# Patient Record
Sex: Male | Born: 1945 | Race: White | Hispanic: No | Marital: Single | Smoking: Never smoker
Health system: Southern US, Community
[De-identification: ages and names within clinical notes are randomized; demographics above are authoritative.]

## PROBLEM LIST (undated history)

## (undated) DIAGNOSIS — C4491 Basal cell carcinoma of skin, unspecified: Secondary | ICD-10-CM

## (undated) DIAGNOSIS — I2510 Atherosclerotic heart disease of native coronary artery without angina pectoris: Secondary | ICD-10-CM

## (undated) DIAGNOSIS — M109 Gout, unspecified: Secondary | ICD-10-CM

## (undated) DIAGNOSIS — I10 Essential (primary) hypertension: Secondary | ICD-10-CM

## (undated) DIAGNOSIS — E785 Hyperlipidemia, unspecified: Secondary | ICD-10-CM

## (undated) HISTORY — DX: Hyperlipidemia, unspecified: E78.5

## (undated) HISTORY — DX: Essential (primary) hypertension: I10

## (undated) HISTORY — DX: Gout, unspecified: M10.9

## (undated) HISTORY — DX: Atherosclerotic heart disease of native coronary artery without angina pectoris: I25.10

---
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
## 1986-01-04 HISTORY — PX: CARDIAC CATHETERIZATION: SHX172

## 2003-10-03 ENCOUNTER — Emergency Department (HOSPITAL_COMMUNITY): Admission: EM | Admit: 2003-10-03 | Discharge: 2003-10-03 | Payer: Self-pay | Admitting: Emergency Medicine

## 2009-10-07 DIAGNOSIS — C4491 Basal cell carcinoma of skin, unspecified: Secondary | ICD-10-CM

## 2009-10-07 HISTORY — DX: Basal cell carcinoma of skin, unspecified: C44.91

## 2011-07-06 ENCOUNTER — Other Ambulatory Visit: Payer: Self-pay | Admitting: Family Medicine

## 2011-07-06 DIAGNOSIS — M545 Low back pain, unspecified: Secondary | ICD-10-CM

## 2011-07-14 ENCOUNTER — Ambulatory Visit
Admission: RE | Admit: 2011-07-14 | Discharge: 2011-07-14 | Disposition: A | Discharge status: Home / Self Care | Payer: 59 | Source: Ambulatory Visit | Attending: Family Medicine | Admitting: Family Medicine

## 2011-07-14 DIAGNOSIS — M545 Low back pain, unspecified: Secondary | ICD-10-CM

## 2011-08-01 ENCOUNTER — Emergency Department (HOSPITAL_COMMUNITY)
Admission: EM | Admit: 2011-08-01 | Discharge: 2011-08-01 | Disposition: A | Discharge status: Home / Self Care | Payer: 59 | Attending: Emergency Medicine | Admitting: Emergency Medicine

## 2013-04-25 ENCOUNTER — Other Ambulatory Visit (HOSPITAL_COMMUNITY): Payer: Self-pay | Admitting: Cardiovascular Disease

## 2013-04-25 DIAGNOSIS — I25810 Atherosclerosis of coronary artery bypass graft(s) without angina pectoris: Secondary | ICD-10-CM

## 2013-04-25 DIAGNOSIS — R011 Cardiac murmur, unspecified: Secondary | ICD-10-CM

## 2013-05-03 ENCOUNTER — Ambulatory Visit (HOSPITAL_COMMUNITY)
Admission: RE | Admit: 2013-05-03 | Discharge: 2013-05-03 | Disposition: A | Discharge status: Home / Self Care | Payer: 59 | Source: Ambulatory Visit | Attending: Cardiovascular Disease | Admitting: Cardiovascular Disease

## 2013-05-03 DIAGNOSIS — I25810 Atherosclerosis of coronary artery bypass graft(s) without angina pectoris: Secondary | ICD-10-CM

## 2013-05-03 DIAGNOSIS — R011 Cardiac murmur, unspecified: Secondary | ICD-10-CM | POA: Insufficient documentation

## 2013-05-03 DIAGNOSIS — I2510 Atherosclerotic heart disease of native coronary artery without angina pectoris: Secondary | ICD-10-CM | POA: Insufficient documentation

## 2013-05-03 NOTE — Progress Notes (Signed)
2D Echo Performed 05/03/2013    Jamorion Gomillion, RCS  

## 2013-05-04 ENCOUNTER — Encounter: Payer: Self-pay | Admitting: Cardiovascular Disease

## 2013-08-11 ENCOUNTER — Other Ambulatory Visit: Payer: Self-pay | Admitting: Orthopedic Surgery

## 2013-08-11 DIAGNOSIS — M25532 Pain in left wrist: Secondary | ICD-10-CM

## 2013-08-15 ENCOUNTER — Ambulatory Visit
Admission: RE | Admit: 2013-08-15 | Discharge: 2013-08-15 | Disposition: A | Discharge status: Home / Self Care | Payer: 59 | Source: Ambulatory Visit | Attending: Orthopedic Surgery | Admitting: Orthopedic Surgery

## 2013-08-15 DIAGNOSIS — M25532 Pain in left wrist: Secondary | ICD-10-CM

## 2014-05-01 ENCOUNTER — Other Ambulatory Visit: Payer: Self-pay

## 2014-05-01 MED ORDER — EZETIMIBE 10 MG PO TABS: 10.0000 mg | ORAL_TABLET | Freq: Every day | ORAL | Status: DC

## 2014-05-01 NOTE — Telephone Encounter (Signed)
Rx was sent to pharmacy electronically. Patient's last office visit - 04/25/2013 with Dr Rollene Fare.

## 2014-07-04 ENCOUNTER — Other Ambulatory Visit: Payer: Self-pay | Admitting: Cardiovascular Disease

## 2014-07-05 NOTE — Telephone Encounter (Signed)
Rx was sent to pharmacy electronically. Last OV 04/2013 with Dr. Rollene Fare

## 2014-08-30 ENCOUNTER — Other Ambulatory Visit: Payer: Self-pay | Admitting: *Deleted

## 2014-08-30 MED ORDER — ROSUVASTATIN CALCIUM 40 MG PO TABS: 40.0000 mg | ORAL_TABLET | Freq: Every day | ORAL | Status: DC

## 2014-08-30 NOTE — Telephone Encounter (Signed)
Medication electronically refilled. 

## 2014-09-05 ENCOUNTER — Encounter: Payer: Self-pay | Admitting: Cardiovascular Disease

## 2014-09-05 ENCOUNTER — Ambulatory Visit (INDEPENDENT_AMBULATORY_CARE_PROVIDER_SITE_OTHER): Payer: 59 | Admitting: Cardiovascular Disease

## 2014-09-05 VITALS — BP 132/90 | HR 71 | Ht 68.0 in | Wt 223.0 lb

## 2014-09-05 DIAGNOSIS — I2583 Coronary atherosclerosis due to lipid rich plaque: Secondary | ICD-10-CM

## 2014-09-05 DIAGNOSIS — E785 Hyperlipidemia, unspecified: Secondary | ICD-10-CM

## 2014-09-05 DIAGNOSIS — I2510 Atherosclerotic heart disease of native coronary artery without angina pectoris: Secondary | ICD-10-CM

## 2014-09-05 DIAGNOSIS — E669 Obesity, unspecified: Secondary | ICD-10-CM

## 2014-09-05 DIAGNOSIS — E7849 Other hyperlipidemia: Secondary | ICD-10-CM

## 2014-09-05 DIAGNOSIS — E782 Mixed hyperlipidemia: Secondary | ICD-10-CM

## 2014-09-05 DIAGNOSIS — I10 Essential (primary) hypertension: Secondary | ICD-10-CM

## 2014-09-05 DIAGNOSIS — M1000 Idiopathic gout, unspecified site: Secondary | ICD-10-CM

## 2014-09-05 MED ORDER — NITROGLYCERIN 0.4 MG SL SUBL: 0.4000 mg | SUBLINGUAL_TABLET | SUBLINGUAL | Status: DC | PRN

## 2014-09-05 MED ORDER — ROSUVASTATIN CALCIUM 40 MG PO TABS: 40.0000 mg | ORAL_TABLET | Freq: Every day | ORAL | Status: DC

## 2014-09-05 MED ORDER — NIACIN ER (ANTIHYPERLIPIDEMIC) 1000 MG PO TBCR: 2000.0000 mg | EXTENDED_RELEASE_TABLET | Freq: Every day | ORAL | Status: DC

## 2014-09-05 MED ORDER — EZETIMIBE 10 MG PO TABS
10.0000 mg | ORAL_TABLET | Freq: Every day | ORAL | Status: DC
Start: 2014-09-05 — End: 2015-08-19

## 2014-09-05 NOTE — Patient Instructions (Signed)
Your physician recommends that you return for fasting lab work in: December.  Your physician has requested that you have an exercise stress myoview. For further information please visit [email or web address] will be scheduled in December.  Your physician recommends that you schedule a follow-up appointment in: January 2016.

## 2014-09-08 DIAGNOSIS — I2510 Atherosclerotic heart disease of native coronary artery without angina pectoris: Secondary | ICD-10-CM | POA: Insufficient documentation

## 2014-09-08 DIAGNOSIS — E7849 Other hyperlipidemia: Secondary | ICD-10-CM | POA: Insufficient documentation

## 2014-09-08 DIAGNOSIS — M109 Gout, unspecified: Secondary | ICD-10-CM | POA: Insufficient documentation

## 2014-09-08 DIAGNOSIS — E669 Obesity, unspecified: Secondary | ICD-10-CM | POA: Insufficient documentation

## 2014-09-08 NOTE — Progress Notes (Signed)
Patient ID: [name] [name], male   DOB: [birth year], 68 y.o.   MRN: [id number]     PATIENT PROFILE: [name] [name] is a 68 y.o. male who presents to the office to establish cardiology care with me.  He is a former patient of Dr. Rollene Fare and last saw Dr. 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   HPI:  [name] [name] is a 68 y.o. male has a long-standing history of familial hyperlipidemia.  His father had familial hyperlipidemia as does one of his brothers.  His father died at age 40 secondary to advanced coronary artery disease.  [name] [name] is cholesterols were in excess of 400 .  He was initially diagnosed in the 1970s.  He tells me that he participated in numerous drug studies over the years and was one of the precipitants in the initial statin trials with Mevacor.  As part of one of his studies, he did undergo diagnostic cardiac catheterization in [place].  He was asymptomatic with chest pain but was found to have mild coronary obstructive disease with 20-40% RCA stenoses, 20-30% circumflex stenoses percent LAD stenosis with poststenotic dilatation involving the diagonal and septal perforating artery in 1987.  He has not been on medical therapy for CAD, with the exception of aggressive lipid-lowering treatment.  His last nuclear perfusion study was in 2012, which continue to show normal perfusion.  There was attenuation artifact.  Post-rest ejection fraction was 53%.  The patient tells me he has been aggressive with his follow-up.  He typically has laboratory checked via life extension on his own.  Remotely, he also underwent VAP testing to determine particles size and numbers.  Most recently he has been on Zetia 10 mg Crestor 40 mg, Niaspan 2000 mg and 2000 mg of omega-3 fatty acids.  I did review blood work that he had had in April 2015 from life extension and at that time on aggressive medical therapy.  His total cholesterol was 185, triglycerides 59, HDL cholesterol 82, LDL  cholesterol 91.  Homocysteine was 11.7.  Hemoglobin A1c was 5.7.  Thyroid function studies were normal.  He was not anemic.  He had normal renal function.  He tells me he is currently participating in a form quest study to look at the side effects of the pen injection for possible PCS canine inhibitor.  A 90 day safety study looking at affect at the infiltration site.  He states there is a chance he is getting placebo, one third chance of getting half dose and one third chance of getting full dose PCS canine inhibitor.  He does not know what drug he is taking.  His last injection is next week.  In June 2014.  He did undergo an echo Doppler study which showed an ejection fraction of 55-60%.  Normal diastolic parameters.  He had mild left atrial dilatation.  There was mild aortic valve sclerosis without stenosis.  He tells me his brother recently underwent stenting of his coronary artery age 38.  The patient also has a history of gout, and hypertension.  He has been tolerating my Cardis 80 mg without side effects.  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  He presents for evaluation.  He denies chest pain.  He denies PND, orthopnea.  He denies palpitations.  Past Medical History  Diagnosis Date  . Hypertension   . Hyperlipidemia   . Coronary artery disease     No past surgical history on file.  No Known  Allergies  Current Outpatient Prescriptions  Medication Sig Dispense Refill  . allopurinol (ZYLOPRIM) 300 MG tablet Take 1 tablet by mouth daily.      Marland Kitchen aspirin 325 MG tablet Take 325 mg by mouth daily.      . Cholecalciferol (VITAMIN D3) 10000 UNITS capsule Take 10,000 Units by mouth daily.      . Coenzyme Q10 (COQ10) 100 MG CAPS Take 1 capsule by mouth daily.      Marland Kitchen COLCRYS 0.6 MG tablet Take 1 tablet by mouth as needed.      . ezetimibe (ZETIA) 10 MG tablet Take 1 tablet (10 mg total) by mouth daily.  90 tablet  3  . Glucosamine-Chondroitin (GLUCOSAMINE CHONDR COMPLEX PO) Take 1 capsule  by mouth daily.      . nabumetone (RELAFEN) 500 MG tablet Take 1 tablet by mouth 2 (two) times daily.      . niacin (NIASPAN) 1000 MG CR tablet Take 2 tablets (2,000 mg total) by mouth at bedtime.  180 tablet  3  . nitroGLYCERIN (NITROSTAT) 0.4 MG SL tablet Place 1 tablet (0.4 mg total) under the tongue every 5 (five) minutes as needed for chest pain.  25 tablet  6  . Omega-3 Fatty Acids (OMEGA 3 PO) Take 2 capsules by mouth daily.      . rosuvastatin (CRESTOR) 40 MG tablet Take 1 tablet (40 mg total) by mouth daily.  90 tablet  3  . telmisartan (MICARDIS) 80 MG tablet Take 1 tablet by mouth daily.       No current facility-administered medications for this visit.    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  He is married and has 2 children, ages 96 and 39.  He has 2 grandchildren.  There is no history of tobacco use.  He does not drink alcohol.  Family history is notable that his mother died at age 72 with old age.  Father died at age 16 with advanced atherosclerosis.  He has one brother is status post coronary stenting.  ROS General: Negative; No fevers, chills, or night sweats HEENT: Negative; No changes in vision or hearing, sinus congestion, difficulty swallowing Pulmonary: Negative; No cough, wheezing, shortness of breath, hemoptysis Cardiovascular:  See HPI; No chest pain, presyncope, syncope, palpitations, edema GI: Negative; No nausea, vomiting, diarrhea, or abdominal pain GU: Negative; No dysuria, hematuria, or difficulty voiding Musculoskeletal: Negative; no myalgias, joint pain, or weakness Hematologic/Oncologic: Negative; no easy bruising, bleeding Neurologic: Positive for gout Endocrine: Negative; no heat/cold intolerance; no diabetes Neuro: Negative; no changes in balance, headaches Skin: Osteophytic for chronic rash  Psychiatric: Negative; No behavioral problems, depression Sleep: Negative; No daytime sleepiness,  hypersomnolence, bruxism, restless legs, hypnogagnic hallucinations Other comprehensive 14 point system review is negative   Physical Exam BP 132/90  Pulse 71  Ht 5\' 8"  (1.727 m)  Wt 223 lb (101.152 kg)  BMI 33.91 kg/m2 Mild obesity General: Alert, oriented, no distress.  Skin: normal turgor, no rashes, warm and dry HEENT: Normocephalic, atraumatic. Pupils equal round and reactive to light; sclera anicteric; extraocular muscles intact; Fundi normal vessels.  No hemorrhages or exudates.  Disks flat Nose without nasal septal hypertrophy Mouth/Parynx benign; Mallinpatti scale 2 Neck: No JVD, no carotid bruits; normal carotid upstroke Lungs: clear to ausculatation and percussion; no wheezing or rales Chest wall: without tenderness to palpitation Heart: PMI not displaced, RRR, s1 s2 normal, 1/6 systolic murmur, no diastolic murmur, no rubs, gallops, thrills, or  heaves Abdomen: soft, nontender; no hepatosplenomehaly, BS+; abdominal aorta nontender and not dilated by palpation. Back: no CVA tenderness Pulses 2+ Musculoskeletal: full range of motion, normal strength, no joint deformities Extremities: no clubbing cyanosis or edema, Homan's sign negative  Neurologic: grossly nonfocal; Cranial nerves grossly wnl Psychologic: Normal mood and affect   ECG (independently read by me): Normal sinus rhythm at 71 bpm.  No ectopy.  QTc interval 441 ms  LABS:  BMET No results found for this basename: na, k, cl, co2, glucose, bun, creatinine, calcium, gfrnonaa, gfraa     Hepatic Function Panel  No results found for this basename: prot, albumin, ast, alt, alkphos, bilitot, bilidir, ibili     CBC No results found for this basename: wbc, rbc, hgb, hct, plt, mcv, mch, mchc, rdw, neutrabs, lymphsabs, monoabs, eosabs, basosabs     BNP No results found for this basename: probnp    Lipid Panel  No results found for this basename: chol, trig, hdl, cholhdl, vldl, ldlcalc, ldldirect       RADIOLOGY: No results found.   ASSESSMENT AND PLAN: M, history r. Jahari Billy is a 68 year old gentleman who has a history of probable familial hyperlipidemia and was initially noted to have cholesterols in excess of 400 in the 1970s.  His father and brother also has similar history.  He states his 2 children do not have this disorder.  He has been on aggressive lipid-lowering therapy ever since.  Statins were initially introduced and had been participating in the early clinical trials.  Over the past several months, he has been participating in a form request trial and there is a possibility that he may be getting either a half dose or a full dose PCS canine inhibitor to assess for side effects due to the pen injection.  His last dose will be next week.  The most recent blood work was obtained in April prior to participating in that study.  I have recommended follow-up blood work be obtained in at least several months after his last injection so that we can re-ascertain where he is with reference to his lipid status.  He may very well be a candidate for PCS canine therapy on top of his multiple drug regimen since he does have established card artery disease and he may not able to be able to get at goal less than 70 without additional treatment.  His last stress test was 3-1/2 years ago.  I have recommended that we obtain an exercise Myoview study further evaluate his documented CAD, and I will schedule this for January.  In January when he gets his blood work.  We will also do NMR lipoprotein in addition to chemistry, CBC, TSH levels to assess particle size and number.  I will see him in the office in follow-up of the testing and further recommendations will be made at that time.  Time spent: 45 minutes   Troy Sine, MD, [facility] 09/08/2014 1:31 PM

## 2014-11-20 ENCOUNTER — Encounter: Payer: Self-pay | Admitting: Cardiovascular Disease

## 2014-11-28 ENCOUNTER — Other Ambulatory Visit: Payer: Self-pay | Admitting: Cardiovascular Disease

## 2014-11-28 NOTE — Telephone Encounter (Signed)
crestor refills #90 with 3 refills 09/05/14

## 2014-11-30 ENCOUNTER — Other Ambulatory Visit: Payer: Self-pay | Admitting: Cardiovascular Disease

## 2014-12-02 NOTE — Telephone Encounter (Signed)
Rx(s) sent to pharmacy electronically.  

## 2014-12-07 LAB — NMR, LIPOPROFILE
Cholesterol: 185 mg/dL (ref 100–199)
HDL Cholesterol by NMR: 94 mg/dL (ref >39)
HDL PARTICLE NUMBER: 41.4 umol/L (ref >=30.5)
LDL PARTICLE NUMBER: 683 nmol/L (ref <1000)
LDL SIZE: 21 nm (ref >20.5)
LDL-C: 77 mg/dL (ref 0–99)
LP-IR Score: 31 (ref <=45)
TRIGLYCERIDES BY NMR: 72 mg/dL (ref 0–149)

## 2014-12-07 LAB — CBC
HCT: 40.8 % (ref 37.5–51.0)
HEMOGLOBIN: 13.9 g/dL (ref 12.6–17.7)
MCH: 32.9 pg (ref 26.6–33.0)
MCHC: 34.1 g/dL (ref 31.5–35.7)
MCV: 97 fL (ref 79–97)
PLATELETS: 150 10*3/uL (ref 150–379)
RBC: 4.22 x10E6/uL (ref 4.14–5.80)
RDW: 15.1 % (ref 12.3–15.4)
WBC: 6.8 10*3/uL (ref 3.4–10.8)

## 2014-12-07 LAB — COMPREHENSIVE METABOLIC PANEL
ALT: 20 IU/L (ref 0–44)
AST: 28 IU/L (ref 0–40)
Albumin/Globulin Ratio: 2.2 (ref 1.1–2.5)
Albumin: 4.8 g/dL (ref 3.6–4.8)
Alkaline Phosphatase: 53 IU/L (ref 39–117)
BILIRUBIN TOTAL: 0.4 mg/dL (ref 0.0–1.2)
BUN/Creatinine Ratio: 17 (ref 10–22)
BUN: 17 mg/dL (ref 8–27)
CALCIUM: 9.6 mg/dL (ref 8.6–10.2)
CO2: 24 mmol/L (ref 18–29)
Chloride: 97 mmol/L (ref 97–108)
Creatinine, Ser: 0.98 mg/dL (ref 0.76–1.27)
GFR calc non Af Amer: 78 mL/min/{1.73_m2} (ref >59)
GFR, EST AFRICAN AMERICAN: 91 mL/min/{1.73_m2} (ref >59)
GLOBULIN, TOTAL: 2.2 g/dL (ref 1.5–4.5)
Glucose: 80 mg/dL (ref 65–99)
Potassium: 4.7 mmol/L (ref 3.5–5.2)
Sodium: 138 mmol/L (ref 134–144)
TOTAL PROTEIN: 7 g/dL (ref 6.0–8.5)

## 2014-12-07 LAB — LIPID PANEL
CHOLESTEROL TOTAL: 185 mg/dL (ref 100–199)
Chol/HDL Ratio: 2 ratio units (ref 0.0–5.0)
HDL: 92 mg/dL (ref >39)
LDL Calculated: 79 mg/dL (ref 0–99)
TRIGLYCERIDES: 72 mg/dL (ref 0–149)
VLDL CHOLESTEROL CAL: 14 mg/dL (ref 5–40)

## 2014-12-07 LAB — TSH: TSH: 2.84 u[IU]/mL (ref 0.450–4.500)

## 2014-12-13 ENCOUNTER — Encounter: Payer: Self-pay | Admitting: Cardiovascular Disease

## 2015-01-03 NOTE — Telephone Encounter (Signed)
Patient's caregiver walked in office stating she needed Dr.Kelly to sign form for upcoming hip surgery.After reviewing chart patient needs stress myoview and follow up.Myoview and follow up to be scheduled.

## 2015-01-11 ENCOUNTER — Telehealth (HOSPITAL_COMMUNITY): Payer: Self-pay

## 2015-01-11 NOTE — Telephone Encounter (Signed)
Encounter complete. 

## 2015-01-16 ENCOUNTER — Ambulatory Visit (HOSPITAL_COMMUNITY)
Admission: RE | Admit: 2015-01-16 | Discharge: 2015-01-16 | Disposition: A | Discharge status: Home / Self Care | Payer: 59 | Source: Ambulatory Visit | Attending: Internal Medicine | Admitting: Internal Medicine

## 2015-01-16 DIAGNOSIS — I10 Essential (primary) hypertension: Secondary | ICD-10-CM

## 2015-01-16 DIAGNOSIS — I2510 Atherosclerotic heart disease of native coronary artery without angina pectoris: Secondary | ICD-10-CM

## 2015-01-16 MED ORDER — TECHNETIUM TC 99M SESTAMIBI GENERIC - CARDIOLITE: 30.2000 | Freq: Once | INTRAVENOUS | Status: AC | PRN

## 2015-01-16 MED ORDER — TECHNETIUM TC 99M SESTAMIBI GENERIC - CARDIOLITE: 10.8000 | Freq: Once | INTRAVENOUS | Status: AC | PRN

## 2015-01-16 NOTE — Procedures (Addendum)
[place] [address] [place] [address] [phone]  Cardiology Nuclear Med Study  [name] [name] is a 69 y.o. male     MRN : [id number]     DOB: [birth year]  Procedure Date: 01/16/2015  Nuclear Med Background Indication for Stress Test:  Surgical Clearance and Follow up CAD History:  CAD;Last NUC MPI on 05/27/2011-not in Epic;Mild aortic stenosis. Cardiac Risk Factors: Family History - CAD, Hypertension, Lipids and Obesity  Symptoms:  Pt denies all symptoms at this time.   Nuclear Pre-Procedure Caffeine/Decaff Intake:  8:00pm NPO After: 6:00am   IV Site: R Forearm  IV 0.9% NS with Angio Cath:  22g  Chest Size (in):  46" IV Started by: Rolene Course, RN  Height: 5\' 8"  (1.727 m)  Cup Size: n/a  BMI:  Body mass index is 33.91 kg/(m^2). Weight:  223 lb (101.152 kg)   Tech Comments:  n/a    Nuclear Med Study 1 or 2 day study: 1 day  Stress Test Type:  Stress  Order Authorizing Provider:  Shelva Majestic, MD   Resting Radionuclide: Technetium 28m Sestamibi  Resting Radionuclide Dose: 10.8 mCi   Stress Radionuclide:  Technetium 16m Sestamibi  Stress Radionuclide Dose: 30.2 mCi           Stress Protocol Rest HR: 71 Stress HR: 131  Rest BP: 123/82 Stress BP: 150/90  Exercise Time (min): 7:51 METS: 8.30   Predicted Max HR: 151 bpm % Max HR: 86.75 bpm Rate Pressure Product: 19650  Dose of Adenosine (mg):  n/a Dose of Lexiscan: n/a mg  Dose of Atropine (mg): n/a Dose of Dobutamine: n/a mcg/kg/min (at max HR)  Stress Test Technologist: Mellody Memos, CCT Nuclear Technologist: Imagene Riches, CNMT   Rest Procedure:  Myocardial perfusion imaging was performed at rest 45 minutes following the intravenous administration of Technetium 38m Sestamibi. Stress Procedure:  The patient performed treadmill exercise using a Bruce  Protocol for 7 minutes 51 seconds. The patient stopped due to fatigue and shortness of breath.  Patient  denied any chest pain.  There were no significant ST-T wave changes.  Technetium 25m Sestamibi was injected  IV at peak exercise and myocardial perfusion imaging was performed after a brief delay.  Transient Ischemic Dilatation (Normal <1.22):  0.96  QGS EDV:  96 ml QGS ESV:  45 ml LV Ejection Fraction: 53%  Rest ECG: NSR - Normal EKG  Stress ECG: <29mm inferior ST depression  QPS Raw Data Images:  Mild diaphragmatic attenuation.  Normal left ventricular size. Stress Images:  There is decreased uptake in the inferior wall. Rest Images:  There is decreased uptake in the inferior wall. Subtraction (SDS):  There is a fixed inferior defect that is most consistent with diaphragmatic attenuation. SDS 2  Impression Exercise Capacity:  Good exercise capacity. BP Response:  Normal blood pressure response. Clinical Symptoms:  No significant symptoms noted. ECG Impression:  No significant ST segment change suggestive of ischemia. Comparison with Prior Nuclear Study: No significant change from previous study  Overall Impression:  Low risk stress nuclear study with small, mild mostly fixed (SDS 2) inferior defect suggestive of bowel attenuation artifact.  LV Wall Motion:  NL LV Function; NL Wall Motion; EF 53%  [name], MD, [facility] Board Certified in Nuclear Cardiology Attending Cardiologist [place], MD  01/16/2015 12:11 PM

## 2015-01-23 ENCOUNTER — Encounter: Payer: Self-pay | Admitting: Cardiovascular Disease

## 2015-01-28 ENCOUNTER — Ambulatory Visit: Payer: Self-pay | Admitting: Cardiology

## 2015-01-30 ENCOUNTER — Encounter: Payer: Self-pay | Admitting: Cardiology

## 2015-01-30 ENCOUNTER — Ambulatory Visit (INDEPENDENT_AMBULATORY_CARE_PROVIDER_SITE_OTHER): Payer: 59 | Admitting: Cardiology

## 2015-01-30 VITALS — BP 114/80 | HR 88 | Ht 68.0 in | Wt 231.7 lb

## 2015-01-30 DIAGNOSIS — E785 Hyperlipidemia, unspecified: Secondary | ICD-10-CM | POA: Diagnosis not present

## 2015-01-30 DIAGNOSIS — Z01818 Encounter for other preprocedural examination: Secondary | ICD-10-CM

## 2015-01-30 NOTE — Patient Instructions (Signed)
Your physician recommends that you schedule a follow-up appointment in: 6 months with Dr. Kelly. 

## 2015-01-30 NOTE — Progress Notes (Signed)
.    01/30/2015 [name] [name]   06-28-1946  [id number]  Primary Physician Gennette Pac, MD Primary Cardiologist: Dr. Claiborne Billings  Reason for visit/CC: Preoperative clearance  HPI:  The patient is a 69 year old male formerly followed by Dr. Rollene Fare and now followed by Dr. Claiborne Billings. He presents to clinic for pre-operative assessment/clearance for planned left hip resurfacing, scheduled to be performed in [facility]. He has a long-standing history of familial hyperlipidemia. His lipid profile is followed by Dr. Claiborne Billings and now well controlled. He also has a history of nonobstructive CAD. He underwent cardiac catheterization remotely in [place] which demonstrated 20-40% RCA stenoses and 20-30% circumflex stenoses in 1987. His most recent 2D echo was 04/2013 which demonstrated normal LVF with EF of 55-60%. He was recently seen by Dr. Claiborne Billings 09/05/14 for routine evaluation. He ordered for him to undergo routine nuclear stress testing to assess for ischemia. The test was recently completed 01/16/15. It was interpreted as a low risk stress nuclear study with small, mild mostly fixed (SDS 2) inferior defect suggestive of bowel attenuation artifact. There was NL LV Function and NL Wall Motion; EF 53%.  He presents back for physical exam and to review his test results. He denies CP, dyspnea, lightheadedness, dizziness, syncope/ near syncope. He reports full medication compliance.     Current Outpatient Prescriptions  Medication Sig Dispense Refill  . allopurinol (ZYLOPRIM) 300 MG tablet Take 1 tablet by mouth daily.    Marland Kitchen aspirin 325 MG tablet Take 325 mg by mouth daily.    . Cholecalciferol (VITAMIN D3) 10000 UNITS capsule Take 10,000 Units by mouth daily.    . Coenzyme Q10 (COQ10) 100 MG CAPS Take 1 capsule by mouth daily.    Marland Kitchen COLCRYS 0.6 MG tablet Take 1 tablet by mouth as needed.    . ezetimibe (ZETIA) 10 MG tablet Take 1 tablet (10 mg total) by mouth daily. PATIENT NEEDS AN APPOINTMENT FOR FUTURE REFILLS. 90  tablet 0  . ezetimibe (ZETIA) 10 MG tablet Take 1 tablet (10 mg total) by mouth daily. 90 tablet 3  . Glucosamine-Chondroitin (GLUCOSAMINE CHONDR COMPLEX PO) Take 1 capsule by mouth daily.    . nabumetone (RELAFEN) 500 MG tablet Take 1 tablet by mouth 2 (two) times daily.    . niacin (NIASPAN) 1000 MG CR tablet Take 2 tablets (2,000 mg total) by mouth at bedtime. 180 tablet 3  . nitroGLYCERIN (NITROSTAT) 0.4 MG SL tablet Place 1 tablet (0.4 mg total) under the tongue every 5 (five) minutes as needed for chest pain. 25 tablet 6  . Omega-3 Fatty Acids (OMEGA 3 PO) Take 2 capsules by mouth daily.    . rosuvastatin (CRESTOR) 40 MG tablet Take 1 tablet (40 mg total) by mouth daily. 90 tablet 1  . telmisartan (MICARDIS) 80 MG tablet Take 1 tablet by mouth daily.     No current facility-administered medications for this visit.    No Known Allergies  History   Social History  . Marital Status: Married    Spouse Name: N/A  . Number of Children: N/A  . Years of Education: N/A   Occupational History  . Not on file.   Social History Main Topics  . Smoking status: Never Smoker   . Smokeless tobacco: Never Used  . Alcohol Use: 2.5 oz/week    5 drink(s) per week     Comment: Scothc or wine  . Drug Use: Not on file  . Sexual Activity: Not on file   Other Topics Concern  .  Not on file   Social History Narrative     Review of Systems: General: negative for chills, fever, night sweats or weight changes.  Cardiovascular: negative for chest pain, dyspnea on exertion, edema, orthopnea, palpitations, paroxysmal nocturnal dyspnea or shortness of breath Dermatological: negative for rash Respiratory: negative for cough or wheezing Urologic: negative for hematuria Abdominal: negative for nausea, vomiting, diarrhea, bright red blood per rectum, melena, or hematemesis Neurologic: negative for visual changes, syncope, or dizziness All other systems reviewed and are otherwise negative except as  noted above.    Blood pressure 114/80, pulse 88, height 5\' 8"  (1.727 m), weight 231 lb 11.2 oz (105.098 kg).  General appearance: alert, cooperative and no distress Neck: no carotid bruit and no JVD Lungs: clear to auscultation bilaterally Heart: regular rate and rhythm, S1, S2 normal, no murmur, click, rub or gallop Extremities: no LEE Pulses: 2+ and symmetric Skin: warm and dry Neurologic: Grossly normal  EKG Not performed  ASSESSMENT AND PLAN:   1. CAD: mild nonobstructive on remote cath. Recent NST 01/2015 negative for ischemia. No CP. Continue medical therapy.   2. HLD: controlled on statin therapy. Continue routine lipid testing per Dr. Claiborne Billings.  3. Pre-operative Clearance: Physical exam benign. No symptoms. NST w/o ischemia. He has been cleared for hip surgery. We recommend continuation of statin therapy during the perioperative period. He has been advised to resume ASA once cleared by surgeon.    PLAN  OK for surgery. Continue medical therapy as prescribed. F/U with Dr. Claiborne Billings in 3- 6 months.   Majour Frei, BRITTAINYPA-C 01/30/2015 9:14 AM

## 2015-05-05 ENCOUNTER — Other Ambulatory Visit: Payer: Self-pay | Admitting: Cardiovascular Disease

## 2015-07-22 ENCOUNTER — Other Ambulatory Visit: Payer: Self-pay | Admitting: Cardiovascular Disease

## 2015-08-03 ENCOUNTER — Other Ambulatory Visit: Payer: Self-pay | Admitting: Cardiovascular Disease

## 2015-08-18 ENCOUNTER — Other Ambulatory Visit: Payer: Self-pay | Admitting: Cardiovascular Disease

## 2015-08-19 ENCOUNTER — Other Ambulatory Visit: Payer: Self-pay

## 2015-08-19 MED ORDER — EZETIMIBE 10 MG PO TABS: 10.0000 mg | ORAL_TABLET | Freq: Every day | ORAL | Status: DC

## 2015-09-03 ENCOUNTER — Ambulatory Visit (INDEPENDENT_AMBULATORY_CARE_PROVIDER_SITE_OTHER): Payer: 59 | Admitting: Cardiovascular Disease

## 2015-09-03 ENCOUNTER — Encounter: Payer: Self-pay | Admitting: Cardiovascular Disease

## 2015-09-03 VITALS — BP 114/80 | HR 62 | Ht 68.0 in | Wt 203.0 lb

## 2015-09-03 DIAGNOSIS — E785 Hyperlipidemia, unspecified: Secondary | ICD-10-CM | POA: Diagnosis not present

## 2015-09-03 DIAGNOSIS — E7849 Other hyperlipidemia: Secondary | ICD-10-CM

## 2015-09-03 DIAGNOSIS — I2510 Atherosclerotic heart disease of native coronary artery without angina pectoris: Secondary | ICD-10-CM

## 2015-09-03 DIAGNOSIS — Z8249 Family history of ischemic heart disease and other diseases of the circulatory system: Secondary | ICD-10-CM | POA: Diagnosis not present

## 2015-09-03 DIAGNOSIS — I2583 Coronary atherosclerosis due to lipid rich plaque: Principal | ICD-10-CM

## 2015-09-03 MED ORDER — NITROGLYCERIN 0.4 MG SL SUBL: 0.4000 mg | SUBLINGUAL_TABLET | SUBLINGUAL | Status: DC | PRN

## 2015-09-03 MED ORDER — ROSUVASTATIN CALCIUM 40 MG PO TABS: 40.0000 mg | ORAL_TABLET | Freq: Every day | ORAL | Status: DC

## 2015-09-03 MED ORDER — NIACIN ER (ANTIHYPERLIPIDEMIC) 1000 MG PO TBCR: 1000.0000 mg | EXTENDED_RELEASE_TABLET | Freq: Two times a day (BID) | ORAL | Status: DC

## 2015-09-03 MED ORDER — NIACIN ER (ANTIHYPERLIPIDEMIC) 1000 MG PO TBCR: 1000.0000 mg | EXTENDED_RELEASE_TABLET | Freq: Every day | ORAL | Status: DC

## 2015-09-03 MED ORDER — EZETIMIBE 10 MG PO TABS: 10.0000 mg | ORAL_TABLET | Freq: Every day | ORAL | Status: DC

## 2015-09-03 NOTE — Patient Instructions (Addendum)
Your physician has recommended you make the following change in your medication: the crestor has been sent into your pharmacy as generic. The niaspan has been decreased to 1 tablet  a day.  Your physician recommends that you return for lab work in: 3 months fasting. The forms have been provided to you today.  Your physician wants you to follow-up in: 1 year or sooner if needed. You will receive a reminder letter in the mail two months in advance. If you don't receive a letter, please call our office to schedule the follow-up appointment.   If you need a refill on your cardiac medications before your next appointment, please call your pharmacy.

## 2015-09-05 ENCOUNTER — Encounter: Payer: Self-pay | Admitting: Cardiovascular Disease

## 2015-09-05 DIAGNOSIS — Z8249 Family history of ischemic heart disease and other diseases of the circulatory system: Secondary | ICD-10-CM | POA: Insufficient documentation

## 2015-09-05 NOTE — Progress Notes (Signed)
Patient ID: [name] [name], male   DOB: [birth year], 69 y.o.   MRN: [id number]     HPI:  [name] [name] is a 69 y.o. male who is a former patient of Dr. Rollene Fare.  I saw him one year ago when he established care with me.  He presents for follow-up evaluation.  [name] [name] has a long-standing history of familial hyperlipidemia.  His father and one of his brothers had familial hyperlipidemia.   His father died at age 11 secondary to advanced coronary artery disease.  [name] [name] cholesterols in the past had been as high as 400.  He was initially diagnosed in the 1970s.  He has participated in numerous drug studies over the years and was one of the precipitants in the initial statin trials with Mevacor.  As part of one of his studies, he underwent diagnostic cardiac catheterization in [place].  He was asymptomatic with chest pain but was found to have mild coronary obstructive disease with 20-40% RCA stenoses, 20-30% circumflex stenoses percent LAD stenosis with poststenotic dilatation involving the diagonal and septal perforating artery in 1987.  He has not been on medical therapy for CAD, with the exception of aggressive lipid-lowering treatment.  His last nuclear perfusion study was in 2012, which continue to show normal perfusion.  There was attenuation artifact.  Post-rest ejection fraction was 53%. In June 2014 an echo Doppler study  showed an ejection fraction of 55-60%; Normal diastolic parameters.  He had mild left atrial dilatation.  There was mild aortic valve sclerosis without stenosis.  The patient tells me he has been aggressive with his follow-up.  He typically has laboratory checked via life extension on his own. Remotely, he also underwent VAP testing to determine particles size and numbers.  H e has been on Zetia 10 mg Crestor 40 mg, Niaspan 2000 mg and 2000 mg of omega-3 fatty acids.  I did review blood work that he had had in April 2015 from life extension and at that time on aggressive  medical therapy.  His total cholesterol was 185, triglycerides 59, HDL cholesterol 82, LDL cholesterol 91.  Homocysteine was 11.7.  Hemoglobin A1c was 5.7.  Thyroid function studies were normal.  He was not anemic.  He had normal renal function.  He participated in this study looking at possible side effects from the injection site for PCSK9 inhibitor therapy.  He tells me his brother recently underwent stenting of his coronary artery age 89.  The patient also has a history of gout, and hypertension.  He has been tolerating my Micardis 80 mg without side effects.  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  HHe denies chest pain.  He denies PND, orthopnea.  He denies palpitations.  In February 2016 he had an NMR LipoProfile.  Total cholesterol was 185, triglycerides 72, HDL 94, LDL C 77, and LDL particle number was excellent at 683.  Insulin resistance score was 31.  In March 2016.  He underwent a nuclear perfusion study, which continue to show normal perfusion and was low risk with probable bowel attenuation artifact.  He tells me as part of his own blood testing he had another lipid panel in August 2016 which showed a total cholesterol 140, HDL 50, LDL 77, and very normal triglyceride levels.  He has been on a diet since June and has lost 30 pounds.  He tells me he had undergone hip surgery by Dr. Lonzo [name] in [place], [place].  He remains active.  He presents  for evaluation.  Past Medical History  Diagnosis Date  . Hypertension   . Hyperlipidemia   . Coronary artery disease   . Gout     Of the left third, fourth, and fifth MTPareas with swelling of left foot and pain    Past Surgical History  Procedure Laterality Date  . Cardiac catheterization  01/04/1986    No Known Allergies  Current Outpatient Prescriptions  Medication Sig Dispense Refill  . allopurinol (ZYLOPRIM) 300 MG tablet Take 1 tablet by mouth daily.    Marland Kitchen aspirin 325 MG tablet Take 325 mg by mouth daily.    .  Cholecalciferol (VITAMIN D3) 10000 UNITS capsule Take 10,000 Units by mouth daily.    . Coenzyme Q10 (COQ10) 100 MG CAPS Take 1 capsule by mouth daily.    Marland Kitchen COLCRYS 0.6 MG tablet Take 1 tablet by mouth as needed.    . ezetimibe (ZETIA) 10 MG tablet Take 1 tablet (10 mg total) by mouth daily. 90 tablet 3  . Glucosamine-Chondroitin (GLUCOSAMINE CHONDR COMPLEX PO) Take 1 capsule by mouth daily.    . niacin (NIASPAN) 1000 MG CR tablet Take 1 tablet (1,000 mg total) by mouth at bedtime. 180 tablet 3  . nitroGLYCERIN (NITROSTAT) 0.4 MG SL tablet Place 1 tablet (0.4 mg total) under the tongue every 5 (five) minutes as needed for chest pain. 25 tablet 3  . Omega-3 Fatty Acids (OMEGA 3 PO) Take 2 capsules by mouth daily.    Marland Kitchen telmisartan (MICARDIS) 80 MG tablet Take 1 tablet by mouth daily.    . rosuvastatin (CRESTOR) 40 MG tablet Take 1 tablet (40 mg total) by mouth daily. 90 tablet 3   No current facility-administered medications for this visit.    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  He is married and has 2 children, ages 27 and 27.  He has 2 grandchildren.  There is no history of tobacco use.  He does not drink alcohol.  Family history is notable that his mother died at age 20 with old age.  Father died at age 56 with advanced atherosclerosis.  He has one brother is status post coronary stenting.  ROS General: Negative; No fevers, chills, or night sweats HEENT: Negative; No changes in vision or hearing, sinus congestion, difficulty swallowing Pulmonary: Negative; No cough, wheezing, shortness of breath, hemoptysis Cardiovascular:  See HPI; No chest pain, presyncope, syncope, palpitations, edema GI: Negative; No nausea, vomiting, diarrhea, or abdominal pain GU: Negative; No dysuria, hematuria, or difficulty voiding Musculoskeletal: Negative; no myalgias, joint pain, or weakness Hematologic/Oncologic: Negative; no easy bruising,  bleeding Neurologic: Positive for gout Endocrine: Negative; no heat/cold intolerance; no diabetes Neuro: Negative; no changes in balance, headaches Skin: Osteophytic for chronic rash  Psychiatric: Negative; No behavioral problems, depression Sleep: Negative; No daytime sleepiness, hypersomnolence, bruxism, restless legs, hypnogagnic hallucinations Other comprehensive 14 point system review is negative   Physical Exam BP 114/80 mmHg  Pulse 62  Ht 5' 8" (1.727 m)  Wt 203 lb (92.08 kg)  BMI 30.87 kg/m2   Wt Readings from Last 3 Encounters:  09/03/15 203 lb (92.08 kg)  01/30/15 231 lb 11.2 oz (105.098 kg)  01/16/15 223 lb (101.152 kg)    General: Alert, oriented, no distress.  Skin: normal turgor, no rashes, warm and dry HEENT: Normocephalic, atraumatic. Pupils equal round and reactive to light; sclera anicteric; extraocular muscles intact; Fundi normal vessels.  No hemorrhages or exudates.  Disks flat Nose without  nasal septal hypertrophy Mouth/Parynx benign; Mallinpatti scale 2 Neck: No JVD, no carotid bruits; normal carotid upstroke Lungs: clear to ausculatation and percussion; no wheezing or rales Chest wall: without tenderness to palpitation Heart: PMI not displaced, RRR, s1 s2 normal, 1/6 systolic murmur, no diastolic murmur, no rubs, gallops, thrills, or heaves Abdomen: soft, nontender; no hepatosplenomehaly, BS+; abdominal aorta nontender and not dilated by palpation. Back: no CVA tenderness Pulses 2+ Musculoskeletal: full range of motion, normal strength, no joint deformities Extremities: no clubbing cyanosis or edema, Homan's sign negative  Neurologic: grossly nonfocal; Cranial nerves grossly wnl Psychologic: Normal mood and affect  ECG (independently read by me): Normal sinus rhythm at 62.  No ectopy.  No ST segment changes.   ECG (independently read by me): Normal sinus rhythm at 71 bpm.  No ectopy.  QTc interval 441 ms  LABS: BMP Latest Ref Rng 12/06/2014   Glucose 65 - 99 mg/dL 80  BUN 8 - 27 mg/dL 17  Creatinine 0.76 - 1.27 mg/dL 0.98  BUN/Creat Ratio 10 - 22 17  Sodium 134 - 144 mmol/L 138  Potassium 3.5 - 5.2 mmol/L 4.7  Chloride 97 - 108 mmol/L 97  CO2 18 - 29 mmol/L 24  Calcium 8.6 - 10.2 mg/dL 9.6   Hepatic Function Latest Ref Rng 12/06/2014  Total Protein 6.0 - 8.5 g/dL 7.0  Albumin 3.6 - 4.8 g/dL 4.8  AST 0 - 40 IU/L 28  ALT 0 - 44 IU/L 20  Alk Phosphatase 39 - 117 IU/L 53  Total Bilirubin 0.0 - 1.2 mg/dL 0.4   CBC Latest Ref Rng 12/06/2014  WBC 3.4 - 10.8 x10E3/uL 6.8  Hemoglobin 12.6 - 17.7 g/dL 13.9  Hematocrit 37.5 - 51.0 % 40.8  Platelets 150 - 379 x10E3/uL 150   Lab Results  Component Value Date   MCV 97 12/06/2014   Lab Results  Component Value Date   TSH 2.840 12/06/2014   Lipid Panel     Component Value Date/Time   CHOL 185 12/06/2014 0944   CHOL 185 12/06/2014 0944   TRIG 72 12/06/2014 0944   TRIG 72 12/06/2014 0944   HDL 94 12/06/2014 0944   HDL 92 12/06/2014 0944   CHOLHDL 2.0 12/06/2014 0944   LDLCALC 79 12/06/2014 0944   RADIOLOGY: No results found.   ASSESSMENT AND PLAN: Mr. Khalin Royce is a 69 year old gentleman who has a history of probable familial hyperlipidemia and was initially noted to have cholesterols in excess of 400 in the 1970s.  His father and brother also has similar history.  He states his 2 children do not have this disorder.  He has been on aggressive lipid-lowering therapy ever since.  Statins were initially introduced and had been participating in the early clinical trials.  Since I last saw him, he underwent a four-year follow-up nuclear perfusion study, which continue to be low risk and did not reveal any significant perfusion abnormalities.  He continues to be on aggressive lipid-lowering therapy including Zetia 10 mg, Crestor 40 mg, Niaspan 2000 mg, and omega-3 fatty acids.  I reviewed his NMR lipid profile from January.  I have suggested he try reducing his Niaspan to  1000 mg.  I also suggested he reduce his aspirin to 81 mg.  I commended him on his weight loss of 30 pounds since June.  He is exercising regularly now that his hip has been repaired.  In 3 months, he will undergo a one-year follow-up NMR profile and comp its of metabolic panel.  I will see him in one year for cardiology reevaluation or sooner if needed.  Time spent: 25 minutes   Troy Sine, MD, [facility] 09/05/2015 6:26 PM

## 2015-12-20 ENCOUNTER — Telehealth: Payer: Self-pay | Admitting: *Deleted

## 2015-12-20 DIAGNOSIS — E785 Hyperlipidemia, unspecified: Secondary | ICD-10-CM

## 2015-12-20 DIAGNOSIS — I2583 Coronary atherosclerosis due to lipid rich plaque: Secondary | ICD-10-CM

## 2015-12-20 DIAGNOSIS — I2510 Atherosclerotic heart disease of native coronary artery without angina pectoris: Secondary | ICD-10-CM

## 2015-12-20 DIAGNOSIS — I25119 Atherosclerotic heart disease of native coronary artery with unspecified angina pectoris: Secondary | ICD-10-CM

## 2015-12-20 NOTE — Telephone Encounter (Signed)
[name] needed Cardio IQ panel reordered for crossover into [place] [facility] order system. This was taken care of.

## 2015-12-21 LAB — COMPREHENSIVE METABOLIC PANEL
ALT: 34 U/L (ref 9–46)
AST: 40 U/L — ABNORMAL HIGH (ref 10–35)
Albumin: 3.9 g/dL (ref 3.6–5.1)
Alkaline Phosphatase: 38 U/L — ABNORMAL LOW (ref 40–115)
BUN: 11 mg/dL (ref 7–25)
CHLORIDE: 101 mmol/L (ref 98–110)
CO2: 31 mmol/L (ref 20–31)
Calcium: 9.2 mg/dL (ref 8.6–10.3)
Creat: 0.84 mg/dL (ref 0.70–1.18)
Glucose, Bld: 93 mg/dL (ref 65–99)
Potassium: 4.7 mmol/L (ref 3.5–5.3)
Sodium: 140 mmol/L (ref 135–146)
Total Bilirubin: 0.6 mg/dL (ref 0.2–1.2)
Total Protein: 5.7 g/dL — ABNORMAL LOW (ref 6.1–8.1)

## 2015-12-23 LAB — CARDIO IQ(R) ADVANCED LIPID PANEL
Apolipoprotein B: 61 mg/dL (ref 52–109)
Cholesterol, Total: 153 mg/dL (ref 125–200)
Cholesterol/HDL Ratio: 2 calc (ref <=5.0)
HDL Cholesterol: 76 mg/dL (ref >=40)
LDL Large: 8876 nmol/L (ref 4334–10815)
LDL Medium: 215 nmol/L (ref 167–465)
LDL Particle Number: 1241 nmol/L (ref 1016–2185)
LDL Peak Size: 213.3 Angstrom — ABNORMAL LOW (ref >=218.2)
LDL Small: 196 nmol/L (ref 123–441)
LDL, Calculated: 68 mg/dL
Lipoprotein (a): 14 nmol/L (ref <75)
Non-HDL Cholesterol: 77 mg/dL
Triglycerides: 45 mg/dL

## 2015-12-31 LAB — CARDIO IQ ADV LIPID AND INFLAMM PNL
Apolipoprotein B: 61 mg/dL (ref 52–109)
Cholesterol, Total: 160 mg/dL (ref 125–200)
Cholesterol/HDL Ratio: 2 calc (ref <=5.0)
HDL Cholesterol: 81 mg/dL (ref >=40)
LDL Large: 6083 nmol/L (ref 4334–10815)
LDL Medium: 142 nmol/L — ABNORMAL LOW (ref 167–465)
LDL Particle Number: 894 nmol/L — ABNORMAL LOW (ref 1016–2185)
LDL Peak Size: 218.7 Angstrom (ref >=218.2)
LDL Small: 150 nmol/L (ref 123–441)
LDL, Calculated: 69 mg/dL
Lipoprotein (a): 11 nmol/L (ref <75)
Lp-PLA2 Activity: 93 nmol/min/mL (ref 70–153)
Non-HDL Cholesterol: 79 mg/dL
Triglycerides: 50 mg/dL
hs-CRP: 1.1 mg/L

## 2015-12-31 LAB — CARDIO IQ ADV LIPID AND INFLAM PNL
Cholesterol, Total: 160 mg/dL (ref 125–200)
Cholesterol/HDL Ratio: 2 calc (ref <=5.0)
HDL Cholesterol: 81 mg/dL (ref >=40)
LDL Large: 6083 nmol/L (ref 4334–10815)
LDL Medium: 142 nmol/L — ABNORMAL LOW (ref 167–465)
LDL Particle Number: 894 nmol/L — ABNORMAL LOW (ref 1016–2185)
LDL Peak Size: 218.7 Angstrom (ref >=218.2)
LDL Small: 150 nmol/L (ref 123–441)
LDL, Calculated: 69 mg/dL
Non-HDL Cholesterol: 79 mg/dL
Triglycerides: 50 mg/dL
hs-CRP: 1.1 mg/L

## 2016-08-14 ENCOUNTER — Other Ambulatory Visit: Payer: Self-pay | Admitting: Cardiovascular Disease

## 2016-09-02 ENCOUNTER — Ambulatory Visit (INDEPENDENT_AMBULATORY_CARE_PROVIDER_SITE_OTHER): Payer: 59 | Admitting: Cardiovascular Disease

## 2016-09-02 ENCOUNTER — Encounter: Payer: Self-pay | Admitting: Cardiovascular Disease

## 2016-09-02 VITALS — BP 120/78 | HR 70 | Ht 68.0 in | Wt 198.4 lb

## 2016-09-02 DIAGNOSIS — E7849 Other hyperlipidemia: Secondary | ICD-10-CM

## 2016-09-02 DIAGNOSIS — E559 Vitamin D deficiency, unspecified: Secondary | ICD-10-CM

## 2016-09-02 DIAGNOSIS — Z8739 Personal history of other diseases of the musculoskeletal system and connective tissue: Secondary | ICD-10-CM

## 2016-09-02 DIAGNOSIS — E784 Other hyperlipidemia: Secondary | ICD-10-CM

## 2016-09-02 DIAGNOSIS — I2583 Coronary atherosclerosis due to lipid rich plaque: Secondary | ICD-10-CM | POA: Diagnosis not present

## 2016-09-02 DIAGNOSIS — I2510 Atherosclerotic heart disease of native coronary artery without angina pectoris: Secondary | ICD-10-CM | POA: Diagnosis not present

## 2016-09-02 DIAGNOSIS — Z8249 Family history of ischemic heart disease and other diseases of the circulatory system: Secondary | ICD-10-CM

## 2016-09-02 MED ORDER — NITROGLYCERIN 0.4 MG SL SUBL: 0.4000 mg | SUBLINGUAL_TABLET | SUBLINGUAL | 3 refills | Status: DC | PRN

## 2016-09-02 MED ORDER — NIACIN ER (ANTIHYPERLIPIDEMIC) 1000 MG PO TBCR
1000.0000 mg | EXTENDED_RELEASE_TABLET | Freq: Every day | ORAL | 3 refills | Status: DC
Start: 2016-09-02 — End: 2017-09-24

## 2016-09-02 MED ORDER — EZETIMIBE 10 MG PO TABS: 10.0000 mg | ORAL_TABLET | Freq: Every day | ORAL | 3 refills | Status: DC

## 2016-09-02 MED ORDER — ROSUVASTATIN CALCIUM 40 MG PO TABS: 40.0000 mg | ORAL_TABLET | Freq: Every day | ORAL | 3 refills | Status: DC

## 2016-09-02 NOTE — Patient Instructions (Signed)
Your physician wants you to follow-up in: 1 year or sooner if needed. You will receive a reminder letter in the mail two months in advance. If you don't receive a letter, please call our office to schedule the follow-up appointment.  Your medications have been refilled to your mail order pharmacy. Please call and notify us if you do not receive your refills.

## 2016-09-02 NOTE — Progress Notes (Signed)
Patient ID: [name] [name], male   DOB: Nov 25, 1945, 70 y.o.   MRN: [id number]     HPI:  [name] [name] is a 70 y.o. male who is a former patient of Dr. Rollene Fare.  He presents for one-year follow-up cardiology evaluation.  [name] [name] has a long-standing history of familial hyperlipidemia.  His father and one of his brothers had familial hyperlipidemia.   His father died at age 7 secondary to advanced coronary artery disease.  [name] [name] cholesterols in the past had been as high as 400.  He was initially diagnosed in the 1970s.  He has participated in numerous drug studies over the years and was one of the precipitants in the initial statin trials with Mevacor.  As part of one of his studies, he underwent diagnostic cardiac catheterization in [place].  He was asymptomatic with chest pain but was found to have mild coronary obstructive disease with 20-40% RCA stenoses, 20-30% circumflex stenoses percent LAD stenosis with poststenotic dilatation involving the diagonal and septal perforating artery in 1987.  He has not been on medical therapy for CAD, with the exception of aggressive lipid-lowering treatment.  His last nuclear perfusion study was in 2012, which continue to show normal perfusion.  There was attenuation artifact.  Post-rest ejection fraction was 53%. In June 2014 an echo Doppler study  showed an ejection fraction of 55-60%; Normal diastolic parameters.  He had mild left atrial dilatation.  There was mild aortic valve sclerosis without stenosis.  The patient tells me he has been aggressive with his follow-up.  He typically has laboratory checked via life extension on his own. Remotely, he also underwent VAP testing to determine particles size and numbers.  H e has been on Zetia 10 mg Crestor 40 mg, Niaspan 2000 mg and 2000 mg of omega-3 fatty acids.  I did review blood work that he had had in April 2015 from life extension and at that time on aggressive medical therapy.  His total cholesterol  was 185, triglycerides 59, HDL cholesterol 82, LDL cholesterol 91.  Homocysteine was 11.7.  Hemoglobin A1c was 5.7.  Thyroid function studies were normal.  He was not anemic.  He had normal renal function.  He participated in this study looking at possible side effects from the injection site for PCSK9 inhibitor therapy.  He tells me his brother recently underwent stenting of his coronary artery age 56.  The patient also has a history of gout, and hypertension.  He has been tolerating my Micardis 80 mg without side effects.  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  HHe denies chest pain.  He denies PND, orthopnea.  He denies palpitations.  In February 2016 he had an NMR LipoProfile.  Total cholesterol was 185, triglycerides 72, HDL 94, LDL C 77, and LDL particle number was excellent at 683.  Insulin resistance score was 31.  In March 2016  a nuclear perfusion study continued  to show normal perfusion and was low risk with probable bowel attenuation artifact.  As part of his own blood testing he had another lipid panel in August 2016 which showed a total cholesterol 140, HDL 50, LDL 77, and very normal triglyceride levels.  He has been on a diet since June and has lost 30 pounds.  He tells me he had undergone hip surgery by Dr. Lonzo Cloud in [place], [place].  He underwent an NMR LipoProfile on 12/20/2015.  Total cholesterol is 153, HDL 76, triglycerides 45, and calculated LDL was 68.  He had 1241, LDL particles which were predominantly large.  There were only 196 small LDL particles.  April lipoprotein B was normal at 61 as was LPa at 14.  He again had his labs rechecked as part of life extension, blood testing in June 2017.  He has a remote history of gout and has been taking a you.  All and uric acid was 3.3.  Renal function was normal.  He had normal LFTs.  Lipid studies revealed a total cholesterol of 167, triglycerides 46, HDL 85, and LDL 73.  Homocysteine was normal at 9.5.  PSA was 0.8.   Testosterone was low at 297.  Hemoglobin A1c was excellent at 5.3.  TSH was normal.  He was not anemic with hemoglobin of 13.5, hematocrit of 42.3.  MCV was minimally increased at 102.  Vitamin D level was normal at 95.7.  He had been taking vitamin D 10,000 units daily supplementation  Over the past year he remains active.  He plays golf 3 days a week.  He walks several other days.  He continues to work as a Engineer, [employer].  He is widowed and not sexually active.  He presents for evaluation.  Past Medical History:  Diagnosis Date  . Coronary artery disease   . Gout    Of the left third, fourth, and fifth MTPareas with swelling of left foot and pain  . Hyperlipidemia   . Hypertension     Past Surgical History:  Procedure Laterality Date  . CARDIAC CATHETERIZATION  01/04/1986    No Known Allergies  Current Outpatient Prescriptions  Medication Sig Dispense Refill  . allopurinol (ZYLOPRIM) 300 MG tablet Take 1 tablet by mouth daily.    Marland Kitchen aspirin 325 MG tablet Take 325 mg by mouth daily.    . Cholecalciferol (VITAMIN D3) 10000 UNITS capsule Take 10,000 Units by mouth daily.    . Coenzyme Q10 (COQ10) 100 MG CAPS Take 1 capsule by mouth daily.    Marland Kitchen COLCRYS 0.6 MG tablet Take 1 tablet by mouth as needed.    . ezetimibe (ZETIA) 10 MG tablet TAKE 1 TABLET BY MOUTH  DAILY 90 tablet 0  . Glucosamine-Chondroitin (GLUCOSAMINE CHONDR COMPLEX PO) Take 1 capsule by mouth daily.    . niacin (NIASPAN) 1000 MG CR tablet Take 1 tablet (1,000 mg total) by mouth at bedtime. 180 tablet 3  . nitroGLYCERIN (NITROSTAT) 0.4 MG SL tablet Place 1 tablet (0.4 mg total) under the tongue every 5 (five) minutes as needed for chest pain. 25 tablet 3  . Omega-3 Fatty Acids (OMEGA 3 PO) Take 2 capsules by mouth daily.    . rosuvastatin (CRESTOR) 40 MG tablet Take 1 tablet (40 mg total) by mouth daily. 90 tablet 3  . telmisartan (MICARDIS) 80 MG tablet Take 1 tablet by mouth daily.     No current  facility-administered medications for this visit.     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  He is married and has 2 children, ages 30 and 34.  He has 2 grandchildren.  There is no history of tobacco use.  He does not drink alcohol.  Family history is notable that his mother died at age 12 with old age.  Father died at age 16 with advanced atherosclerosis.  He has one brother is status post coronary stenting.  ROS General: Negative; No fevers, chills, or night sweats HEENT: Negative; No changes in vision or hearing, sinus  congestion, difficulty swallowing Pulmonary: Negative; No cough, wheezing, shortness of breath, hemoptysis Cardiovascular:  See HPI; No chest pain, presyncope, syncope, palpitations, edema GI: Negative; No nausea, vomiting, diarrhea, or abdominal pain GU: Negative; No dysuria, hematuria, or difficulty voiding Musculoskeletal: Negative; no myalgias, joint pain, or weakness Hematologic/Oncologic: Negative; no easy bruising, bleeding Neurologic: Positive for gout Endocrine: Negative; no heat/cold intolerance; no diabetes Neuro: Negative; no changes in balance, headaches Skin: Osteophytic for chronic rash  Psychiatric: Negative; No behavioral problems, depression Sleep: Negative; No daytime sleepiness, hypersomnolence, bruxism, restless legs, hypnogagnic hallucinations Other comprehensive 14 point system review is negative   Physical Exam There were no vitals taken for this visit.   Wt Readings from Last 3 Encounters:  09/03/15 203 lb (92.1 kg)  01/30/15 231 lb 11.2 oz (105.1 kg)  01/16/15 223 lb (101.2 kg)    General: Alert, oriented, no distress.  Skin: normal turgor, no rashes, warm and dry HEENT: Normocephalic, atraumatic. Pupils equal round and reactive to light; sclera anicteric; extraocular muscles intact; Fundi normal vessels.  No hemorrhages or exudates.  Disks flat Nose without nasal septal  hypertrophy Mouth/Parynx benign; Mallinpatti scale 2 Neck: No JVD, no carotid bruits; normal carotid upstroke Lungs: clear to ausculatation and percussion; no wheezing or rales Chest wall: without tenderness to palpitation Heart: PMI not displaced, RRR, s1 s2 normal, 1/6 systolic murmur, no diastolic murmur, no rubs, gallops, thrills, or heaves Abdomen: soft, nontender; no hepatosplenomehaly, BS+; abdominal aorta nontender and not dilated by palpation. Back: no CVA tenderness Pulses 2+ Musculoskeletal: full range of motion, normal strength, no joint deformities Extremities: no clubbing cyanosis or edema, Homan's sign negative  Neurologic: grossly nonfocal; Cranial nerves grossly wnl Psychologic: Normal mood and affect  ECG (independently read by me):  October 2016 ECG (independently read by me): Normal sinus rhythm at 62.  No ectopy.  No ST segment changes.  ECG (independently read by me): Normal sinus rhythm at 71 bpm.  No ectopy.  QTc interval 441 ms  LABS: BMP Latest Ref Rng & Units 12/20/2015 12/06/2014  Glucose 65 - 99 mg/dL 93 80  BUN 7 - 25 mg/dL 11 17  Creatinine 0.70 - 1.18 mg/dL 0.84 0.98  BUN/Creat Ratio 10 - 22 - 17  Sodium 135 - 146 mmol/L 140 138  Potassium 3.5 - 5.3 mmol/L 4.7 4.7  Chloride 98 - 110 mmol/L 101 97  CO2 20 - 31 mmol/L 31 24  Calcium 8.6 - 10.3 mg/dL 9.2 9.6   Hepatic Function Latest Ref Rng & Units 12/20/2015 12/06/2014  Total Protein 6.1 - 8.1 g/dL 5.7(L) 7.0  Albumin 3.6 - 5.1 g/dL 3.9 4.8  AST 10 - 35 U/L 40(H) 28  ALT 9 - 46 U/L 34 20  Alk Phosphatase 40 - 115 U/L 38(L) 53  Total Bilirubin 0.2 - 1.2 mg/dL 0.6 0.4   CBC Latest Ref Rng & Units 12/06/2014  WBC 3.4 - 10.8 x10E3/uL 6.8  Hemoglobin 12.6 - 17.7 g/dL 13.9  Hematocrit 37.5 - 51.0 % 40.8  Platelets 150 - 379 x10E3/uL 150   Lab Results  Component Value Date   MCV 97 12/06/2014   Lab Results  Component Value Date   TSH 2.840 12/06/2014   Lipid Panel     Component Value  Date/Time   CHOL 153 12/20/2015 0949   TRIG 45 12/20/2015 0949   TRIG 72 12/06/2014 0944   HDL 76 12/20/2015 0949   HDL 94 12/06/2014 0944   CHOLHDL 2.0 12/20/2015 0949   LDLCALC 68  12/20/2015 0949   RADIOLOGY: No results found.   ASSESSMENT AND PLAN: [name] [name] is a 70 year old gentleman who has a history of probable familial hyperlipidemia and was initially noted to have cholesterols in excess of 400 in the 1970s.  His father and brother also has similar history.  He states his 2 children do not have this disorder.  He has been on aggressive lipid-lowering therapy ever since statins were initially introduced and had been participating in the early clinical trials of lovastatin and simvastatin.  In 2016 prior to undergoing hip surgery he underwent a four-year follow-up nuclear perfusion study which continued to be low risk and did not reveal any significant perfusion abnormalities.  He continues to be on aggressive lipid-lowering therapy including Zetia 10 mg, Crestor 40 mg, Niaspan 1000 mg, and omega-3 fatty acids.I saw him last year.  I recommended he reduce his Niaspan to 1000 mg.  I also suggested that he may not need this anymore since his lipid studies have remained excellent, but he.  For her to continue to take this therapy.  His blood pressure today is well controlled on Micardis 80 mg.  I have recommended he reduce his aspirin from 325 mg to 81 mg daily.  He level is upper normal and I suggested he reduce his vitamin D supplementation from 10,000 units to 5000 units daily.  He continues to take allopurinol for his history of gout but has not had recent problems.  As long as he remains stable I will see him in one year for reevaluation.    Time spent: 25 minutes   Troy Sine, MD, [facility] 09/02/2016 8:13 AM

## 2017-01-07 DIAGNOSIS — M1612 Unilateral primary osteoarthritis, left hip: Secondary | ICD-10-CM | POA: Diagnosis not present

## 2017-01-07 DIAGNOSIS — M1611 Unilateral primary osteoarthritis, right hip: Secondary | ICD-10-CM | POA: Diagnosis not present

## 2017-03-02 DIAGNOSIS — M5136 Other intervertebral disc degeneration, lumbar region: Secondary | ICD-10-CM | POA: Diagnosis not present

## 2017-03-02 DIAGNOSIS — M48061 Spinal stenosis, lumbar region without neurogenic claudication: Secondary | ICD-10-CM | POA: Diagnosis not present

## 2017-03-02 DIAGNOSIS — M5412 Radiculopathy, cervical region: Secondary | ICD-10-CM | POA: Diagnosis not present

## 2017-04-12 DIAGNOSIS — M1611 Unilateral primary osteoarthritis, right hip: Secondary | ICD-10-CM | POA: Diagnosis not present

## 2017-05-14 DIAGNOSIS — Z0000 Encounter for general adult medical examination without abnormal findings: Secondary | ICD-10-CM | POA: Diagnosis not present

## 2017-06-01 DIAGNOSIS — D229 Melanocytic nevi, unspecified: Secondary | ICD-10-CM | POA: Diagnosis not present

## 2017-06-01 DIAGNOSIS — D0472 Carcinoma in situ of skin of left lower limb, including hip: Secondary | ICD-10-CM | POA: Diagnosis not present

## 2017-06-01 DIAGNOSIS — D492 Neoplasm of unspecified behavior of bone, soft tissue, and skin: Secondary | ICD-10-CM | POA: Diagnosis not present

## 2017-06-01 DIAGNOSIS — L565 Disseminated superficial actinic porokeratosis (DSAP): Secondary | ICD-10-CM | POA: Diagnosis not present

## 2017-07-08 DIAGNOSIS — C44519 Basal cell carcinoma of skin of other part of trunk: Secondary | ICD-10-CM | POA: Diagnosis not present

## 2017-07-08 DIAGNOSIS — D0472 Carcinoma in situ of skin of left lower limb, including hip: Secondary | ICD-10-CM | POA: Diagnosis not present

## 2017-07-23 DIAGNOSIS — Z23 Encounter for immunization: Secondary | ICD-10-CM | POA: Diagnosis not present

## 2017-08-02 DIAGNOSIS — K5730 Diverticulosis of large intestine without perforation or abscess without bleeding: Secondary | ICD-10-CM | POA: Diagnosis not present

## 2017-08-02 DIAGNOSIS — Z800 Family history of malignant neoplasm of digestive organs: Secondary | ICD-10-CM | POA: Diagnosis not present

## 2017-08-02 DIAGNOSIS — Z1211 Encounter for screening for malignant neoplasm of colon: Secondary | ICD-10-CM | POA: Diagnosis not present

## 2017-09-03 DIAGNOSIS — M5136 Other intervertebral disc degeneration, lumbar region: Secondary | ICD-10-CM | POA: Diagnosis not present

## 2017-09-03 DIAGNOSIS — M4316 Spondylolisthesis, lumbar region: Secondary | ICD-10-CM | POA: Diagnosis not present

## 2017-09-03 DIAGNOSIS — M5030 Other cervical disc degeneration, unspecified cervical region: Secondary | ICD-10-CM | POA: Diagnosis not present

## 2017-09-24 ENCOUNTER — Other Ambulatory Visit: Payer: Self-pay | Admitting: Cardiovascular Disease

## 2017-10-09 ENCOUNTER — Other Ambulatory Visit: Payer: Self-pay | Admitting: Cardiovascular Disease

## 2017-10-11 ENCOUNTER — Other Ambulatory Visit: Payer: Self-pay | Admitting: Cardiovascular Disease

## 2017-10-12 ENCOUNTER — Ambulatory Visit: Payer: 59 | Admitting: Cardiovascular Disease

## 2017-10-12 ENCOUNTER — Encounter: Payer: Self-pay | Admitting: Cardiovascular Disease

## 2017-10-12 VITALS — BP 134/102 | HR 72 | Ht 68.0 in | Wt 216.8 lb

## 2017-10-12 DIAGNOSIS — Z8249 Family history of ischemic heart disease and other diseases of the circulatory system: Secondary | ICD-10-CM | POA: Diagnosis not present

## 2017-10-12 DIAGNOSIS — I2510 Atherosclerotic heart disease of native coronary artery without angina pectoris: Secondary | ICD-10-CM | POA: Diagnosis not present

## 2017-10-12 DIAGNOSIS — I2583 Coronary atherosclerosis due to lipid rich plaque: Secondary | ICD-10-CM | POA: Diagnosis not present

## 2017-10-12 DIAGNOSIS — E7849 Other hyperlipidemia: Secondary | ICD-10-CM

## 2017-10-12 DIAGNOSIS — Z8739 Personal history of other diseases of the musculoskeletal system and connective tissue: Secondary | ICD-10-CM

## 2017-10-12 DIAGNOSIS — I25119 Atherosclerotic heart disease of native coronary artery with unspecified angina pectoris: Secondary | ICD-10-CM

## 2017-10-12 MED ORDER — ROSUVASTATIN CALCIUM 40 MG PO TABS: 40.0000 mg | ORAL_TABLET | Freq: Every day | ORAL | 3 refills | Status: DC

## 2017-10-12 MED ORDER — EZETIMIBE 10 MG PO TABS: 10.0000 mg | ORAL_TABLET | Freq: Every day | ORAL | 3 refills | Status: DC

## 2017-10-12 MED ORDER — METOPROLOL SUCCINATE ER 25 MG PO TB24: 25.0000 mg | ORAL_TABLET | Freq: Every day | ORAL | 0 refills | Status: DC

## 2017-10-12 NOTE — Progress Notes (Signed)
Patient ID: [name] [name], male   DOB: 13-Oct-1946, 71 y.o.   MRN: [id number]     HPI:  [name] [name] is a 71 y.o. male who is a former patient of Dr. Rollene Fare.  He presents for 14 month  follow-up cardiology evaluation.  [name] [name] has a long-standing history of familial hyperlipidemia.  His father and one of his brothers had familial hyperlipidemia.   His father died at age 33 secondary to advanced coronary artery disease.  [name] [name] cholesterols in the past had been as high as 400.  He was initially diagnosed in the 1970s.  He has participated in numerous drug studies over the years and was one of the precipitants in the initial statin trials with Mevacor.  As part of one of his studies, he underwent diagnostic cardiac catheterization in [place].  He was asymptomatic with chest pain but was found to have mild coronary obstructive disease with 20-40% RCA stenoses, 20-30% circumflex stenoses,  At least moderate LAD stenosis with poststenotic dilatation involving the diagonal and septal perforating artery in 1987.  He has not been on medical therapy for CAD, with the exception of aggressive lipid-lowering treatment.  His last nuclear perfusion study was in 2012, which continue to show normal perfusion.  There was attenuation artifact.  Post-rest ejection fraction was 53%. In June 2014 an echo Doppler study  showed an ejection fraction of 55-60%; Normal diastolic parameters.  He had mild left atrial dilatation.  There was mild aortic valve sclerosis without stenosis.  The patient tells me he has been aggressive with his follow-up.  He typically has laboratory checked via life extension on his own. Remotely, he also underwent VAP testing to determine particles size and numbers.  H e has been on Zetia 10 mg Crestor 40 mg, Niaspan 2000 mg and 2000 mg of omega-3 fatty acids.  I did review blood work that he had had in April 2015 from life extension and at that time on aggressive medical therapy.  His total  cholesterol was 185, triglycerides 59, HDL cholesterol 82, LDL cholesterol 91.  Homocysteine was 11.7.  Hemoglobin A1c was 5.7.  Thyroid function studies were normal.  He was not anemic.  He had normal renal function.  He participated in this study looking at possible side effects from the injection site for PCSK9 inhibitor therapy.  He tells me his brother  underwent stenting of his coronary artery age 41.  The patient also has a history of gout, and hypertension.  He has been tolerating my Micardis 80 mg without side effects.  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  He denies chest pain.  He denies PND, orthopnea.  He denies palpitations.  In February 2016 he had an NMR LipoProfile.  Total cholesterol was 185, triglycerides 72, HDL 94, LDL C 77, and LDL particle number was excellent at 683.  Insulin resistance score was 31.  In March 2016  a nuclear perfusion study continued  to show normal perfusion and was low risk with probable bowel attenuation artifact.  As part of his own blood testing he had another lipid panel in August 2016 which showed a total cholesterol 140, HDL 50, LDL 77, and very normal triglyceride levels.  He had purposeful weight loss of 30 pounds.  He tells me he had undergone hip surgery by Dr. Lonzo Cloud in [place].  He underwent an NMR LipoProfile on 12/20/2015.  Total cholesterol is 153, HDL 76, triglycerides 45, and calculated LDL was 68.  He had 1241, LDL particles which were predominantly large.  There were only 196 small LDL particles.  April lipoprotein B was normal at 61 as was LPa at 14.  He again had his labs rechecked as part of life extension, blood testing in June 2017.  He has a remote history of gout and has been taking a you.  All and uric acid was 3.3.  Renal function was normal.  He had normal LFTs.  Lipid studies revealed a total cholesterol of 167, triglycerides 46, HDL 85, and LDL 73.  Homocysteine was normal at 9.5.  PSA was 0.8.   Testosterone was low at 297.  Hemoglobin A1c was excellent at 5.3.  TSH was normal.  He was not anemic with hemoglobin of 13.5, hematocrit of 42.3.  MCV was minimally increased at 102.  Vitamin D level was normal at 95.7.  He had been taking vitamin D 10,000 units daily supplementation  Since I last saw him one year ago, he has remained active.  Over the past year he remains active.  He plays golf at least 2 days per week.  days a week.  He walks several other days.  He continues to work as a Engineer, petroleum.  He is widowed and not sexually active.   He underwent follow-up laboratory on 04/14/2017 at life extension.  BUN 16, creatinine 0.96.  Total cholesterol 189, triglycerides 71, HDL 89, LDL 86.  Homocysteine 14.5.  C-reactive protein 2.1.  Hemoglobin 14.1/hematocrit 42.8.  Serum testosterone 196.  TSH 2.6.  Vitamin D 50.2.  Apo lipoprotein B 81.  He denies chest pain.  He denies palpitations.  He presents for evaluation.  Past Medical History:  Diagnosis Date  . Coronary artery disease   . Gout    Of the left third, fourth, and fifth MTPareas with swelling of left foot and pain  . Hyperlipidemia   . Hypertension     Past Surgical History:  Procedure Laterality Date  . CARDIAC CATHETERIZATION  01/04/1986    No Known Allergies  Current Outpatient Medications  Medication Sig Dispense Refill  . AFLURIA QUADRIVALENT 0.5 ML injection Inject as directed once.  0  . allopurinol (ZYLOPRIM) 300 MG tablet Take 1 tablet by mouth daily.    Marland Kitchen aspirin 325 MG tablet Take 325 mg by mouth daily.    . Cholecalciferol (VITAMIN D3) 10000 UNITS capsule Take 10,000 Units by mouth daily.    . Coenzyme Q10 (COQ10) 100 MG CAPS Take 1 capsule by mouth daily.    Marland Kitchen ezetimibe (ZETIA) 10 MG tablet Take 1 tablet (10 mg total) by mouth daily. 90 tablet 3  . Glucosamine-Chondroitin (GLUCOSAMINE CHONDR COMPLEX PO) Take 1 capsule by mouth daily.    . Influenza Vac Typ A&B Surf Ant 0.5 ML SUSY Inject  as directed once.    . Influenza Virus Vacc Split PF (AFLURIA PRESERVATIVE FREE) 0.5 ML SUSY Inject as directed once.    . nabumetone (RELAFEN) 500 MG tablet Take 500 mg by mouth 2 (two) times daily.    . nitroGLYCERIN (NITROSTAT) 0.4 MG SL tablet Place 1 tablet (0.4 mg total) under the tongue every 5 (five) minutes as needed for chest pain. 25 tablet 3  . Omega-3 Fatty Acids (OMEGA 3 PO) Take 2 capsules by mouth daily.    . rosuvastatin (CRESTOR) 40 MG tablet Take 1 tablet (40 mg total) by mouth daily. 90 tablet 3  . SHINGRIX injection Inject as directed once.  0  .  telmisartan (MICARDIS) 80 MG tablet Take 1 tablet by mouth daily.    . metoprolol succinate (TOPROL XL) 25 MG 24 hr tablet Take 1 tablet (25 mg total) by mouth daily. 30 tablet 0   No current facility-administered medications for this visit.     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  He is married and has 2 children, ages 71 and 41.  He has 2 grandchildren.  There is no history of tobacco use.  He does not drink alcohol.  Family history is notable that his mother died at age 67 with old age.  Father died at age 54 with advanced atherosclerosis.  He has one brother is status post coronary stenting.  ROS General: Negative; No fevers, chills, or night sweats HEENT: Negative; No changes in vision or hearing, sinus congestion, difficulty swallowing Pulmonary: Negative; No cough, wheezing, shortness of breath, hemoptysis Cardiovascular:  See HPI; No chest pain, presyncope, syncope, palpitations, edema GI: Negative; No nausea, vomiting, diarrhea, or abdominal pain GU: Negative; No dysuria, hematuria, or difficulty voiding Musculoskeletal: Negative; no myalgias, joint pain, or weakness Hematologic/Oncologic: Negative; no easy bruising, bleeding Neurologic: Positive for gout Endocrine: Negative; no heat/cold intolerance; no diabetes Neuro: Negative; no changes in balance,  headaches Skin: Osteophytic for chronic rash  Psychiatric: Negative; No behavioral problems, depression Sleep: Negative; No daytime sleepiness, hypersomnolence, bruxism, restless legs, hypnogagnic hallucinations Other comprehensive 14 point system review is negative   Physical Exam BP (!) 134/102   Pulse 72   Ht _0  (1.727 m)   Wt 216 lb 12.8 oz (98.3 kg)   SpO2 93%   BMI 32.96 kg/m    Repeat blood pressure by me 140/88.  Wt Readings from Last 3 Encounters:  10/12/17 216 lb 12.8 oz (98.3 kg)  09/02/16 198 lb 6.4 oz (90 kg)  09/03/15 203 lb (92.1 kg)    Physical Exam BP (!) 134/102   Pulse 72   Ht _1  (1.727 m)   Wt 216 lb 12.8 oz (98.3 kg)   SpO2 93%   BMI 32.96 kg/m  General: Alert, oriented, no distress.  Skin: normal turgor, no rashes, warm and dry HEENT: Normocephalic, atraumatic. Pupils equal round and reactive to light; sclera anicteric; extraocular muscles intact; no arcus senilis Nose without nasal septal hypertrophy Mouth/Parynx benign; Mallinpatti scale 2 Neck: No JVD, no carotid bruits; normal carotid upstroke Lungs: clear to ausculatation and percussion; no wheezing or rales Chest wall: without tenderness to palpitation Heart: PMI not displaced, RRR, s1 s2 normal, 1/6 systolic murmur, no diastolic murmur, no rubs, gallops, thrills, or heaves Abdomen: soft, nontender; no hepatosplenomehaly, BS+; abdominal aorta nontender and not dilated by palpation. Back: no CVA tenderness Pulses 2+ Musculoskeletal: full range of motion, normal strength, no joint deformities Extremities: no clubbing cyanosis or edema, Homan's sign negative  Neurologic: grossly nonfocal; Cranial nerves grossly wnl Psychologic: Normal mood and affect   ECG (independently read by me): Normal sinus rhythm at 72 bpm.  Early transition.  First-degree AV block with a PR interval of 206 ms.  October 2016 ECG (independently read by me): Normal sinus rhythm at 62.  No ectopy.  No ST segment  changes.  ECG (independently read by me): Normal sinus rhythm at 71 bpm.  No ectopy.  QTc interval 441 ms  LABS: BMP Latest Ref Rng & Units 12/20/2015 12/06/2014  Glucose 65 - 99 mg/dL 93 80  BUN 7 - 25 mg/dL 11 17  Creatinine 0.70 -  1.18 mg/dL 0.84 0.98  BUN/Creat Ratio 10 - 22 - 17  Sodium 135 - 146 mmol/L 140 138  Potassium 3.5 - 5.3 mmol/L 4.7 4.7  Chloride 98 - 110 mmol/L 101 97  CO2 20 - 31 mmol/L 31 24  Calcium 8.6 - 10.3 mg/dL 9.2 9.6   Hepatic Function Latest Ref Rng & Units 12/20/2015 12/06/2014  Total Protein 6.1 - 8.1 g/dL 5.7(L) 7.0  Albumin 3.6 - 5.1 g/dL 3.9 4.8  AST 10 - 35 U/L 40(H) 28  ALT 9 - 46 U/L 34 20  Alk Phosphatase 40 - 115 U/L 38(L) 53  Total Bilirubin 0.2 - 1.2 mg/dL 0.6 0.4   CBC Latest Ref Rng & Units 12/06/2014  WBC 3.4 - 10.8 x10E3/uL 6.8  Hemoglobin 12.6 - 17.7 g/dL 13.9  Hematocrit 37.5 - 51.0 % 40.8  Platelets 150 - 379 x10E3/uL 150   Lab Results  Component Value Date   MCV 97 12/06/2014   Lab Results  Component Value Date   TSH 2.840 12/06/2014   Lipid Panel     Component Value Date/Time   CHOL 153 12/20/2015 0949   TRIG 45 12/20/2015 0949   TRIG 72 12/06/2014 0944   HDL 76 12/20/2015 0949   HDL 94 12/06/2014 0944   CHOLHDL 2.0 12/20/2015 0949   LDLCALC 68 12/20/2015 0949   RADIOLOGY: No results found.  IMPRESSION:  1. Coronary artery disease due to lipid rich plaque   2. Familial hyperlipidemia   3. Family history of heart disease in male family member before age 67   4. Atherosclerosis of native coronary artery of native heart with angina pectoris ([place])   5. History of gout     ASSESSMENT AND PLAN: [name] [name] is a 71 year old gentleman who has a history of probable heterozygous familial hyperlipidemia and was initially noted to have cholesterols in excess of 400 in the 1970s.  His father and brother also has similar history.  He states his 2 children do not have this disorder.  He has been on aggressive  lipid-lowering therapy ever since statins were initially introduced and had been participating in the early clinical trials of lovastatin and simvastatin.  He has been demonstrated to have coronary atherosclerosis by cardiac catheterization in the 1980s.  In 2016 prior to undergoing hip surgery he underwent a four-year follow-up nuclear perfusion study which continued to be low risk and did not reveal any significant perfusion abnormalities.  When I saw him last year, I had reduced his Niaspan from 2000 mg down to 1000 mg.  He has continued to be on rosuvastatin 40 mg in addition to Zetia 10 mg and most recent lipid studies from June 2018 reveal a total cholesterol 189, triglycerides 71, HDL 89, and LDL 86.  I reviewed with him outcome data with for Repatha and in particular the [place] trial.  With his LDL greater than 70 on maximal therapy.  I feel he is a good candidate for initiation of Repatha.  We will initiate the approval process and after my evaluation, I had our Pharm.D. come into the room to talk with him regarding the initiation process.  His blood pressure today is elevated.  I discussed with him the change in the hypertensive guidelines since last November 2017.  I elected to add low-dose metoprolol succinate at 25 mg daily.  Since that time.  She also has noticed a short-lived chest flutter.  Also help blood pressure lability.  He continues to be on micardis 80  mg.  His gout has been stable.  He has not had any recurrence.  His triglyceride level did not significantly increase on his reduced dose of niacin.  I will discontinue niacin once he initiates Repatha.  In 3 months, he will undergo follow-up laboratory.  following initiation of Repatha, but I will recheck his lipid studies tomorrow since almost 6 months have elapsed since his last evaluation.  I will see him in 3-4 months for reevaluation or sooner problems arise.    Time spent: 30 minutes   Troy Sine, MD, [facility] 10/12/2017 6:51  PM

## 2017-10-12 NOTE — Patient Instructions (Addendum)
Medication Instructions:  START metoprolol succinate (Toprol XL) 25 mg daily  Once you start Repatha, STOP the Niacin.  Labs: Please return for FASTING labs (Lipid) tomorrow or Wednesday.  Follow-Up:  Your physician wants you to follow-up in: 4 months with Dr. Claiborne Billings. You will receive a reminder letter in the mail two months in advance. If you don't receive a letter, please call our office to schedule the follow-up appointment.   If you need a refill on your cardiac medications before your next appointment, please call your pharmacy.

## 2017-10-14 ENCOUNTER — Other Ambulatory Visit: Payer: Self-pay | Admitting: Pharmacist

## 2017-10-14 DIAGNOSIS — E7849 Other hyperlipidemia: Secondary | ICD-10-CM | POA: Diagnosis not present

## 2017-10-14 LAB — LIPID PANEL
Chol/HDL Ratio: 2.2 ratio (ref 0.0–5.0)
Cholesterol, Total: 189 mg/dL (ref 100–199)
HDL: 87 mg/dL (ref >39)
LDL Calculated: 88 mg/dL (ref 0–99)
Triglycerides: 72 mg/dL (ref 0–149)
VLDL Cholesterol Cal: 14 mg/dL (ref 5–40)

## 2017-10-14 MED ORDER — EVOLOCUMAB 140 MG/ML ~~LOC~~ SOAJ: 140.0000 mg | SUBCUTANEOUS | 11 refills | Status: DC

## 2017-10-14 NOTE — Telephone Encounter (Signed)
Rx for reptha 140mg  - initial

## 2017-10-21 ENCOUNTER — Telehealth: Payer: Self-pay | Admitting: Cardiovascular Disease

## 2017-10-21 NOTE — Telephone Encounter (Signed)
Information provided to [place]. [phone]

## 2017-10-21 NOTE — Telephone Encounter (Signed)
[name] [name] ( Cover My Meds) is calling because they received a error message stating that they could not find the patient ( The name was submitted incorrectly ) a new key was created and you can go an re-submit under this key which is [id number]

## 2017-11-05 ENCOUNTER — Telehealth: Payer: Self-pay | Admitting: Pharmacist

## 2017-11-05 MED ORDER — ALIROCUMAB 150 MG/ML ~~LOC~~ SOPN: 150.0000 mg | PEN_INJECTOR | SUBCUTANEOUS | 11 refills | Status: DC

## 2017-11-05 NOTE — Telephone Encounter (Signed)
Praluent prefer product for insurance. Will send new request fro Praleunt 150mg .

## 2017-11-10 DIAGNOSIS — L565 Disseminated superficial actinic porokeratosis (DSAP): Secondary | ICD-10-CM | POA: Diagnosis not present

## 2017-11-10 DIAGNOSIS — L570 Actinic keratosis: Secondary | ICD-10-CM | POA: Diagnosis not present

## 2017-11-10 DIAGNOSIS — D229 Melanocytic nevi, unspecified: Secondary | ICD-10-CM | POA: Diagnosis not present

## 2017-11-11 ENCOUNTER — Other Ambulatory Visit: Payer: Self-pay | Admitting: Cardiovascular Disease

## 2017-11-15 ENCOUNTER — Other Ambulatory Visit: Payer: Self-pay | Admitting: Pharmacist

## 2017-11-15 MED ORDER — ALIROCUMAB 150 MG/ML ~~LOC~~ SOPN: 150.0000 mg | PEN_INJECTOR | SUBCUTANEOUS | 11 refills | Status: DC

## 2017-11-18 ENCOUNTER — Telehealth: Payer: Self-pay | Admitting: Pharmacist

## 2017-11-18 NOTE — Telephone Encounter (Signed)
LMOM; patient to call back if unable to afford Praluent.  Prior-Auth approved until 05/2018. Rx sent to [facility].

## 2017-12-10 DIAGNOSIS — Z23 Encounter for immunization: Secondary | ICD-10-CM | POA: Diagnosis not present

## 2017-12-22 ENCOUNTER — Telehealth: Payer: Self-pay | Admitting: Cardiovascular Disease

## 2017-12-22 DIAGNOSIS — Z79899 Other long term (current) drug therapy: Secondary | ICD-10-CM

## 2017-12-22 DIAGNOSIS — E7849 Other hyperlipidemia: Secondary | ICD-10-CM

## 2017-12-22 DIAGNOSIS — I2510 Atherosclerotic heart disease of native coronary artery without angina pectoris: Secondary | ICD-10-CM

## 2017-12-22 DIAGNOSIS — I2583 Coronary atherosclerosis due to lipid rich plaque: Secondary | ICD-10-CM

## 2017-12-22 NOTE — Telephone Encounter (Signed)
Okay to check labs prior to office visit

## 2017-12-22 NOTE — Telephone Encounter (Signed)
Pt wants to know if he should have his lab work before his appt with Dr Claiborne Billings on 02-11-18? If so please mail his lab order.

## 2017-12-23 NOTE — Telephone Encounter (Signed)
Lab entered Mailed lab slips to pt to have done before 02-11-18 appt

## 2018-02-09 DIAGNOSIS — E7849 Other hyperlipidemia: Secondary | ICD-10-CM | POA: Diagnosis not present

## 2018-02-09 DIAGNOSIS — I2583 Coronary atherosclerosis due to lipid rich plaque: Secondary | ICD-10-CM | POA: Diagnosis not present

## 2018-02-09 DIAGNOSIS — I2510 Atherosclerotic heart disease of native coronary artery without angina pectoris: Secondary | ICD-10-CM | POA: Diagnosis not present

## 2018-02-09 DIAGNOSIS — Z79899 Other long term (current) drug therapy: Secondary | ICD-10-CM | POA: Diagnosis not present

## 2018-02-09 LAB — COMPREHENSIVE METABOLIC PANEL
ALT: 36 IU/L (ref 0–44)
AST: 62 IU/L — ABNORMAL HIGH (ref 0–40)
Albumin/Globulin Ratio: 2.3 — ABNORMAL HIGH (ref 1.2–2.2)
Albumin: 4.6 g/dL (ref 3.5–4.8)
Alkaline Phosphatase: 49 IU/L (ref 39–117)
BUN/Creatinine Ratio: 12 (ref 10–24)
BUN: 10 mg/dL (ref 8–27)
Bilirubin Total: 0.4 mg/dL (ref 0.0–1.2)
CO2: 24 mmol/L (ref 20–29)
Calcium: 9.4 mg/dL (ref 8.6–10.2)
Chloride: 102 mmol/L (ref 96–106)
Creatinine, Ser: 0.86 mg/dL (ref 0.76–1.27)
GFR calc Af Amer: 99 mL/min/{1.73_m2} (ref >59)
GFR calc non Af Amer: 86 mL/min/{1.73_m2} (ref >59)
Globulin, Total: 2 g/dL (ref 1.5–4.5)
Glucose: 88 mg/dL (ref 65–99)
Potassium: 4.1 mmol/L (ref 3.5–5.2)
Sodium: 142 mmol/L (ref 134–144)
Total Protein: 6.6 g/dL (ref 6.0–8.5)

## 2018-02-09 LAB — LIPID PANEL
Chol/HDL Ratio: 1.6 ratio (ref 0.0–5.0)
Cholesterol, Total: 139 mg/dL (ref 100–199)
HDL: 87 mg/dL (ref >39)
LDL Calculated: 39 mg/dL (ref 0–99)
Triglycerides: 66 mg/dL (ref 0–149)
VLDL Cholesterol Cal: 13 mg/dL (ref 5–40)

## 2018-02-11 ENCOUNTER — Ambulatory Visit (INDEPENDENT_AMBULATORY_CARE_PROVIDER_SITE_OTHER): Payer: 59 | Admitting: Cardiovascular Disease

## 2018-02-11 ENCOUNTER — Encounter: Payer: Self-pay | Admitting: Cardiovascular Disease

## 2018-02-11 VITALS — BP 149/91 | HR 60 | Ht 68.0 in | Wt 220.6 lb

## 2018-02-11 DIAGNOSIS — Z8739 Personal history of other diseases of the musculoskeletal system and connective tissue: Secondary | ICD-10-CM

## 2018-02-11 DIAGNOSIS — Z8249 Family history of ischemic heart disease and other diseases of the circulatory system: Secondary | ICD-10-CM

## 2018-02-11 DIAGNOSIS — I2510 Atherosclerotic heart disease of native coronary artery without angina pectoris: Secondary | ICD-10-CM | POA: Diagnosis not present

## 2018-02-11 DIAGNOSIS — E7849 Other hyperlipidemia: Secondary | ICD-10-CM

## 2018-02-11 DIAGNOSIS — I2583 Coronary atherosclerosis due to lipid rich plaque: Secondary | ICD-10-CM | POA: Diagnosis not present

## 2018-02-11 DIAGNOSIS — I10 Essential (primary) hypertension: Secondary | ICD-10-CM | POA: Diagnosis not present

## 2018-02-11 NOTE — Patient Instructions (Signed)
Medication Instructions:  Your physician recommends that you continue on your current medications as directed. Please refer to the Current Medication list given to you today.  Follow-Up: Your physician wants you to follow-up in: December with Dr. Claiborne Billings. You will receive a reminder letter in the mail two months in advance. If you don't receive a letter, please call our office to schedule the follow-up appointment.   Any Other Special Instructions Will Be Listed Below (If Applicable).     If you need a refill on your cardiac medications before your next appointment, please call your pharmacy.

## 2018-02-11 NOTE — Progress Notes (Signed)
Patient ID: [name] [name], male   DOB: 1946-08-02, 72 y.o.   MRN: [id number]     HPI:  [name] [name] is a 72 y.o. male who is a former patient of Dr. Rollene Fare.  He presents for 5 month  follow-up cardiology evaluation.  [name] [name] has a long-standing history of familial hyperlipidemia.  His father and one of his brothers had familial hyperlipidemia.   His father died at age 39 secondary to advanced coronary artery disease.  [name] [name] in the past had been as high as 400.  He was initially diagnosed in the 1970s.  He has participated in numerous drug studies over the years and was one of the precipitants in the initial statin trials with Mevacor.  As part of one of his studies, he underwent diagnostic cardiac catheterization in [place].  He was asymptomatic with chest pain but was found to have mild coronary obstructive disease with 20-40% RCA stenoses, 20-30% circumflex stenoses,  At least moderate LAD stenosis with poststenotic dilatation involving the diagonal and septal perforating artery in 1987.  He has not been on medical therapy for CAD, with the exception of aggressive lipid-lowering treatment.  His last nuclear perfusion study was in 2012, which continue to show normal perfusion.  There was attenuation artifact.  Post-rest ejection fraction was 53%. In June 2014 an echo Doppler study  showed an ejection fraction of 55-60%; Normal diastolic parameters.  He had mild left atrial dilatation.  There was mild aortic valve sclerosis without stenosis.  The patient tells me he has been aggressive with his follow-up.  He typically has laboratory checked via life extension on his own. Remotely, he also underwent VAP testing to determine particles size and numbers.  H e has been on Zetia 10 mg Crestor 40 mg, Niaspan 2000 mg and 2000 mg of omega-3 fatty acids.  I did review blood work that he had had in April 2015 from life extension and at that time on aggressive medical therapy.  His total  cholesterol was 185, triglycerides 59, HDL cholesterol 82, LDL cholesterol 91.  Homocysteine was 11.7.  Hemoglobin A1c was 5.7.  Thyroid function studies were normal.  He was not anemic.  He had normal renal function.  He participated in this study looking at possible side effects from the injection site for PCSK9 inhibitor therapy.  He tells me his brother  underwent stenting of his coronary artery age 59.  The patient also has a history of gout, and hypertension.  He has been tolerating my Micardis 80 mg without side effects.  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  He denies chest pain.  He denies PND, orthopnea.  He denies palpitations.  In February 2016 he had an NMR LipoProfile.  Total cholesterol was 185, triglycerides 72, HDL 94, LDL C 77, and LDL particle number was excellent at 683.  Insulin resistance score was 31.  In March 2016  a nuclear perfusion study continued  to show normal perfusion and was low risk with probable bowel attenuation artifact.  As part of his own blood testing he had another lipid panel in August 2016 which showed a total cholesterol 140, HDL 50, LDL 77, and very normal triglyceride levels.  He had purposeful weight loss of 30 pounds.  He tells me he had undergone hip surgery by Dr. Lonzo Cloud in [place], [place].  He underwent an NMR LipoProfile on 12/20/2015.  Total cholesterol is 153, HDL 76, triglycerides 45, and calculated LDL was 68.  He had 1241, LDL particles which were predominantly large.  There were only 196 small LDL particles.  April lipoprotein B was normal at 61 as was LPa at 14.  He again had his labs rechecked as part of life extension, blood testing in June 2017.  He has a remote history of gout and has been taking a you.  All and uric acid was 3.3.  Renal function was normal.  He had normal LFTs.  Lipid studies revealed a total cholesterol of 167, triglycerides 46, HDL 85, and LDL 73.  Homocysteine was normal at 9.5.  PSA was 0.8.   Testosterone was low at 297.  Hemoglobin A1c was excellent at 5.3.  TSH was normal.  He was not anemic with hemoglobin of 13.5, hematocrit of 42.3.  MCV was minimally increased at 102.  Vitamin D level was normal at 95.7.  He had been taking vitamin D 10,000 units daily supplementation  Since I last saw him one year ago, he has remained active.  Over the past year he remains active.  He plays golf at least 2 days per week.  days a week.  He walks several other days.  He continues to work as a Engineer, petroleum.  He is widowed and not sexually active.   He underwent follow-up laboratory on 04/14/2017 at life extension.  BUN 16, creatinine 0.96.  Total cholesterol 189, triglycerides 71, HDL 89, LDL 86.  Homocysteine 14.5.  C-reactive protein 2.1.  Hemoglobin 14.1/hematocrit 42.8.  Serum testosterone 196.  TSH 2.6.  Vitamin D 50.2.  Apo lipoprotein B 81.    When I last saw him in December 2018, I had a long discussion with reference to PCSK 9 inhibition in light of his familial hyperlipidemia.  He has since been started on Praluent 150 mg every 2-week injections and has been received 2 months of treatment.  Follow-up laboratory 2 days ago has shown a total cholesterol of 139, triglycerides 66, HDL 87, and LDL 39.  He has not had any side  effects to the medication.  His insurance company had denied Repatha.  He continues to be on concomitant therapy with Zetia 10 mg and rosuvastatin 40 mg.  He also is on Toprol-XL 25 mg and telmisartan 80 mg for hypertension.  He continues to work in a Scientist, physiological.  He recently returned from a two-week trip to [place] to visit his son who lives there.  He presents for reevaluation.  Past Medical History:  Diagnosis Date  . Coronary artery disease   . Gout    Of the left third, fourth, and fifth MTPareas with swelling of left foot and pain  . Hyperlipidemia   . Hypertension     Past Surgical History:  Procedure Laterality Date  . CARDIAC  CATHETERIZATION  01/04/1986    No Known Allergies  Current Outpatient Medications  Medication Sig Dispense Refill  . AFLURIA QUADRIVALENT 0.5 ML injection Inject as directed once.  0  . Alirocumab (PRALUENT) 150 MG/ML SOPN Inject 150 mg into the skin every 14 (fourteen) days. 2 pen 11  . allopurinol (ZYLOPRIM) 300 MG tablet Take 1 tablet by mouth daily.    Marland Kitchen aspirin 325 MG tablet Take 325 mg by mouth daily.    . Cholecalciferol (VITAMIN D3) 10000 UNITS capsule Take 10,000 Units by mouth daily.    . Coenzyme Q10 (COQ10) 100 MG CAPS Take 1 capsule by mouth daily.    Marland Kitchen ezetimibe (ZETIA) 10 MG tablet Take 1  tablet (10 mg total) by mouth daily. 90 tablet 3  . Glucosamine-Chondroitin (GLUCOSAMINE CHONDR COMPLEX PO) Take 1 capsule by mouth daily.    . Influenza Vac Typ A&B Surf Ant 0.5 ML SUSY Inject as directed once.    . Influenza Virus Vacc Split PF (AFLURIA PRESERVATIVE FREE) 0.5 ML SUSY Inject as directed once.    . metoprolol succinate (TOPROL-XL) 25 MG 24 hr tablet TAKE 1 TABLET BY MOUTH EVERY DAY 30 tablet 4  . nabumetone (RELAFEN) 500 MG tablet Take 500 mg by mouth 2 (two) times daily.    . nitroGLYCERIN (NITROSTAT) 0.4 MG SL tablet Place 1 tablet (0.4 mg total) under the tongue every 5 (five) minutes as needed for chest pain. 25 tablet 3  . Omega-3 Fatty Acids (OMEGA 3 PO) Take 2 capsules by mouth daily.    . rosuvastatin (CRESTOR) 40 MG tablet Take 1 tablet (40 mg total) by mouth daily. 90 tablet 3  . SHINGRIX injection Inject as directed once.  0  . telmisartan (MICARDIS) 80 MG tablet Take 1 tablet by mouth daily.     No current facility-administered medications for this visit.     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  He is married and has 2 children, ages 94 and 28.  He has 2 grandchildren.  There is no history of tobacco use.  He does not drink alcohol.  Family history is notable that his mother died at age 83 with old age.   Father died at age 47 with advanced atherosclerosis.  He has one brother who is status post coronary stenting.  ROS General: Negative; No fevers, chills, or night sweats HEENT: Negative; No changes in vision or hearing, sinus congestion, difficulty swallowing Pulmonary: Negative; No cough, wheezing, shortness of breath, hemoptysis Cardiovascular:  See HPI; No chest pain, presyncope, syncope, palpitations, edema GI: Negative; No nausea, vomiting, diarrhea, or abdominal pain GU: Negative; No dysuria, hematuria, or difficulty voiding Musculoskeletal: Negative; no myalgias, joint pain, or weakness Hematologic/Oncologic: Negative; no easy bruising, bleeding Neurologic: Positive for gout Endocrine: Negative; no heat/cold intolerance; no diabetes Neuro: Negative; no changes in balance, headaches Skin: Osteophytic for chronic rash  Psychiatric: Negative; No behavioral problems, depression Sleep: Negative; No daytime sleepiness, hypersomnolence, bruxism, restless legs, hypnogagnic hallucinations Other comprehensive 14 point system review is negative   Physical Exam BP (!) 149/91   Pulse 60   Ht 5' 8"  (1.727 m)   Wt 220 lb 9.6 oz (100.1 kg)   BMI 33.54 kg/m    Repeat blood pressure by me was 124/82  Wt Readings from Last 3 Encounters:  02/11/18 220 lb 9.6 oz (100.1 kg)  10/12/17 216 lb 12.8 oz (98.3 kg)  09/02/16 198 lb 6.4 oz (90 kg)   General: Alert, oriented, no distress.  Skin: normal turgor, no rashes, warm and dry HEENT: Normocephalic, atraumatic. Pupils equal round and reactive to light; sclera anicteric; extraocular muscles intact;  Nose without nasal septal hypertrophy Mouth/Parynx benign; Mallinpatti scale 3 Neck: No JVD, no carotid bruits; normal carotid upstroke Lungs: clear to ausculatation and percussion; no wheezing or rales Chest wall: without tenderness to palpitation Heart: PMI not displaced, RRR, s1 s2 normal, 1/6 systolic murmur, no diastolic murmur, no rubs,  gallops, thrills, or heaves Abdomen: soft, nontender; no hepatosplenomehaly, BS+; abdominal aorta nontender and not dilated by palpation. Back: no CVA tenderness Pulses 2+ Musculoskeletal: full range of motion, normal strength, no joint deformities Extremities: no clubbing cyanosis or  edema, Homan's sign negative  Neurologic: grossly nonfocal; Cranial nerves grossly wnl Psychologic: Normal mood and affect   ECG (independently read by me): Sinus rhythm at 60 bpm.  First-degree AV block.  No ectopy.  December 2018 ECG (independently read by me): Normal sinus rhythm at 72 bpm.  Early transition.  First-degree AV block with a PR interval of 206 ms.  October 2016 ECG (independently read by me): Normal sinus rhythm at 62.  No ectopy.  No ST segment changes.  ECG (independently read by me): Normal sinus rhythm at 71 bpm.  No ectopy.  QTc interval 441 ms  LABS: BMP Latest Ref Rng & Units 02/09/2018 12/20/2015 12/06/2014  Glucose 65 - 99 mg/dL 88 93 80  BUN 8 - 27 mg/dL 10 11 17   Creatinine 0.76 - 1.27 mg/dL 0.86 0.84 0.98  BUN/Creat Ratio 10 - 24 12 - 17  Sodium 134 - 144 mmol/L 142 140 138  Potassium 3.5 - 5.2 mmol/L 4.1 4.7 4.7  Chloride 96 - 106 mmol/L 102 101 97  CO2 20 - 29 mmol/L 24 31 24   Calcium 8.6 - 10.2 mg/dL 9.4 9.2 9.6   Hepatic Function Latest Ref Rng & Units 02/09/2018 12/20/2015 12/06/2014  Total Protein 6.0 - 8.5 g/dL 6.6 5.7(L) 7.0  Albumin 3.5 - 4.8 g/dL 4.6 3.9 4.8  AST 0 - 40 IU/L 62(H) 40(H) 28  ALT 0 - 44 IU/L 36 34 20  Alk Phosphatase 39 - 117 IU/L 49 38(L) 53  Total Bilirubin 0.0 - 1.2 mg/dL 0.4 0.6 0.4   CBC Latest Ref Rng & Units 12/06/2014  WBC 3.4 - 10.8 x10E3/uL 6.8  Hemoglobin 12.6 - 17.7 g/dL 13.9  Hematocrit 37.5 - 51.0 % 40.8  Platelets 150 - 379 x10E3/uL 150   Lab Results  Component Value Date   MCV 97 12/06/2014   Lab Results  Component Value Date   TSH 2.840 12/06/2014   Lipid Panel     Component Value Date/Time   CHOL 139 02/09/2018 0852    CHOL 153 12/20/2015 0949   TRIG 66 02/09/2018 0852   TRIG 45 12/20/2015 0949   TRIG 72 12/06/2014 0944   HDL 87 02/09/2018 0852   HDL 76 12/20/2015 0949   HDL 94 12/06/2014 0944   CHOLHDL 1.6 02/09/2018 0852   CHOLHDL 2.0 12/20/2015 0949   LDLCALC 39 02/09/2018 0852   LDLCALC 68 12/20/2015 0949   RADIOLOGY: No results found.  IMPRESSION:  1. Familial hyperlipidemia   2. Coronary artery disease due to lipid rich plaque   3. Essential hypertension   4. Family history of heart disease in male family member before age 58   5. History of gout     ASSESSMENT AND PLAN: [name] [name] who has a history of probable heterozygous familial hyperlipidemia and was initially noted to have [name] in excess of 400 in the 1970s.  His father and brother also has similar history.  He states his 2 children do not have this disorder.  He has been on aggressive lipid-lowering therapy ever since statins were initially introduced and had participated in the early clinical trials of lovastatin and simvastatin.  He has been demonstrated to have coronary atherosclerosis by cardiac catheterization in the 1980s.  In 2016 prior to undergoing hip surgery he underwent a four-year follow-up nuclear perfusion study which continued to be low risk and did not reveal any significant perfusion abnormalities.  Since I last saw him, he has initiated therapy  with Praluent 159 injection every 2 weeks and continues to take rosuvastatin 40 mg and Zetia 10 mgmg in addition to omega-3 fatty acids.  Recent laboratory shows an excellent response with an LDL cholesterol now at 39.  Total cholesterol is 139 and triglycerides 66 with HDL at 87.  He is now off niacin.  His blood pressure today is stable on telmisartan 80 mg in addition to Toprol-XL 25 mg daily.  His previous episodes of chest fluttering have resolved with beta-blocker therapy.  He has a history of gout but has not had any recent  symptomatology.  I have suggested he reduce his aspirin to 81 mg daily rather than 325.  He continues to be active.  He is mildly obese with a BMI of 33.5 and weight loss and increased exercise was recommended.  I will see him in December 2019 for follow-up evaluation.   Time spent: 30 minutes   Troy Sine, MD, [facility] 02/13/2018 10:08 AM

## 2018-02-13 ENCOUNTER — Encounter: Payer: Self-pay | Admitting: Cardiovascular Disease

## 2018-03-04 ENCOUNTER — Other Ambulatory Visit: Payer: Self-pay | Admitting: Cardiovascular Disease

## 2018-03-05 ENCOUNTER — Other Ambulatory Visit: Payer: Self-pay | Admitting: Cardiovascular Disease

## 2018-03-07 NOTE — Telephone Encounter (Signed)
REFILL 

## 2018-03-08 ENCOUNTER — Other Ambulatory Visit: Payer: Self-pay | Admitting: Cardiovascular Disease

## 2018-03-09 NOTE — Telephone Encounter (Signed)
Rx(s) sent to pharmacy electronically.  

## 2018-04-26 ENCOUNTER — Other Ambulatory Visit: Payer: Self-pay | Admitting: Pharmacist

## 2018-04-26 MED ORDER — ALIROCUMAB 150 MG/ML ~~LOC~~ SOPN: 150.0000 mg | PEN_INJECTOR | SUBCUTANEOUS | 11 refills | Status: DC

## 2018-05-26 DIAGNOSIS — Z8739 Personal history of other diseases of the musculoskeletal system and connective tissue: Secondary | ICD-10-CM | POA: Diagnosis not present

## 2018-05-26 DIAGNOSIS — I10 Essential (primary) hypertension: Secondary | ICD-10-CM | POA: Diagnosis not present

## 2018-05-26 DIAGNOSIS — I2510 Atherosclerotic heart disease of native coronary artery without angina pectoris: Secondary | ICD-10-CM | POA: Diagnosis not present

## 2018-05-26 DIAGNOSIS — Z125 Encounter for screening for malignant neoplasm of prostate: Secondary | ICD-10-CM | POA: Diagnosis not present

## 2018-05-26 DIAGNOSIS — Z0000 Encounter for general adult medical examination without abnormal findings: Secondary | ICD-10-CM | POA: Diagnosis not present

## 2018-06-03 DIAGNOSIS — M4316 Spondylolisthesis, lumbar region: Secondary | ICD-10-CM | POA: Diagnosis not present

## 2018-06-03 DIAGNOSIS — M5030 Other cervical disc degeneration, unspecified cervical region: Secondary | ICD-10-CM | POA: Diagnosis not present

## 2018-06-03 DIAGNOSIS — M5136 Other intervertebral disc degeneration, lumbar region: Secondary | ICD-10-CM | POA: Diagnosis not present

## 2018-07-13 ENCOUNTER — Other Ambulatory Visit: Payer: Self-pay | Admitting: Cardiovascular Disease

## 2018-07-13 NOTE — Telephone Encounter (Signed)
Rx request sent to pharmacy.  

## 2018-07-24 ENCOUNTER — Other Ambulatory Visit: Payer: Self-pay | Admitting: Cardiovascular Disease

## 2018-08-27 DIAGNOSIS — Z23 Encounter for immunization: Secondary | ICD-10-CM | POA: Diagnosis not present

## 2018-08-31 ENCOUNTER — Other Ambulatory Visit: Payer: Self-pay | Admitting: Cardiovascular Disease

## 2018-11-14 ENCOUNTER — Other Ambulatory Visit: Payer: Self-pay

## 2018-11-14 NOTE — Telephone Encounter (Signed)
Opened in error

## 2018-11-16 ENCOUNTER — Telehealth: Payer: Self-pay

## 2018-11-16 NOTE — Telephone Encounter (Signed)
Called pt to see if they were having issues getting praluent filled because we got a notication in the office that the pt is having difficulties getting it filled but that they have a refill in transit to them currently

## 2018-12-08 ENCOUNTER — Ambulatory Visit: Payer: 59 | Admitting: Cardiovascular Disease

## 2018-12-08 ENCOUNTER — Encounter: Payer: Self-pay | Admitting: Cardiovascular Disease

## 2018-12-08 VITALS — BP 138/88 | HR 81 | Ht 68.0 in | Wt 228.0 lb

## 2018-12-08 DIAGNOSIS — I10 Essential (primary) hypertension: Secondary | ICD-10-CM

## 2018-12-08 DIAGNOSIS — E7849 Other hyperlipidemia: Secondary | ICD-10-CM

## 2018-12-08 DIAGNOSIS — Z6834 Body mass index (BMI) 34.0-34.9, adult: Secondary | ICD-10-CM

## 2018-12-08 DIAGNOSIS — I25119 Atherosclerotic heart disease of native coronary artery with unspecified angina pectoris: Secondary | ICD-10-CM

## 2018-12-08 DIAGNOSIS — E66811 Obesity, class 1: Secondary | ICD-10-CM

## 2018-12-08 DIAGNOSIS — Z8739 Personal history of other diseases of the musculoskeletal system and connective tissue: Secondary | ICD-10-CM

## 2018-12-08 DIAGNOSIS — Z8249 Family history of ischemic heart disease and other diseases of the circulatory system: Secondary | ICD-10-CM

## 2018-12-08 DIAGNOSIS — E6609 Other obesity due to excess calories: Secondary | ICD-10-CM

## 2018-12-08 MED ORDER — HYDROCHLOROTHIAZIDE 12.5 MG PO CAPS: 12.5000 mg | ORAL_CAPSULE | Freq: Every day | ORAL | 3 refills | Status: DC

## 2018-12-08 NOTE — Progress Notes (Signed)
Patient ID: [name] [name], male   DOB: Apr 09, 1946, 73 y.o.   MRN: [id number]     HPI:  [name] [name] is a 73 y.o. male who is a former patient of Dr. Rollene Fare.  He presents for a 9 month  follow-up cardiology evaluation.  [name] [name] has a long-standing history of familial hyperlipidemia.  His father and one of his brothers had familial hyperlipidemia.   His father died at age 14 secondary to advanced coronary artery disease.  [name] [name] cholesterols in the past had been as high as 400.  He was initially diagnosed in the 1970s.  He has participated in numerous drug studies over the years and was one of the precipitants in the initial statin trials with Mevacor.  As part of one of his studies, he underwent diagnostic cardiac catheterization in [place].  He was asymptomatic with chest pain but was found to have mild coronary obstructive disease with 20-40% RCA stenoses, 20-30% circumflex stenoses,  At least moderate LAD stenosis with poststenotic dilatation involving the diagonal and septal perforating artery in 1987.  He has not been on medical therapy for CAD, with the exception of aggressive lipid-lowering treatment.  His last nuclear perfusion study was in 2012, which continue to show normal perfusion.  There was attenuation artifact.  Post-rest ejection fraction was 53%. In June 2014 an echo Doppler study  showed an ejection fraction of 55-60%; Normal diastolic parameters.  He had mild left atrial dilatation.  There was mild aortic valve sclerosis without stenosis.  The patient tells me he has been aggressive with his follow-up.  He typically has laboratory checked via life extension on his own. Remotely, he also underwent VAP testing to determine particles size and numbers.  H e has been on Zetia 10 mg Crestor 40 mg, Niaspan 2000 mg and 2000 mg of omega-3 fatty acids.  I did review blood work that he had had in April 2015 from life extension and at that time on aggressive medical therapy.  His total  cholesterol was 185, triglycerides 59, HDL cholesterol 82, LDL cholesterol 91.  Homocysteine was 11.7.  Hemoglobin A1c was 5.7.  Thyroid function studies were normal.  He was not anemic.  He had normal renal function.  He participated in this study looking at possible side effects from the injection site for PCSK9 inhibitor therapy.  He tells me his brother  underwent stenting of his coronary artery age 51.  The patient also has a history of gout, and hypertension.  He has been tolerating my [name] 80 mg without side effects.  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  He denies chest pain.  He denies PND, orthopnea.  He denies palpitations.  In February 2016 he had an NMR LipoProfile.  Total cholesterol was 185, triglycerides 72, HDL 94, LDL C 77, and LDL particle number was excellent at 683.  Insulin resistance score was 31.  In March 2016  a nuclear perfusion study continued  to show normal perfusion and was low risk with probable bowel attenuation artifact.  As part of his own blood testing he had another lipid panel in August 2016 which showed a total cholesterol 140, HDL 50, LDL 77, and very normal triglyceride levels.  He had purposeful weight loss of 30 pounds.  He tells me he had undergone hip surgery by Dr. Lonzo [name] in [place], [place].  He underwent an NMR LipoProfile on 12/20/2015.  Total cholesterol is 153, HDL 76, triglycerides 45, and calculated LDL was 68.  He had 1241, LDL particles which were predominantly large.  There were only 196 small LDL particles.  April lipoprotein B was normal at 61 as was LPa at 14.  He again had his labs rechecked as part of life extension, blood testing in June 2017.  He has a remote history of gout and has been taking a you.  All and uric acid was 3.3.  Renal function was normal.  He had normal LFTs.  Lipid studies revealed a total cholesterol of 167, triglycerides 46, HDL 85, and LDL 73.  Homocysteine was normal at 9.5.  PSA was 0.8.   Testosterone was low at 297.  Hemoglobin A1c was excellent at 5.3.  TSH was normal.  He was not anemic with hemoglobin of 13.5, hematocrit of 42.3.  MCV was minimally increased at 102.  Vitamin D level was normal at 95.7.  He had been taking vitamin D 10,000 units daily supplementation  Since I last saw him one year ago, he has remained active.  Over the past year he remains active.  He plays golf at least 2 days per week.  days a week.  He walks several other days.  He continues to work as a Engineer, petroleum.  He is widowed and not sexually active.   He underwent follow-up laboratory on 04/14/2017 at life extension.  BUN 16, creatinine 0.96.  Total cholesterol 189, triglycerides 71, HDL 89, LDL 86.  Homocysteine 14.5.  C-reactive protein 2.1.  Hemoglobin 14.1/hematocrit 42.8.  Serum testosterone 196.  TSH 2.6.  Vitamin D 50.2.  Apo lipoprotein B 81.    When I  saw him in December 2018, I had a long discussion with reference to PCSK 9 inhibition in light of his familial hyperlipidemia.  He has since been started on Praluent 150 mg every 2-week injections and had received 2 months of treatment.  Follow-up laboratory 2 days ago has shown a total cholesterol of 139, triglycerides 66, HDL 87, and LDL 39.  He has not had any side  effects to the medication.  His insurance company had denied Repatha.  He continues to be on concomitant therapy with Zetia 10 mg and rosuvastatin 40 mg.  He also is on Toprol-XL 25 mg and telmisartan 80 mg for hypertension.  He continues to work in a Scientist, physiological.  He recently returned from a two-week trip to [place] to visit his son who lives there.    I last saw him in April 2019.  He continued to be asymptomatic with reference to chest pain, and did not have any change in exercise tolerance.  Lipid studies at that time revealed a total cholesterol 139, triglycerides 66, HDL 87 and LDL cholesterol was 39.  On his own, he underwent laboratory by life extension  in August 2019.  Chemistry was normal.  Fasting glucose was 83.  Total cholesterol 114, triglycerides 42, HDL 81, and LDL was 25.  Homocystine was normal.  Vitamin D level was 63.2.  Insulin level was normal at 5.7.  April lipoprotein B was excellent at 32.  Life line screening screening was done by his own accord which he typically gets done every several years.  This now has shown improvement from previous evaluations with carotids now being totally normal without previous plaque.  His abdominal aorta was normal, there was no PVD.  Remains active.  He plays golf 2 times per week at a minimum.  He admits to weight gain over the holidays.  He presents  for follow-up evaluation   Past Medical History:  Diagnosis Date  . Coronary artery disease   . Gout    Of the left third, fourth, and fifth MTPareas with swelling of left foot and pain  . Hyperlipidemia   . Hypertension     Past Surgical History:  Procedure Laterality Date  . CARDIAC CATHETERIZATION  01/04/1986    No Known Allergies  Current Outpatient Medications  Medication Sig Dispense Refill  . AFLURIA QUADRIVALENT 0.5 ML injection Inject as directed once.  0  . Alirocumab (PRALUENT) 150 MG/ML SOPN Inject 150 mg into the skin every 14 (fourteen) days. 2 pen 11  . allopurinol (ZYLOPRIM) 300 MG tablet Take 1 tablet by mouth daily.    Marland Kitchen aspirin 325 MG tablet Take 325 mg by mouth daily.    . Cholecalciferol (VITAMIN D3) 10000 UNITS capsule Take 10,000 Units by mouth daily.    . Coenzyme Q10 (COQ10) 100 MG CAPS Take 1 capsule by mouth daily.    Marland Kitchen ezetimibe (ZETIA) 10 MG tablet TAKE 1 TABLET BY MOUTH  DAILY 90 tablet 3  . Glucosamine-Chondroitin (GLUCOSAMINE CHONDR COMPLEX PO) Take 1 capsule by mouth daily.    . Influenza Vac Typ A&B Surf Ant 0.5 ML SUSY Inject as directed once.    . Influenza Virus Vacc Split PF (AFLURIA PRESERVATIVE FREE) 0.5 ML SUSY Inject as directed once.    . metoprolol succinate (TOPROL-XL) 25 MG 24 hr tablet TAKE 1  TABLET BY MOUTH EVERY DAY 90 tablet 2  . nabumetone (RELAFEN) 500 MG tablet Take 500 mg by mouth 2 (two) times daily.    Marland Kitchen NIASPAN 1000 MG CR tablet TAKE 1 TABLET BY MOUTH  EVERY NIGHT AT BEDTIME 90 tablet 1  . nitroGLYCERIN (NITROSTAT) 0.4 MG SL tablet TAKE 1 TABLET BY MOUTH UNDER THE TONGUE EVERY 5 MINUTES AS NEEDED FOR CHEST PAIN 25 tablet 0  . Omega-3 Fatty Acids (OMEGA 3 PO) Take 2 capsules by mouth daily.    . rosuvastatin (CRESTOR) 40 MG tablet TAKE 1 TABLET BY MOUTH  DAILY 90 tablet 3  . SHINGRIX injection Inject as directed once.  0  . telmisartan ([name]) 80 MG tablet Take 1 tablet by mouth daily.    . hydrochlorothiazide (MICROZIDE) 12.5 MG capsule Take 1 capsule (12.5 mg total) by mouth daily. 90 capsule 3   No current facility-administered medications for this visit.     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  He is married and has 2 children, ages 56 and 46.  He has 2 grandchildren.  There is no history of tobacco use.  He does not drink alcohol.  Family history is notable that his mother died at age 58 with old age.  Father died at age 37 with advanced atherosclerosis.  He has one brother who is status post coronary stenting.  ROS General: Negative; No fevers, chills, or night sweats HEENT: Negative; No changes in vision or hearing, sinus congestion, difficulty swallowing Pulmonary: Negative; No cough, wheezing, shortness of breath, hemoptysis Cardiovascular:  See HPI; No chest pain, presyncope, syncope, palpitations,  Hives he may note some trace ankle swelling GI: Negative; No nausea, vomiting, diarrhea, or abdominal pain GU: Negative; No dysuria, hematuria, or difficulty voiding Musculoskeletal: Negative; no myalgias, joint pain, or weakness Hematologic/Oncologic: Negative; no easy bruising, bleeding Neurologic: Positive for gout Endocrine: Negative; no heat/cold intolerance; no diabetes Neuro: Negative; no changes in  balance, headaches Skin: Osteophytic for chronic rash  Psychiatric: Negative; No behavioral problems, depression Sleep: Negative; No daytime sleepiness, hypersomnolence, bruxism, restless legs, hypnogagnic hallucinations Other comprehensive 14 point system review is negative   Physical Exam BP 138/88 (BP Location: Right Arm, Patient Position: Sitting, Cuff Size: Normal)   Pulse 81   Ht _0  (1.727 m)   Wt 228 lb (103.4 kg)   BMI 34.67 kg/m    Repeat blood pressure by me was elevated at 146/88.  Wt Readings from Last 3 Encounters:  12/08/18 228 lb (103.4 kg)  02/11/18 220 lb 9.6 oz (100.1 kg)  10/12/17 216 lb 12.8 oz (98.3 kg)   General: Alert, oriented, no distress.  Skin: normal turgor, no rashes, warm and dry HEENT: Normocephalic, atraumatic. Pupils equal round and reactive to light; sclera anicteric; extraocular muscles intact;  Nose without nasal septal hypertrophy Mouth/Parynx benign; Mallinpatti scale 3  Neck: No JVD, no carotid bruits; normal carotid upstroke Lungs: clear to ausculatation and percussion; no wheezing or rales Chest wall: without tenderness to palpitation Heart: PMI not displaced, RRR, s1 s2 normal, 1/6 systolic murmur, no diastolic murmur, no rubs, gallops, thrills, or heaves Abdomen: soft, nontender; no hepatosplenomehaly, BS+; abdominal aorta nontender and not dilated by palpation. Back: no CVA tenderness Pulses 2+ Musculoskeletal: full range of motion, normal strength, no joint deformities Extremities: Trivial ankle swelling no clubbing cyanosis or edema, Homan's sign negative  Neurologic: grossly nonfocal; Cranial nerves grossly wnl Psychologic: Normal mood and affect  ECG (independently read by me): Sinus rhythm at 81 bpm.  Mild first-degree AV block with a PR of 206 ms.  Nonspecific T changes.  02/11/2018 ECG (independently read by me): Sinus rhythm at 60 bpm.  First-degree AV block.  No ectopy.  December 2018 ECG (independently read by me):  Normal sinus rhythm at 72 bpm.  Early transition.  First-degree AV block with a PR interval of 206 ms.  October 2016 ECG (independently read by me): Normal sinus rhythm at 62.  No ectopy.  No ST segment changes.  ECG (independently read by me): Normal sinus rhythm at 71 bpm.  No ectopy.  QTc interval 441 ms  LABS: BMP Latest Ref Rng & Units 02/09/2018 12/20/2015 12/06/2014  Glucose 65 - 99 mg/dL 88 93 80  BUN 8 - 27 mg/dL _1 Creatinine 0.76 - 1.27 mg/dL 0.86 0.84 0.98  BUN/Creat Ratio 10 - 24 12 - 17  Sodium 134 - 144 mmol/L 142 140 138  Potassium 3.5 - 5.2 mmol/L 4.1 4.7 4.7  Chloride 96 - 106 mmol/L 102 101 97  CO2 20 - 29 mmol/L _2 Calcium 8.6 - 10.2 mg/dL 9.4 9.2 9.6   Hepatic Function Latest Ref Rng & Units 02/09/2018 12/20/2015 12/06/2014  Total Protein 6.0 - 8.5 g/dL 6.6 5.7(L) 7.0  Albumin 3.5 - 4.8 g/dL 4.6 3.9 4.8  AST 0 - 40 IU/L 62(H) 40(H) 28  ALT 0 - 44 IU/L 36 34 20  Alk Phosphatase 39 - 117 IU/L 49 38(L) 53  Total Bilirubin 0.0 - 1.2 mg/dL 0.4 0.6 0.4   CBC Latest Ref Rng & Units 12/06/2014  WBC 3.4 - 10.8 x10E3/uL 6.8  Hemoglobin 12.6 - 17.7 g/dL 13.9  Hematocrit 37.5 - 51.0 % 40.8  Platelets 150 - 379 x10E3/uL 150   Lab Results  Component Value Date   MCV 97 12/06/2014   Lab Results  Component Value Date   TSH 2.840 12/06/2014   Lipid Panel     Component Value Date/Time  CHOL 139 02/09/2018 0852   CHOL 153 12/20/2015 0949   TRIG 66 02/09/2018 0852   TRIG 45 12/20/2015 0949   TRIG 72 12/06/2014 0944   HDL 87 02/09/2018 0852   HDL 76 12/20/2015 0949   HDL 94 12/06/2014 0944   CHOLHDL 1.6 02/09/2018 0852   CHOLHDL 2.0 12/20/2015 0949   LDLCALC 39 02/09/2018 0852   LDLCALC 68 12/20/2015 0949   RADIOLOGY: No results found.  IMPRESSION:  1. Essential hypertension   2. Familial hyperlipidemia   3. Atherosclerosis of native coronary artery of native heart with angina pectoris ([place])   4. Family history of heart disease in male family  member before age 31   5. History of gout     ASSESSMENT AND PLAN: [name] [name] is a 73 year old gentleman who has a history of probable heterozygous familial hyperlipidemia and was initially noted to have cholesterols in excess of 400 in the 1970s.  His father and brother also has similar history.  He states his 2 children do not have this disorder.  He has been on aggressive lipid-lowering therapy ever since statins were initially introduced and had participated in the early clinical trials of lovastatin and simvastatin.  He had previously been demonstrated to have coronary atherosclerosis by cardiac catheterization in the 1980s.  In 2016 prior to undergoing hip surgery he underwent a four-year follow-up nuclear perfusion study which continued to be low risk and did not reveal any significant perfusion abnormalities.  Presently, his familial hyperlipidemia is being treated with Praluent 150 mg every 14 days, rosuvastatin 40 mg, Zetia 10 mg in addition to omega-3 fatty acid.  When I had last seen him I discontinued niacin.  Lipid studies are now outstanding and most recent evaluation showed a total cholesterol 114 with an LDL cholesterol of 25.  Triglycerides were 42 with HDL 81.  Apo lipoprotein B was 32.  His blood pressure today is mildly elevated and on repeat by me was 146/88.  He admits to occasional ankle swelling.  He has been on telmisartan 80 mg daily for hypertension in addition to Toprol-XL 25 mg daily.  I have suggested the addition of HCTZ 12.5 mg daily.  This should be helpful both for more optimal blood pressure control with target BP ideally less than 120/80 with stage I hypertension now beginning at 130/80.  He has a history of gout for which he takes allopurinol and denies any recent episodes.  We discussed aerobic exercise as well as strength training.  Ideally we discussed exercising for at least 150 minutes/week with strength twinning at least twice per week.  He is mildly obese  with a BMI today at 34.7 and he has experienced an 8 pound weight gain since his last evaluation.  We discussed weight loss in addition to increased exercise.  He is tolerating PCSK9 inhibition well without side effects.  Thoroughly reviewed his [employer] screening evaluation which actually has shown improvement from prior assessment, undoubtedly contributed by his aggressive LDL lowering now in the 20s.  He has been taking aspirin 325 mg which should be reduced to 81 mg.  He will be seen by Dr. Hulan Fess who is his primary physician.  I will see him in 1 year for reevaluation.  Time spent: 25 minutes  Troy Sine, MD, [facility] 12/10/2018 11:25 AM

## 2018-12-08 NOTE — Patient Instructions (Addendum)
Medication Instructions:  Start HCTZ 12.5 mg daily.  If you need a refill on your cardiac medications before your next appointment, please call your pharmacy.    Follow-Up: At [facility], you and your health needs are our priority.  As part of our continuing mission to provide you with exceptional heart care, we have created designated Provider Care Teams.  These Care Teams include your primary Cardiologist (physician) and Advanced Practice Providers (APPs -  Physician Assistants and Nurse Practitioners) who all work together to provide you with the care you need, when you need it. You will need a follow up appointment in 12 months.  Please call our office 2 months in advance to schedule this appointment.  You may see Dr.Kelly or one of the following Advanced Practice Providers on your designated Care Team: Almyra Deforest, [place] . Fabian Sharp, PA-C

## 2018-12-10 ENCOUNTER — Encounter: Payer: Self-pay | Admitting: Cardiovascular Disease

## 2019-04-10 ENCOUNTER — Other Ambulatory Visit: Payer: Self-pay | Admitting: Pharmacist Clinician (PhC)/ Clinical Pharmacy Specialist

## 2019-04-10 ENCOUNTER — Telehealth: Payer: Self-pay | Admitting: Pharmacist Clinician (PhC)/ Clinical Pharmacy Specialist

## 2019-04-10 DIAGNOSIS — E7849 Other hyperlipidemia: Secondary | ICD-10-CM

## 2019-04-10 NOTE — Telephone Encounter (Signed)
PA renewal needed.  Pt to go to lab this week for repeat lipid and hepatic panels

## 2019-04-14 LAB — HEPATIC FUNCTION PANEL
ALT: 20 IU/L (ref 0–44)
AST: 29 IU/L (ref 0–40)
Albumin: 4.4 g/dL (ref 3.7–4.7)
Alkaline Phosphatase: 40 IU/L (ref 39–117)
Bilirubin Total: 0.4 mg/dL (ref 0.0–1.2)
Bilirubin, Direct: 0.13 mg/dL (ref 0.00–0.40)
Total Protein: 6.1 g/dL (ref 6.0–8.5)

## 2019-04-14 LAB — LIPID PANEL
Chol/HDL Ratio: 1.7 ratio (ref 0.0–5.0)
Cholesterol, Total: 127 mg/dL (ref 100–199)
HDL: 75 mg/dL (ref >39)
LDL Calculated: 35 mg/dL (ref 0–99)
Triglycerides: 86 mg/dL (ref 0–149)
VLDL Cholesterol Cal: 17 mg/dL (ref 5–40)

## 2019-04-25 ENCOUNTER — Telehealth: Payer: Self-pay

## 2019-04-25 NOTE — Telephone Encounter (Signed)
Received fax information from [employer] that Praluent 150 mg/27ml was approved through 04/17/2020.  Will scan into chart for future reference.

## 2019-05-01 ENCOUNTER — Other Ambulatory Visit: Payer: Self-pay | Admitting: Cardiovascular Disease

## 2019-06-24 ENCOUNTER — Other Ambulatory Visit: Payer: Self-pay | Admitting: Cardiovascular Disease

## 2019-07-07 ENCOUNTER — Other Ambulatory Visit: Payer: Self-pay | Admitting: Cardiovascular Disease

## 2019-07-09 NOTE — Progress Notes (Signed)
Cardiology Office Note:   Date:  07/10/2019  NAME:  [name] [name]    MRN: [id number] DOB:  1945/11/10   PCP:  Troy Sine, MD  Cardiologist:  No primary care provider on file.  Electrophysiologist:  None   Referring MD: Hulan Fess, MD   Chief Complaint  Patient presents with  . Shortness of Breath   History of Present Illness:   [name] [name] is a 73 y.o. male with a hx of familial hypercholesterolemia, nonobstructive CAD, hypertension who is being seen today for the evaluation of shortness of breath at the request of Hulan Fess, MD. he presents for evaluation of 3 to 4 months of worsening shortness of breath with exertion.  Associated symptoms include chest tightness.  His symptoms seem to strictly occur with heavy exertion.  He reports this is not normal for him.  He also reports a similar pain when swinging a golf club.  He reports he was at the beach this past weekend, and had much more expected shortness of breath than usual for him.  He reports he had to climb a hill and had to take nearly 3 rags.  He denies any nausea, vomiting with the pain.  Review of his medical records demonstrates a normal Lexiscan nuclear medicine perfusion study in 2016.  He had an echocardiogram in 2014 that was fairly normal.  He has had several cardiac catheterizations in the past, which demonstrated nonobstructive CAD.  There is also question of LAD aneurysm.  He reports no recent period of inactivity due to the coronavirus pandemic.  He has stayed fairly active to the whole ordeal.  His symptoms appear to occur daily, especially if he exerts himself.  Symptoms are improved with rest.  Past Medical History: Past Medical History:  Diagnosis Date  . Coronary artery disease   . Gout    Of the left third, fourth, and fifth MTPareas with swelling of left foot and pain  . Hyperlipidemia   . Hypertension     Past Surgical History: Past Surgical History:  Procedure Laterality Date  . CARDIAC  CATHETERIZATION  01/04/1986    Current Medications: Current Meds  Medication Sig  . AFLURIA QUADRIVALENT 0.5 ML injection Inject as directed once.  . Alirocumab (PRALUENT) 150 MG/ML SOPN Inject 150 mg into the skin every 14 (fourteen) days.  Marland Kitchen allopurinol (ZYLOPRIM) 300 MG tablet Take 1 tablet by mouth daily.  Marland Kitchen aspirin 325 MG tablet Take 81 mg by mouth daily.   . Cholecalciferol (VITAMIN D3) 10000 UNITS capsule Take 10,000 Units by mouth daily.  . Coenzyme Q10 (COQ10) 100 MG CAPS Take 1 capsule by mouth daily.  Marland Kitchen ezetimibe (ZETIA) 10 MG tablet TAKE 1 TABLET BY MOUTH  DAILY  . Glucosamine-Chondroitin (GLUCOSAMINE CHONDR COMPLEX PO) Take 1 capsule by mouth daily.  . Influenza Vac Typ A&B Surf Ant 0.5 ML SUSY Inject as directed once.  . Influenza Virus Vacc Split PF (AFLURIA PRESERVATIVE FREE) 0.5 ML SUSY Inject as directed once.  . metoprolol succinate (TOPROL-XL) 25 MG 24 hr tablet TAKE 1 TABLET BY MOUTH EVERY DAY  . nabumetone (RELAFEN) 500 MG tablet Take 500 mg by mouth 2 (two) times daily.  . nitroGLYCERIN (NITROSTAT) 0.4 MG SL tablet TAKE 1 TABLET BY MOUTH UNDER THE TONGUE EVERY 5 MINUTES AS NEEDED FOR CHEST PAIN  . Omega-3 Fatty Acids (OMEGA 3 PO) Take 2 capsules by mouth daily.  Marland Kitchen PRALUENT 150 MG/ML SOAJ INJECT 150MG  SUBCUTANEOUSLY EVERY 2 WEEKS  . rosuvastatin (CRESTOR) 40  MG tablet TAKE 1 TABLET BY MOUTH  DAILY  . SHINGRIX injection Inject as directed once.  Marland Kitchen telmisartan (MICARDIS) 80 MG tablet Take 1 tablet by mouth daily.     Allergies:    Patient has no known allergies.   Social History: Social History   Socioeconomic History  . Marital status: Married    Spouse name: Not on file  . Number of children: Not on file  . Years of education: Not on file  . Highest education level: Not on file  Occupational History  . Not on file  Social Needs  . Financial resource strain: Not on file  . Food insecurity    Worry: Not on file    Inability: Not on file  .  Transportation needs    Medical: Not on file    Non-medical: Not on file  Tobacco Use  . Smoking status: Never Smoker  . Smokeless tobacco: Never Used  Substance and Sexual Activity  . Alcohol use: Yes    Alcohol/week: 5.0 standard drinks    Types: 5 drink(s) per week    Comment: Scothc or wine  . Drug use: Not on file  . Sexual activity: Not on file  Lifestyle  . Physical activity    Days per week: Not on file    Minutes per session: Not on file  . Stress: Not on file  Relationships  . Social Herbalist on phone: Not on file    Gets together: Not on file    Attends religious service: Not on file    Active member of club or organization: Not on file    Attends meetings of clubs or organizations: Not on file    Relationship status: Not on file  Other Topics Concern  . Not on file  Social History Narrative  . Not on file     Family History: The patient's family history includes Heart attack in his father; Heart disease in his brother; Hyperlipidemia in his brother and father.  ROS:   All other ROS reviewed and negative. Pertinent positives noted in the HPI.     EKGs/Labs/Other Studies Reviewed:   The following studies were personally reviewed by me today:  NM Stress (2016) Low risk stress nuclear study with small, mild mostly fixed (SDS 2) inferior defect suggestive of bowel attenuation artifact. LV Wall Motion:  NL LV Function; NL Wall Motion; EF 53%  EKG:  EKG is ordered today.  The ekg ordered today demonstrates normal sinus rhythm, heart rate 63, normal intervals, nonspecific ST-T abnormalities, no prior infarction, and was personally reviewed by me.   Recent Labs: 04/14/2019: ALT 20   Recent Lipid Panel    Component Value Date/Time   CHOL 127 04/14/2019 0853   CHOL 153 12/20/2015 0949   TRIG 86 04/14/2019 0853   TRIG 45 12/20/2015 0949   TRIG 72 12/06/2014 0944   HDL 75 04/14/2019 0853   HDL 76 12/20/2015 0949   HDL 94 12/06/2014 0944   CHOLHDL  1.7 04/14/2019 0853   CHOLHDL 2.0 12/20/2015 0949   LDLCALC 35 04/14/2019 0853   LDLCALC 68 12/20/2015 0949    Physical Exam:   VS:  BP 130/80   Pulse 65   Temp (!) 97.3 F (36.3 C)   Ht 5\' 8"  (1.727 m)   Wt 226 lb 12.8 oz (102.9 kg)   SpO2 96%   BMI 34.48 kg/m    Wt Readings from Last 3 Encounters:  07/10/19 226 lb  12.8 oz (102.9 kg)  12/08/18 228 lb (103.4 kg)  02/11/18 220 lb 9.6 oz (100.1 kg)    General: Well nourished, well developed, in no acute distress Heart: Atraumatic, normal size  Eyes: PEERLA, EOMI  Neck: Supple, no JVD Endocrine: No thryomegaly Cardiac: Normal S1, S2; RRR; no murmurs, rubs, or gallops Lungs: Clear to auscultation bilaterally, no wheezing, rhonchi or rales  Abd: Soft, nontender, no hepatomegaly  Ext: Trace edema, pulses 2+ Musculoskeletal: No deformities, BUE and BLE strength normal and equal Skin: Warm and dry, no rashes   Neuro: Alert and oriented to person, place, time, and situation, CNII-XII grossly intact, no focal deficits  Psych: Normal mood and affect   ASSESSMENT:   NAME@ is a 73 y.o. male who presents for the following: 1. SOB (shortness of breath) on exertion   2. Coronary artery disease due to lipid rich plaque   3. Familial hyperlipidemia     PLAN:   1. SOB (shortness of breath) on exertion -His recent change in symptoms are concerning.  Especially, he has a profound history of hyperlipidemia requiring PCSK9 inhibitor therapy.  I think it is reasonable proceed with a echocardiogram and repeat Lexiscan nuclear medicine perfusion study.  He was in agreement with this plan.  I will also check a BNP, as he has trace lower extremity edema on exam, but no overt evidence of volume overload.  Blood pressure and vitals well controlled.  Most recent LDL extremely impressive.  We will have him follow-up with Dr. Claiborne Billings in 1 to 2 months as he is able.  2. Coronary artery disease due to lipid rich plaque 3. Familial hyperlipidemia -We will  continue current management at this for now   Disposition: Return in about 2 months (around 09/09/2019) for Follow-up with Ellouise Newer, MD.  Medication Adjustments/Labs and Tests Ordered: Current medicines are reviewed at length with the patient today.  Concerns regarding medicines are outlined above.  Orders Placed This Encounter  Procedures  . B Nat Peptide  . Myocardial Perfusion Imaging  . EKG 12-Lead  . ECHOCARDIOGRAM COMPLETE   No orders of the defined types were placed in this encounter.   There are no Patient Instructions on file for this visit.   Signed, Addison Naegeli. Audie Box, [place]  [address] [place], [place] [address] [phone]  07/10/2019 11:11 AM

## 2019-07-10 ENCOUNTER — Ambulatory Visit: Payer: 59 | Admitting: Physician Assistant

## 2019-07-10 ENCOUNTER — Ambulatory Visit: Payer: 59 | Admitting: Cardiovascular Disease

## 2019-07-10 ENCOUNTER — Encounter: Payer: Self-pay | Admitting: Cardiovascular Disease

## 2019-07-10 ENCOUNTER — Other Ambulatory Visit: Payer: Self-pay

## 2019-07-10 VITALS — BP 130/80 | HR 65 | Temp 97.3°F | Ht 68.0 in | Wt 226.8 lb

## 2019-07-10 DIAGNOSIS — R0602 Shortness of breath: Secondary | ICD-10-CM

## 2019-07-10 DIAGNOSIS — I2510 Atherosclerotic heart disease of native coronary artery without angina pectoris: Secondary | ICD-10-CM | POA: Diagnosis not present

## 2019-07-10 DIAGNOSIS — E7849 Other hyperlipidemia: Secondary | ICD-10-CM | POA: Diagnosis not present

## 2019-07-10 DIAGNOSIS — I2583 Coronary atherosclerosis due to lipid rich plaque: Secondary | ICD-10-CM

## 2019-07-10 NOTE — Patient Instructions (Signed)
Medication Instructions:  Continue same medications If you need a refill on your cardiac medications before your next appointment, please call your pharmacy.   Lab work: BNP today If you have labs (blood work) drawn today and your tests are completely normal, you will receive your results only by: Marland Kitchen MyChart Message (if you have MyChart) OR . A paper copy in the mail If you have any lab test that is abnormal or we need to change your treatment, we will call you to review the results.  Testing/Procedures: Schedule Echo  Schedule Lexiscan Myoview  Follow-Up: At [facility], you and your health needs are our priority.  As part of our continuing mission to provide you with exceptional heart care, we have created designated Provider Care Teams.  These Care Teams include your primary Cardiologist (physician) and Advanced Practice Providers (APPs -  Physician Assistants and Nurse Practitioners) who all work together to provide you with the care you need, when you need it. . Schedule follow up appointment with Dr.Kelly in 2 months

## 2019-07-11 ENCOUNTER — Ambulatory Visit (HOSPITAL_COMMUNITY): Payer: 59 | Attending: Cardiovascular Disease

## 2019-07-11 ENCOUNTER — Telehealth (HOSPITAL_COMMUNITY): Payer: Self-pay

## 2019-07-11 DIAGNOSIS — R0602 Shortness of breath: Secondary | ICD-10-CM | POA: Diagnosis not present

## 2019-07-11 DIAGNOSIS — E7849 Other hyperlipidemia: Secondary | ICD-10-CM

## 2019-07-11 DIAGNOSIS — I2583 Coronary atherosclerosis due to lipid rich plaque: Secondary | ICD-10-CM | POA: Diagnosis not present

## 2019-07-11 DIAGNOSIS — I2510 Atherosclerotic heart disease of native coronary artery without angina pectoris: Secondary | ICD-10-CM

## 2019-07-11 LAB — BRAIN NATRIURETIC PEPTIDE: BNP: 81.2 pg/mL (ref 0.0–100.0)

## 2019-07-11 NOTE — Telephone Encounter (Signed)
Encounter complete. 

## 2019-07-13 ENCOUNTER — Other Ambulatory Visit: Payer: Self-pay

## 2019-07-13 ENCOUNTER — Telehealth: Payer: Self-pay | Admitting: Cardiovascular Disease

## 2019-07-13 ENCOUNTER — Telehealth: Payer: Self-pay

## 2019-07-13 ENCOUNTER — Ambulatory Visit (HOSPITAL_COMMUNITY)
Admission: RE | Admit: 2019-07-13 | Discharge: 2019-07-13 | Disposition: A | Discharge status: Home / Self Care | Payer: 59 | Source: Ambulatory Visit | Attending: Cardiovascular Disease | Admitting: Cardiovascular Disease

## 2019-07-13 DIAGNOSIS — R0602 Shortness of breath: Secondary | ICD-10-CM | POA: Diagnosis not present

## 2019-07-13 DIAGNOSIS — Z01812 Encounter for preprocedural laboratory examination: Secondary | ICD-10-CM

## 2019-07-13 DIAGNOSIS — I2510 Atherosclerotic heart disease of native coronary artery without angina pectoris: Secondary | ICD-10-CM

## 2019-07-13 DIAGNOSIS — E7849 Other hyperlipidemia: Secondary | ICD-10-CM | POA: Diagnosis not present

## 2019-07-13 DIAGNOSIS — R072 Precordial pain: Secondary | ICD-10-CM

## 2019-07-13 DIAGNOSIS — I2583 Coronary atherosclerosis due to lipid rich plaque: Secondary | ICD-10-CM

## 2019-07-13 LAB — MYOCARDIAL PERFUSION IMAGING
LV dias vol: 130 mL (ref 62–150)
LV sys vol: 79 mL
Peak HR: 91 {beats}/min
Rest HR: 63 {beats}/min
SDS: 10
SRS: 7
SSS: 17
TID: 1.63

## 2019-07-13 MED ORDER — TECHNETIUM TC 99M TETROFOSMIN IV KIT
10.6000 | PACK | Freq: Once | INTRAVENOUS | Status: AC | PRN
  Administered 2019-07-13: 10.6 via INTRAVENOUS
  Filled 2019-07-13: qty 11

## 2019-07-13 MED ORDER — TECHNETIUM TC 99M TETROFOSMIN IV KIT
30.8000 | PACK | Freq: Once | INTRAVENOUS | Status: AC | PRN
  Administered 2019-07-13: 30.8 via INTRAVENOUS
  Filled 2019-07-13: qty 31

## 2019-07-13 MED ORDER — REGADENOSON 0.4 MG/5ML IV SOLN
0.4000 mg | Freq: Once | INTRAVENOUS | Status: AC
  Administered 2019-07-13: 0.4 mg via INTRAVENOUS

## 2019-07-13 MED ORDER — PRALUENT 150 MG/ML ~~LOC~~ SOAJ: 150.0000 mg | SUBCUTANEOUS | 6 refills | Status: DC

## 2019-07-13 NOTE — Telephone Encounter (Signed)
Your cardiac CT will be scheduled at one of the below locations:   [facility] [address] [place], [place] [address] (336) [place] [address] [place], [place] [address] [phone]  Please arrive at the [facility] main entrance of [facility] 30-45 minutes prior to test start time. Proceed to the [facility] Radiology Department (first floor) to check-in and test prep.  Please follow these instructions carefully (unless otherwise directed):  Hold all erectile dysfunction medications at least 48 hours prior to test.  On the Night Before the Test: . Be sure to Drink plenty of water. . Do not consume any caffeinated/decaffeinated beverages or chocolate 12 hours prior to your test. . Do not take any antihistamines 12 hours prior to your test.   On the Day of the Test: . Drink plenty of water. Do not drink any water within one hour of the test. . Do not eat any food 4 hours prior to the test. . You may take your regular medications prior to the test.  . Take metoprolol (Lopressor) 50 mg two hours prior to test. . HOLD Hydrochlorothiazide morning of the test.    *For Clinical Staff only. Please instruct patient the following:*        -Drink plenty of water       -Hold Furosemide/hydrochlorothiazide morning of the test       -Take metoprolol (Lopressor) 50 mg 2 hours prior to test (if applicable).                         After the Test: . Drink plenty of water. . After receiving IV contrast, you may experience a mild flushed feeling. This is normal. . On occasion, you may experience a mild rash up to 24 hours after the test. This is not dangerous. If this occurs, you can take Benadryl 25 mg and increase your fluid intake. . If you experience trouble breathing, this can be serious. If it is severe call 911 IMMEDIATELY. If it is mild, please call our office.    Please contact the  cardiac imaging nurse navigator should you have any questions/concerns Marchia Bond, RN Navigator Cardiac Imaging [facility] [phone] Office  [phone] Cell

## 2019-07-13 NOTE — Telephone Encounter (Signed)
Called [name] [name] and discussed abnormal stress findings. Discuss CTA vs cath. He opted for CCTA. Will plan to increase his metoprolol succinate to 50 mg the day of his scan. No need for tartrate. HR ~60 at baseline. Recent kidney function normal. Will repeat BMP 1 week prior to his scan.   Evalina Field, MD

## 2019-07-14 ENCOUNTER — Other Ambulatory Visit: Payer: Self-pay

## 2019-08-01 ENCOUNTER — Telehealth (HOSPITAL_COMMUNITY): Payer: Self-pay | Admitting: Emergency Medicine

## 2019-08-01 NOTE — Telephone Encounter (Signed)
Left message on voicemail with name and callback number Shakoya Gilmore RN Navigator Cardiac Imaging [facility] [phone] Office [phone] Cell  

## 2019-08-02 LAB — BASIC METABOLIC PANEL
BUN/Creatinine Ratio: 17 (ref 10–24)
BUN: 18 mg/dL (ref 8–27)
CO2: 24 mmol/L (ref 20–29)
Calcium: 9.9 mg/dL (ref 8.6–10.2)
Chloride: 101 mmol/L (ref 96–106)
Creatinine, Ser: 1.06 mg/dL (ref 0.76–1.27)
GFR calc Af Amer: 80 mL/min/{1.73_m2} (ref >59)
GFR calc non Af Amer: 69 mL/min/{1.73_m2} (ref >59)
Glucose: 90 mg/dL (ref 65–99)
Potassium: 4.9 mmol/L (ref 3.5–5.2)
Sodium: 141 mmol/L (ref 134–144)

## 2019-08-03 ENCOUNTER — Ambulatory Visit (HOSPITAL_COMMUNITY): Payer: 59

## 2019-08-20 ENCOUNTER — Other Ambulatory Visit: Payer: Self-pay | Admitting: Cardiovascular Disease

## 2019-08-22 ENCOUNTER — Telehealth: Payer: Self-pay

## 2019-08-22 NOTE — Telephone Encounter (Signed)
Not needed

## 2019-08-23 ENCOUNTER — Telehealth: Payer: Self-pay | Admitting: Cardiovascular Disease

## 2019-08-23 NOTE — Telephone Encounter (Signed)
Discussed with [name] [name] the fact that his insurance is not willing to cover his cardiac CTA for his high-risk stress test. He reports worsening SOB with activity and symptoms are not getting better. I discussed with him that a diagnostic cath would be the best route given the delay in his care. He is OK to proceed with a diagnostic cath, but would like to be very involved in any decisions of PCI. 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 I will reach out to Dr. Ellouise Newer to get him scheduled for a left heart cath.   Evalina Field, MD

## 2019-08-23 NOTE — Telephone Encounter (Signed)
Message sent to precert requesting status of precert

## 2019-08-25 ENCOUNTER — Telehealth: Payer: Self-pay | Admitting: *Deleted

## 2019-08-25 DIAGNOSIS — I2510 Atherosclerotic heart disease of native coronary artery without angina pectoris: Secondary | ICD-10-CM

## 2019-08-25 DIAGNOSIS — I2583 Coronary atherosclerosis due to lipid rich plaque: Secondary | ICD-10-CM

## 2019-08-25 DIAGNOSIS — R9439 Abnormal result of other cardiovascular function study: Secondary | ICD-10-CM

## 2019-08-25 DIAGNOSIS — Z01818 Encounter for other preprocedural examination: Secondary | ICD-10-CM

## 2019-08-25 NOTE — Telephone Encounter (Signed)
Spoke with patient. Patient was aware  Left heart cath is needed to be schedule.   instructions given verbal and sent by mychart and patient  Aware labs and covid testing 08/28/19 and self isolate until Thursday Oct 22,2020

## 2019-08-26 NOTE — Telephone Encounter (Signed)
Orders in. -W

## 2019-08-28 ENCOUNTER — Other Ambulatory Visit (HOSPITAL_COMMUNITY)
Admission: RE | Admit: 2019-08-28 | Discharge: 2019-08-28 | Disposition: A | Discharge status: Home / Self Care | Payer: 59 | Source: Ambulatory Visit | Attending: Cardiovascular Disease | Admitting: Cardiovascular Disease

## 2019-08-28 DIAGNOSIS — Z01812 Encounter for preprocedural laboratory examination: Secondary | ICD-10-CM | POA: Diagnosis present

## 2019-08-28 DIAGNOSIS — Z20828 Contact with and (suspected) exposure to other viral communicable diseases: Secondary | ICD-10-CM | POA: Diagnosis not present

## 2019-08-28 NOTE — Telephone Encounter (Signed)
SPOKE TO PATIENT 08/25/19 - INSTRUCTION  GIVEN AND SENT VIA MYCHART

## 2019-08-29 ENCOUNTER — Telehealth: Payer: Self-pay | Admitting: *Deleted

## 2019-08-29 LAB — BASIC METABOLIC PANEL
BUN/Creatinine Ratio: 13 (ref 10–24)
BUN: 16 mg/dL (ref 8–27)
CO2: 25 mmol/L (ref 20–29)
Calcium: 9.4 mg/dL (ref 8.6–10.2)
Chloride: 100 mmol/L (ref 96–106)
Creatinine, Ser: 1.25 mg/dL (ref 0.76–1.27)
GFR calc Af Amer: 65 mL/min/{1.73_m2} (ref >59)
GFR calc non Af Amer: 56 mL/min/{1.73_m2} — ABNORMAL LOW (ref >59)
Glucose: 91 mg/dL (ref 65–99)
Potassium: 4.3 mmol/L (ref 3.5–5.2)
Sodium: 138 mmol/L (ref 134–144)

## 2019-08-29 LAB — CBC
Hematocrit: 41 % (ref 37.5–51.0)
Hemoglobin: 13.4 g/dL (ref 13.0–17.7)
MCH: 32.3 pg (ref 26.6–33.0)
MCHC: 32.7 g/dL (ref 31.5–35.7)
MCV: 99 fL — ABNORMAL HIGH (ref 79–97)
Platelets: 167 10*3/uL (ref 150–450)
RBC: 4.15 x10E6/uL (ref 4.14–5.80)
RDW: 13.1 % (ref 11.6–15.4)
WBC: 5.6 10*3/uL (ref 3.4–10.8)

## 2019-08-29 LAB — NOVEL CORONAVIRUS, NAA (HOSP ORDER, SEND-OUT TO REF LAB; TAT 18-24 HRS): SARS-CoV-2, NAA: NOT DETECTED

## 2019-08-29 NOTE — Telephone Encounter (Signed)
SPOKE WITH PATIENT -  INFORMED PATIENT WILL NEEDED A 30 DAY NOTE FOR  CATH ON 10 22/20. PATIENT IS AWARE WILLL HAVE A VIRTUAL VISIT WITH DR KELLY TOMORROW AT 9:20 AM

## 2019-08-30 ENCOUNTER — Telehealth (INDEPENDENT_AMBULATORY_CARE_PROVIDER_SITE_OTHER): Payer: 59 | Admitting: Cardiovascular Disease

## 2019-08-30 VITALS — BP 115/70 | HR 57 | Ht 68.0 in | Wt 214.0 lb

## 2019-08-30 DIAGNOSIS — I10 Essential (primary) hypertension: Secondary | ICD-10-CM

## 2019-08-30 DIAGNOSIS — Z6832 Body mass index (BMI) 32.0-32.9, adult: Secondary | ICD-10-CM

## 2019-08-30 DIAGNOSIS — E7849 Other hyperlipidemia: Secondary | ICD-10-CM

## 2019-08-30 DIAGNOSIS — R9439 Abnormal result of other cardiovascular function study: Secondary | ICD-10-CM | POA: Diagnosis not present

## 2019-08-30 DIAGNOSIS — R0602 Shortness of breath: Secondary | ICD-10-CM | POA: Diagnosis not present

## 2019-08-30 DIAGNOSIS — E6609 Other obesity due to excess calories: Secondary | ICD-10-CM

## 2019-08-30 DIAGNOSIS — I2510 Atherosclerotic heart disease of native coronary artery without angina pectoris: Secondary | ICD-10-CM | POA: Diagnosis not present

## 2019-08-30 DIAGNOSIS — I2583 Coronary atherosclerosis due to lipid rich plaque: Secondary | ICD-10-CM

## 2019-08-30 DIAGNOSIS — I25119 Atherosclerotic heart disease of native coronary artery with unspecified angina pectoris: Secondary | ICD-10-CM

## 2019-08-30 NOTE — H&P (View-Only) (Signed)
Virtual Visit via Video Note   This visit type was conducted due to national recommendations for restrictions regarding the COVID-19 Pandemic (e.g. social distancing) in an effort to limit this patient's exposure and mitigate transmission in our community.  Due to his co-morbid illnesses, this patient is at least at moderate risk for complications without adequate follow up.  This format is felt to be most appropriate for this patient at this time.  All issues noted in this document were discussed and addressed.  A limited physical exam was performed with this format.  Please refer to the patient's chart for his consent to telehealth for [facility].   Date:  08/30/2019   ID:  [name] [name], [name] [name] 1946-07-18, MRN [id number]  Patient Location: Home Provider Location: Home  PCP:   Cardiologist:  Shelva Majestic, MD Electrophysiologist:  None   Evaluation Performed:  Follow-Up Visit  Chief Complaint: Precardiac catheterization evaluation  History of Present Illness:    [name] [name] is a 73 y.o. male with who has a long-standing history of familial hyperlipidemia.  His father and one of his brothers had familial hyperlipidemia.   His father died at age 77 secondary to advanced coronary artery disease.  [name] [name] cholesterols in the past had been as high as 400.  He was initially diagnosed in the 1970s.  He has participated in numerous drug studies over the years and was one of the precipitants in the initial statin trials with Mevacor.  As part of one of his studies, he underwent diagnostic cardiac catheterization in [place].  He was asymptomatic with chest pain but was found to have mild coronary obstructive disease with 20-40% RCA stenoses, 20-30% circumflex stenoses,  At least moderate LAD stenosis with poststenotic dilatation involving the diagonal and septal perforating artery in 1987.  He has not been on medical therapy for CAD, with the exception of aggressive lipid-lowering  treatment.  His last nuclear perfusion study was in 2012, which continue to show normal perfusion.  There was attenuation artifact.  Post-rest ejection fraction was 53%. In June 2014 an echo Doppler study  showed an ejection fraction of 55-60%; Normal diastolic parameters.  He had mild left atrial dilatation.  There was mild aortic valve sclerosis without stenosis.  The patient tells me he has been aggressive with his follow-up.  He typically has laboratory checked via life extension on his own. Remotely, he also underwent VAP testing to determine particles size and numbers.  H e has been on Zetia 10 mg Crestor 40 mg, Niaspan 2000 mg and 2000 mg of omega-3 fatty acids.  I did review blood work that he had had in April 2015 from life extension and at that time on aggressive medical therapy.  His total cholesterol was 185, triglycerides 59, HDL cholesterol 82, LDL cholesterol 91.  Homocysteine was 11.7.  Hemoglobin A1c was 5.7.  Thyroid function studies were normal.  He was not anemic.  He had normal renal function.  He participated in this study looking at possible side effects from the injection site for PCSK9 inhibitor therapy.  He tells me his brother  underwent stenting of his coronary artery age 68.  The patient also has a history of gout, and hypertension.  He has been tolerating my Micardis 80 mg without side effects.  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  He denies chest pain.  He denies PND, orthopnea.  He denies palpitations.  In February 2016 he had an NMR LipoProfile.  Total cholesterol was  185, triglycerides 72, HDL 94, LDL C 77, and LDL particle number was excellent at 683.  Insulin resistance score was 31.  In March 2016  a nuclear perfusion study continued  to show normal perfusion and was low risk with probable bowel attenuation artifact.  As part of his own blood testing he had another lipid panel in August 2016 which showed a total cholesterol 140, HDL 50, LDL 77, and very normal  triglyceride levels.  He had purposeful weight loss of 30 pounds.  He tells me he had undergone hip surgery by Dr. Lonzo Cloud in [place].  He underwent an NMR LipoProfile on 12/20/2015.  Total cholesterol is 153, HDL 76, triglycerides 45, and calculated LDL was 68.  He had 1241, LDL particles which were predominantly large.  There were only 196 small LDL particles.  April lipoprotein B was normal at 61 as was LPa at 14.  He again had his labs rechecked as part of life extension, blood testing in June 2017.  He has a remote history of gout and has been taking a you.  All and uric acid was 3.3.  Renal function was normal.  He had normal LFTs.  Lipid studies revealed a total cholesterol of 167, triglycerides 46, HDL 85, and LDL 73.  Homocysteine was normal at 9.5.  PSA was 0.8.  Testosterone was low at 297.  Hemoglobin A1c was excellent at 5.3.  TSH was normal.  He was not anemic with hemoglobin of 13.5, hematocrit of 42.3.  MCV was minimally increased at 102.  Vitamin D level was normal at 95.7.  He had been taking vitamin D 10,000 units daily supplementation  Since I last saw him one year ago, he has remained active.  Over the past year he remains active.  He plays golf at least 2 days per week.  days a week.  He walks several other days.  He continues to work as a Engineer, petroleum.  He is widowed and not sexually active.   He underwent follow-up laboratory on 04/14/2017 at life extension.  BUN 16, creatinine 0.96.  Total cholesterol 189, triglycerides 71, HDL 89, LDL 86.  Homocysteine 14.5.  C-reactive protein 2.1.  Hemoglobin 14.1/hematocrit 42.8.  Serum testosterone 196.  TSH 2.6.  Vitamin D 50.2.  Apo lipoprotein B 81.    When I  saw him in December 2018, I had a long discussion with reference to PCSK 9 inhibition in light of his familial hyperlipidemia.  He has since been started on Praluent 150 mg every 2-week injections and had received 2 months of treatment.   Follow-up laboratory 2 days ago has shown a total cholesterol of 139, triglycerides 66, HDL 87, and LDL 39.  He has not had any side  effects to the medication.  His insurance company had denied Repatha.  He continues to be on concomitant therapy with Zetia 10 mg and rosuvastatin 40 mg.  He also is on Toprol-XL 25 mg and telmisartan 80 mg for hypertension.  He continues to work in a Scientist, physiological.  He recently returned from a two-week trip to [place] to visit his son who lives there.    I saw him in April 2019.  He continued to be asymptomatic with reference to chest pain, and did not have any change in exercise tolerance.  Lipid studies at that time revealed a total cholesterol 139, triglycerides 66, HDL 87 and LDL cholesterol was 39.  On his own, he underwent laboratory  by life extension in August 2019.  Chemistry was normal.  Fasting glucose was 83.  Total cholesterol 114, triglycerides 42, HDL 81, and LDL was 25.  Homocystine was normal.  Vitamin D level was 63.2.  Insulin level was normal at 5.7.  Apolipoprotein B was excellent at 32.  Life line screening screening was done by his own accord which he typically gets done every several years.  This now has shown improvement from previous evaluations with carotids now being totally normal without previous plaque.  His abdominal aorta was normal, there was no PVD.  Remains active.  He plays golf 2 times per week at a minimum.  He admits to weight gain over the holidays.  When I last saw him in January 2020, he was remaining fairly stable.  However, over the past 6 months he has noticed some mild gradual increase in exertional shortness of breath.  He saw his primary physician, Dr. Hulan Fess and due to his exertional component of dyspnea he referred him back to our office and he saw Dr. Grayling Congress on July 10, 2019 for further evaluation.  An echo Doppler study on July 11, 2019 revealed EF at 60 to 65% with grade 1 diastolic dysfunction.  He  was referred for a nuclear perfusion study which was done with a Lexiscan protocol and this showed clear change from his prior evaluations and was interpreted as high risk with transient ischemic dilatation, EF 39%, and ischemia in several distributions.  Apparently there was significant discussion with Dr. Davina Poke and an attempt at scheduling him for coronary CTA was unsuccessful.  After much discussion the decision was made to proceed with cardiac catheterization which initially the patient had some reservation.  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  Apparently his angiographic data was reviewed at [facility] and decision was not to proceed with intervention or bypass at that time and he has been on medical therapy for over 30 years.  Apparently, since the patient was notified of his abnormal results, he has on his own increased aspirin from 81 mg up to 325 mg and he had old prescription of Plavix which had been prescribed by Dr. Rollene Fare approximately 8 years ago.  For the past several weeks he put himself back on his oral Plavix medication and he stated that he had checked on the Internet and felt that even though it may not be as potent as it may have been when initially prescribed to him he felt that this provided some additional coverage until his catheterization.  He is scheduled to undergo cardiac catheterization tomorrow.  He had had his Covid test and is in quarantine until his catheterization study.  This virtual visit was done to review the recent testing, obtain further information and answer all of his questions particularly since his symptoms are new  since I last saw him in January 2020.   The patient does not have symptoms concerning for COVID-19 infection (fever, chills, cough, or new shortness of breath).    Past Medical History:  Diagnosis Date   Coronary  artery disease    Gout    Of the left third, fourth, and fifth MTPareas with swelling of left foot and pain   Hyperlipidemia    Hypertension    Past Surgical History:  Procedure Laterality Date   CARDIAC CATHETERIZATION  01/04/1986     Current Meds  Medication Sig   Alirocumab (PRALUENT) 150 MG/ML SOAJ Inject 150 mg into the skin every 14 (fourteen) days.   aspirin 325 MG tablet Take 81 mg by mouth every evening.    cetirizine (ZYRTEC) 10 MG tablet Take 10 mg by mouth every evening.    Cholecalciferol (VITAMIN D3) 125 MCG (5000 UT) CAPS Take 5,000 Units by mouth every evening.    clopidogrel (PLAVIX) 75 MG tablet Take 75 mg by mouth every evening.    ezetimibe (ZETIA) 10 MG tablet TAKE 1 TABLET BY MOUTH  DAILY (Patient taking differently: Take 10 mg by mouth every evening. )   hydrochlorothiazide (MICROZIDE) 12.5 MG capsule Take 1 capsule (12.5 mg total) by mouth daily. (Patient taking differently: Take 12.5 mg by mouth every evening. )   metoprolol succinate (TOPROL-XL) 25 MG 24 hr tablet TAKE 1 TABLET BY MOUTH EVERY DAY (Patient taking differently: Take 25 mg by mouth every evening. )   nitroGLYCERIN (NITROSTAT) 0.4 MG SL tablet TAKE 1 TABLET BY MOUTH UNDER THE TONGUE EVERY 5 MINUTES AS NEEDED FOR CHEST PAIN (Patient taking differently: Place 0.4 mg under the tongue every 5 (five) minutes as needed for chest pain. )   Omega-3 Fatty Acids (FISH OIL PO) Take 1,000 mg by mouth.   Oxymetazoline HCl (NASAL SPRAY) 0.05 % SOLN Place 1 spray into the nose daily as needed (congestion).   rosuvastatin (CRESTOR) 40 MG tablet TAKE 1 TABLET BY MOUTH  DAILY (Patient taking differently: Take 40 mg by mouth every evening. )   telmisartan (MICARDIS) 80 MG tablet Take 40 mg by mouth every evening.      Allergies:   Patient has no known allergies.   Social History   Tobacco Use   Smoking status: Never Smoker   Smokeless tobacco: Never Used  Substance Use Topics   Alcohol use:  Yes    Alcohol/week: 5.0 standard drinks    Types: 5 drink(s) per week    Comment: Scothc or wine   Drug use: Not on file     Family Hx: The patient's family history includes Heart attack in his father; Heart disease in his brother; Hyperlipidemia in his brother and father.  ROS:   Please see the history of present illness.    No fevers chills or night sweats. No cough Positive exertional dyspnea with exertional chest pressure; symptoms resolved with rest No abdominal discomfort No edema No myalgias Sleeping well  All other systems reviewed and are negative.   Prior CV studies:   The following studies were reviewed today:  Study Highlights    The left ventricular ejection fraction is moderately decreased (30-44%).  Nuclear stress EF: 39%.  Horizontal ST segment depression ST segment depression was noted during stress in the II, III, aVF, V5 and V6 leads, and returning to baseline after 1-5 minutes of recovery.  Defect 1: There is a large defect of moderate severity present in the basal anteroseptal, basal inferoseptal, basal inferior, basal inferolateral, mid inferoseptal, mid inferior and apical inferior location.  Defect 2: There is a large defect of severe severity present in the basal anterior, mid anterior, mid anteroseptal, apical anterior, apical septal and apex location.  Findings consistent with ischemia and prior myocardial infarction.  This is a high risk study.  Changed from prior study.   TID very abnormal at >1.6, suggests ischemic dilitation. Severely abnormal stress test, EF estimated at 39% but there is both hypokinesis and dyskinesis (apical and distal septal). Severely reduced perfusion with severe stress  defect in anterior and anteroseptal regions as well as fixed defect inferior/inferoseptal. Very concerning for severe ischemia and likely infarct.      Labs/Other Tests and Data Reviewed:    EKG:  An ECG dated 07/10/2019 was personally  reviewed today and demonstrated:  NSR at 63 bpm, nonspecific T changes  Recent Labs: 04/14/2019: ALT 20 07/10/2019: BNP 81.2 08/28/2019: BUN 16; Creatinine, Ser 1.25; Hemoglobin 13.4; Platelets 167; Potassium 4.3; Sodium 138   Recent Lipid Panel Lab Results  Component Value Date/Time   CHOL 127 04/14/2019 08:53 AM   CHOL 153 12/20/2015 09:49 AM   TRIG 86 04/14/2019 08:53 AM   TRIG 45 12/20/2015 09:49 AM   TRIG 72 12/06/2014 09:44 AM   HDL 75 04/14/2019 08:53 AM   HDL 76 12/20/2015 09:49 AM   HDL 94 12/06/2014 09:44 AM   CHOLHDL 1.7 04/14/2019 08:53 AM   CHOLHDL 2.0 12/20/2015 09:49 AM   LDLCALC 35 04/14/2019 08:53 AM   LDLCALC 68 12/20/2015 09:49 AM    Wt Readings from Last 3 Encounters:  08/30/19 214 lb (97.1 kg)  07/13/19 226 lb (102.5 kg)  07/10/19 226 lb 12.8 oz (102.9 kg)     Objective:    Vital Signs:  BP 115/70    Pulse (!) 57    Ht 5\' 8"  (1.727 m)    Wt 214 lb (97.1 kg)    BMI 32.54 kg/m    Visually his physical appearance is unchanged from previous evaluation Breathing is normal nonlabored HEENT appears grossly normal No obvious JVD No audible wheezing No superficial chest discomfort to palpation Nontender abdomen No edema Neurologically appears grossly normal Normal cognition and affect   ASSESSMENT & PLAN:    1. Coronary artery disease due to lipid rich plaque   2. Abnormal nuclear stress test   3. SOB (shortness of breath) on exertion   4. Familial hyperlipidemia   5. Essential hypertension   6. Atherosclerosis of native coronary artery of native heart with angina pectoris (HCC)   7. Class 1 obesity due to excess calories without serious comorbidity with body mass index (BMI) of 32.0 to 32.9 in adult     I had a very long discussion with [name] [name].  He has a significant history of familial hyperlipidemia dating back to the 1970s when he was told of having cholesterols in excess of 400.  He has strong family history for premature heart disease in  several family members with his father dying at age 15 secondary to advanced CAD.  He has participated in numerous drug trials.  He also has undergone several cardiac catheterizations and reportedly a catheterization 30 years ago he was found to have significant blockage in a branch vessel with additional abnormalities but also was found to have concern of aneurysmal dilatation.  Apparently his cath studies were reviewed at [facility].  Options were discussed with the patient including angioplasty versus bypass surgery at some point and and he has been on medical therapy ever since.  He has done remarkably well and in particular since initiation of PCSK9 inhibition lipid studies have been remarkable with LDL cholesterols in the 20s and 30s and attempt to induce plaque regression and prevent progressive CAD.  Since I last saw him he has developed exertional dyspnea and chest tightness which has progressed over the past several months.  I thoroughly reviewed the evaluation by Dr. Grayling Congress and his subsequent echo Doppler and nuclear perfusion studies.  His echo Doppler study reveals normal systolic function  without wall motion abnormality, but his nuclear study is high risk and reveals transient ischemic dilatation, and multiple areas of ischemia with an EF post-rest at 39%.  I had a very long discussion with him regarding definitive cardiac catheterization and the data that this will provide.  We discussed options of continued medical therapy, possible coronary intervention, or possible evaluation for CABG revascularization.  Apparently the patient had self increased his aspirin from 81 mg to 325 mg and also 1C new his study was significantly abnormal he started to take oral Plavix pills that he had had that are at least 21 to 74 years old and were prescribed by Dr. Rollene Fare.  He is aware that these may not be as potent as they were but he still believes that there has been some potential benefit in using them.  I have  recommended he reduce his aspirin to 81 mg and discontinue the old Plavix treatment.  He is scheduled to undergo definitive cardiac catheterization with me tomorrow.  I reviewed the procedure with him in detail and including the vascular access may be radial artery and the femoral route as a potential backup if needed.The risks and benefits of a cardiac catheterization including, but not limited to, death, stroke, MI, kidney damage and bleeding were discussed with the patient who indicates understanding and agrees to proceed.  I reviewed his preoperative laboratory.  He consents undergo this planned procedure tomorrow.    COVID-19 Education: The signs and symptoms of COVID-19 were discussed with the patient and how to seek care for testing (follow up with PCP or arrange E-visit).  The importance of social distancing was discussed today.  Time:   Today, I have spent 50 minutes with the patient with telehealth technology discussing the above problems.     Medication Adjustments/Labs and Tests Ordered: Current medicines are reviewed at length with the patient today.  Concerns regarding medicines are outlined above.   Tests Ordered: No orders of the defined types were placed in this encounter.   Medication Changes: No orders of the defined types were placed in this encounter.   Follow Up: Plan catheterization with possible PCI tomorrow.  Depending upon results, follow-up evaluation in the office.  Signed, Shelva Majestic, MD  08/30/2019 2:03 PM    [place]

## 2019-08-30 NOTE — Progress Notes (Signed)
Virtual Visit via Video Note   This visit type was conducted due to national recommendations for restrictions regarding the COVID-19 Pandemic (e.g. social distancing) in an effort to limit this patient's exposure and mitigate transmission in our community.  Due to his co-morbid illnesses, this patient is at least at moderate risk for complications without adequate follow up.  This format is felt to be most appropriate for this patient at this time.  All issues noted in this document were discussed and addressed.  A limited physical exam was performed with this format.  Please refer to the patient's chart for his consent to telehealth for [facility]   Date:  08/30/2019   ID:  [name], [name] 1946/03/21, MRN [id number]  Patient Location: Home Provider Location: Home  PCP:   Cardiologist:  Shelva Majestic, MD Electrophysiologist:  None   Evaluation Performed:  Follow-Up Visit  Chief Complaint: Precardiac catheterization evaluation  History of Present Illness:    [name] [name] is a 73 y.o. male with who has a long-standing history of familial hyperlipidemia.  His father and one of his brothers had familial hyperlipidemia.   His father died at age 52 secondary to advanced coronary artery disease.  [name] cholesterols in the past had been as high as 400.  He was initially diagnosed in the 1970s.  He has participated in numerous drug studies over the years and was one of the precipitants in the initial statin trials with Mevacor.  As part of one of his studies, he underwent diagnostic cardiac catheterization in [place].  He was asymptomatic with chest pain but was found to have mild coronary obstructive disease with 20-40% RCA stenoses, 20-30% circumflex stenoses,  At least moderate LAD stenosis with poststenotic dilatation involving the diagonal and septal perforating artery in 1987.  He has not been on medical therapy for CAD, with the exception of aggressive lipid-lowering  treatment.  His last nuclear perfusion study was in 2012, which continue to show normal perfusion.  There was attenuation artifact.  Post-rest ejection fraction was 53%. In June 2014 an echo Doppler study  showed an ejection fraction of 55-60%; Normal diastolic parameters.  He had mild left atrial dilatation.  There was mild aortic valve sclerosis without stenosis.  The patient tells me he has been aggressive with his follow-up.  He typically has laboratory checked via life extension on his own. Remotely, he also underwent VAP testing to determine particles size and numbers.  H e has been on Zetia 10 mg Crestor 40 mg, Niaspan 2000 mg and 2000 mg of omega-3 fatty acids.  I did review blood work that he had had in April 2015 from life extension and at that time on aggressive medical therapy.  His total cholesterol was 185, triglycerides 59, HDL cholesterol 82, LDL cholesterol 91.  Homocysteine was 11.7.  Hemoglobin A1c was 5.7.  Thyroid function studies were normal.  He was not anemic.  He had normal renal function.  He participated in this study looking at possible side effects from the injection site for PCSK9 inhibitor therapy.  He tells me his brother  underwent stenting of his coronary artery age 101.  The patient also has a history of gout, and hypertension.  He has been tolerating my Micardis 80 mg without side effects.  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  He denies chest pain.  He denies PND, orthopnea.  He denies palpitations.  In February 2016 he had an NMR LipoProfile.  Total cholesterol was  185, triglycerides 72, HDL 94, LDL C 77, and LDL particle number was excellent at 683.  Insulin resistance score was 31.  In March 2016  a nuclear perfusion study continued  to show normal perfusion and was low risk with probable bowel attenuation artifact.  As part of his own blood testing he had another lipid panel in August 2016 which showed a total cholesterol 140, HDL 50, LDL 77, and very normal  triglyceride levels.  He had purposeful weight loss of 30 pounds.  He tells me he had undergone hip surgery by Dr. Lonzo Cloud in [place], [place].  He underwent an NMR LipoProfile on 12/20/2015.  Total cholesterol is 153, HDL 76, triglycerides 45, and calculated LDL was 68.  He had 1241, LDL particles which were predominantly large.  There were only 196 small LDL particles.  April lipoprotein B was normal at 61 as was LPa at 14.  He again had his labs rechecked as part of life extension, blood testing in June 2017.  He has a remote history of gout and has been taking a you.  All and uric acid was 3.3.  Renal function was normal.  He had normal LFTs.  Lipid studies revealed a total cholesterol of 167, triglycerides 46, HDL 85, and LDL 73.  Homocysteine was normal at 9.5.  PSA was 0.8.  Testosterone was low at 297.  Hemoglobin A1c was excellent at 5.3.  TSH was normal.  He was not anemic with hemoglobin of 13.5, hematocrit of 42.3.  MCV was minimally increased at 102.  Vitamin D level was normal at 95.7.  He had been taking vitamin D 10,000 units daily supplementation  Since I last saw him one year ago, he has remained active.  Over the past year he remains active.  He plays golf at least 2 days per week.  days a week.  He walks several other days.  He continues to work as a Engineer, petroleum.  He is widowed and not sexually active.   He underwent follow-up laboratory on 04/14/2017 at life extension.  BUN 16, creatinine 0.96.  Total cholesterol 189, triglycerides 71, HDL 89, LDL 86.  Homocysteine 14.5.  C-reactive protein 2.1.  Hemoglobin 14.1/hematocrit 42.8.  Serum testosterone 196.  TSH 2.6.  Vitamin D 50.2.  Apo lipoprotein B 81.    When I  saw him in December 2018, I had a long discussion with reference to PCSK 9 inhibition in light of his familial hyperlipidemia.  He has since been started on Praluent 150 mg every 2-week injections and had received 2 months of treatment.   Follow-up laboratory 2 days ago has shown a total cholesterol of 139, triglycerides 66, HDL 87, and LDL 39.  He has not had any side  effects to the medication.  His insurance company had denied Repatha.  He continues to be on concomitant therapy with Zetia 10 mg and rosuvastatin 40 mg.  He also is on Toprol-XL 25 mg and telmisartan 80 mg for hypertension.  He continues to work in a Scientist, physiological.  He recently returned from a two-week trip to [place] to visit his son who lives there.    I saw him in April 2019.  He continued to be asymptomatic with reference to chest pain, and did not have any change in exercise tolerance.  Lipid studies at that time revealed a total cholesterol 139, triglycerides 66, HDL 87 and LDL cholesterol was 39.  On his own, he underwent laboratory  by life extension in August 2019.  Chemistry was normal.  Fasting glucose was 83.  Total cholesterol 114, triglycerides 42, HDL 81, and LDL was 25.  Homocystine was normal.  Vitamin D level was 63.2.  Insulin level was normal at 5.7.  Apolipoprotein B was excellent at 32.  Life line screening screening was done by his own accord which he typically gets done every several years.  This now has shown improvement from previous evaluations with carotids now being totally normal without previous plaque.  His abdominal aorta was normal, there was no PVD.  Remains active.  He plays golf 2 times per week at a minimum.  He admits to weight gain over the holidays.  When I last saw him in January 2020, he was remaining fairly stable.  However, over the past 6 months he has noticed some mild gradual increase in exertional shortness of breath.  He saw his primary physician, Dr. Hulan Fess and due to his exertional component of dyspnea he referred him back to our office and he saw Dr. Grayling Congress on July 10, 2019 for further evaluation.  An echo Doppler study on July 11, 2019 revealed EF at 60 to 65% with grade 1 diastolic dysfunction.  He  was referred for a nuclear perfusion study which was done with a Lexiscan protocol and this showed clear change from his prior evaluations and was interpreted as high risk with transient ischemic dilatation, EF 39%, and ischemia in several distributions.  Apparently there was significant discussion with Dr. Davina Poke and an attempt at scheduling him for coronary CTA was unsuccessful.  After much discussion the decision was made to proceed with cardiac catheterization which initially the patient had some reservation.  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  Apparently his angiographic data was reviewed at [facility] and decision was not to proceed with intervention or bypass at that time and he has been on medical therapy for over 30 years.  Apparently, since the patient was notified of his abnormal results, he has on his own increased aspirin from 81 mg up to 325 mg and he had old prescription of Plavix which had been prescribed by Dr. Rollene Fare approximately 8 years ago.  For the past several weeks he put himself back on his oral Plavix medication and he stated that he had checked on the Internet and felt that even though it may not be as potent as it may have been when initially prescribed to him he felt that this provided some additional coverage until his catheterization.  He is scheduled to undergo cardiac catheterization tomorrow.  He had had his Covid test and is in quarantine until his catheterization study.  This virtual visit was done to review the recent testing, obtain further information and answer all of his questions particularly since his symptoms are new  since I last saw him in January 2020.   The patient does not have symptoms concerning for COVID-19 infection (fever, chills, cough, or new shortness of breath).    Past Medical History:  Diagnosis Date   Coronary  artery disease    Gout    Of the left third, fourth, and fifth MTPareas with swelling of left foot and pain   Hyperlipidemia    Hypertension    Past Surgical History:  Procedure Laterality Date   CARDIAC CATHETERIZATION  01/04/1986     Current Meds  Medication Sig   Alirocumab (PRALUENT) 150 MG/ML SOAJ Inject 150 mg into the skin every 14 (fourteen) days.   aspirin 325 MG tablet Take 81 mg by mouth every evening.    cetirizine (ZYRTEC) 10 MG tablet Take 10 mg by mouth every evening.    Cholecalciferol (VITAMIN D3) 125 MCG (5000 UT) CAPS Take 5,000 Units by mouth every evening.    clopidogrel (PLAVIX) 75 MG tablet Take 75 mg by mouth every evening.    ezetimibe (ZETIA) 10 MG tablet TAKE 1 TABLET BY MOUTH  DAILY (Patient taking differently: Take 10 mg by mouth every evening. )   hydrochlorothiazide (MICROZIDE) 12.5 MG capsule Take 1 capsule (12.5 mg total) by mouth daily. (Patient taking differently: Take 12.5 mg by mouth every evening. )   metoprolol succinate (TOPROL-XL) 25 MG 24 hr tablet TAKE 1 TABLET BY MOUTH EVERY DAY (Patient taking differently: Take 25 mg by mouth every evening. )   nitroGLYCERIN (NITROSTAT) 0.4 MG SL tablet TAKE 1 TABLET BY MOUTH UNDER THE TONGUE EVERY 5 MINUTES AS NEEDED FOR CHEST PAIN (Patient taking differently: Place 0.4 mg under the tongue every 5 (five) minutes as needed for chest pain. )   Omega-3 Fatty Acids (FISH OIL PO) Take 1,000 mg by mouth.   Oxymetazoline HCl (NASAL SPRAY) 0.05 % SOLN Place 1 spray into the nose daily as needed (congestion).   rosuvastatin (CRESTOR) 40 MG tablet TAKE 1 TABLET BY MOUTH  DAILY (Patient taking differently: Take 40 mg by mouth every evening. )   telmisartan (MICARDIS) 80 MG tablet Take 40 mg by mouth every evening.      Allergies:   Patient has no known allergies.   Social History   Tobacco Use   Smoking status: Never Smoker   Smokeless tobacco: Never Used  Substance Use Topics   Alcohol use:  Yes    Alcohol/week: 5.0 standard drinks    Types: 5 drink(s) per week    Comment: Scothc or wine   Drug use: Not on file     Family Hx: The patient's family history includes Heart attack in his father; Heart disease in his brother; Hyperlipidemia in his brother and father.  ROS:   Please see the history of present illness.    No fevers chills or night sweats. No cough Positive exertional dyspnea with exertional chest pressure; symptoms resolved with rest No abdominal discomfort No edema No myalgias Sleeping well  All other systems reviewed and are negative.   Prior CV studies:   The following studies were reviewed today:  Study Highlights    The left ventricular ejection fraction is moderately decreased (30-44%).  Nuclear stress EF: 39%.  Horizontal ST segment depression ST segment depression was noted during stress in the II, III, aVF, V5 and V6 leads, and returning to baseline after 1-5 minutes of recovery.  Defect 1: There is a large defect of moderate severity present in the basal anteroseptal, basal inferoseptal, basal inferior, basal inferolateral, mid inferoseptal, mid inferior and apical inferior location.  Defect 2: There is a large defect of severe severity present in the basal anterior, mid anterior, mid anteroseptal, apical anterior, apical septal and apex location.  Findings consistent with ischemia and prior myocardial infarction.  This is a high risk study.  Changed from prior study.   TID very abnormal at >1.6, suggests ischemic dilitation. Severely abnormal stress test, EF estimated at 39% but there is both hypokinesis and dyskinesis (apical and distal septal). Severely reduced perfusion with severe stress  defect in anterior and anteroseptal regions as well as fixed defect inferior/inferoseptal. Very concerning for severe ischemia and likely infarct.      Labs/Other Tests and Data Reviewed:    EKG:  An ECG dated 07/10/2019 was personally  reviewed today and demonstrated:  NSR at 63 bpm, nonspecific T changes  Recent Labs: 04/14/2019: ALT 20 07/10/2019: BNP 81.2 08/28/2019: BUN 16; Creatinine, Ser 1.25; Hemoglobin 13.4; Platelets 167; Potassium 4.3; Sodium 138   Recent Lipid Panel Lab Results  Component Value Date/Time   CHOL 127 04/14/2019 08:53 AM   CHOL 153 12/20/2015 09:49 AM   TRIG 86 04/14/2019 08:53 AM   TRIG 45 12/20/2015 09:49 AM   TRIG 72 12/06/2014 09:44 AM   HDL 75 04/14/2019 08:53 AM   HDL 76 12/20/2015 09:49 AM   HDL 94 12/06/2014 09:44 AM   CHOLHDL 1.7 04/14/2019 08:53 AM   CHOLHDL 2.0 12/20/2015 09:49 AM   LDLCALC 35 04/14/2019 08:53 AM   LDLCALC 68 12/20/2015 09:49 AM    Wt Readings from Last 3 Encounters:  08/30/19 214 lb (97.1 kg)  07/13/19 226 lb (102.5 kg)  07/10/19 226 lb 12.8 oz (102.9 kg)     Objective:    Vital Signs:  BP 115/70    Pulse (!) 57    Ht 5\' 8"  (1.727 m)    Wt 214 lb (97.1 kg)    BMI 32.54 kg/m    Visually his physical appearance is unchanged from previous evaluation Breathing is normal nonlabored HEENT appears grossly normal No obvious JVD No audible wheezing No superficial chest discomfort to palpation Nontender abdomen No edema Neurologically appears grossly normal Normal cognition and affect   ASSESSMENT & PLAN:    1. Coronary artery disease due to lipid rich plaque   2. Abnormal nuclear stress test   3. SOB (shortness of breath) on exertion   4. Familial hyperlipidemia   5. Essential hypertension   6. Atherosclerosis of native coronary artery of native heart with angina pectoris (HCC)   7. Class 1 obesity due to excess calories without serious comorbidity with body mass index (BMI) of 32.0 to 32.9 in adult     I had a very long discussion with Mr. Harvest Dark.  He has a significant history of familial hyperlipidemia dating back to the 1970s when he was told of having cholesterols in excess of 400.  He has strong family history for premature heart disease in  several family members with his father dying at age 45 secondary to advanced CAD.  He has participated in numerous drug trials.  He also has undergone several cardiac catheterizations and reportedly a catheterization 30 years ago he was found to have significant blockage in a branch vessel with additional abnormalities but also was found to have concern of aneurysmal dilatation.  Apparently his cath studies were reviewed at [facility].  Options were discussed with the patient including angioplasty versus bypass surgery at some point and and he has been on medical therapy ever since.  He has done remarkably well and in particular since initiation of PCSK9 inhibition lipid studies have been remarkable with LDL cholesterols in the 20s and 30s and attempt to induce plaque regression and prevent progressive CAD.  Since I last saw him he has developed exertional dyspnea and chest tightness which has progressed over the past several months.  I thoroughly reviewed the evaluation by Dr. Grayling Congress and his subsequent echo Doppler and nuclear perfusion studies.  His echo Doppler study reveals normal systolic function  without wall motion abnormality, but his nuclear study is high risk and reveals transient ischemic dilatation, and multiple areas of ischemia with an EF post-rest at 39%.  I had a very long discussion with him regarding definitive cardiac catheterization and the data that this will provide.  We discussed options of continued medical therapy, possible coronary intervention, or possible evaluation for CABG revascularization.  Apparently the patient had self increased his aspirin from 81 mg to 325 mg and also 1C new his study was significantly abnormal he started to take oral Plavix pills that he had had that are at least 72 to 74 years old and were prescribed by Dr. Rollene Fare.  He is aware that these may not be as potent as they were but he still believes that there has been some potential benefit in using them.  I have  recommended he reduce his aspirin to 81 mg and discontinue the old Plavix treatment.  He is scheduled to undergo definitive cardiac catheterization with me tomorrow.  I reviewed the procedure with him in detail and including the vascular access may be radial artery and the femoral route as a potential backup if needed.The risks and benefits of a cardiac catheterization including, but not limited to, death, stroke, MI, kidney damage and bleeding were discussed with the patient who indicates understanding and agrees to proceed.  I reviewed his preoperative laboratory.  He consents undergo this planned procedure tomorrow.    COVID-19 Education: The signs and symptoms of COVID-19 were discussed with the patient and how to seek care for testing (follow up with PCP or arrange E-visit).  The importance of social distancing was discussed today.  Time:   Today, I have spent 50 minutes with the patient with telehealth technology discussing the above problems.     Medication Adjustments/Labs and Tests Ordered: Current medicines are reviewed at length with the patient today.  Concerns regarding medicines are outlined above.   Tests Ordered: No orders of the defined types were placed in this encounter.   Medication Changes: No orders of the defined types were placed in this encounter.   Follow Up: Plan catheterization with possible PCI tomorrow.  Depending upon results, follow-up evaluation in the office.  Signed, Shelva Majestic, MD  08/30/2019 2:03 PM    [place]

## 2019-08-30 NOTE — Patient Instructions (Signed)
Medication Instructions:  The current medical regimen is effective;  continue present plan and medications.  *If you need a refill on your cardiac medications before your next appointment, please call your pharmacy*  Follow-Up: At [facility], you and your health needs are our priority.  As part of our continuing mission to provide you with exceptional heart care, we have created designated Provider Care Teams.  These Care Teams include your primary Cardiologist (physician) and Advanced Practice Providers (APPs -  Physician Assistants and Nurse Practitioners) who all work together to provide you with the care you need, when you need it.  Your next appointment:   Keep appointment as scheduled.  The format for your next appointment:   In Person  Provider:   Shelva Majestic, MD

## 2019-08-31 ENCOUNTER — Encounter (HOSPITAL_COMMUNITY): Payer: Self-pay | Admitting: Cardiovascular Disease

## 2019-08-31 ENCOUNTER — Inpatient Hospital Stay (HOSPITAL_COMMUNITY)
Admission: RE | Admit: 2019-08-31 | Discharge: 2019-09-07 | DRG: 234 | Disposition: A | Discharge status: Home / Self Care | Payer: 59 | Attending: Thoracic Surgery (Cardiothoracic Vascular Surgery) | Admitting: Thoracic Surgery (Cardiothoracic Vascular Surgery)

## 2019-08-31 ENCOUNTER — Inpatient Hospital Stay (HOSPITAL_COMMUNITY): Payer: 59

## 2019-08-31 ENCOUNTER — Other Ambulatory Visit: Payer: Self-pay

## 2019-08-31 ENCOUNTER — Encounter (HOSPITAL_COMMUNITY)
Admission: RE | Disposition: A | Discharge status: Home / Self Care | Payer: Self-pay | Source: Home / Self Care | Attending: Thoracic Surgery (Cardiothoracic Vascular Surgery)

## 2019-08-31 DIAGNOSIS — I25110 Atherosclerotic heart disease of native coronary artery with unstable angina pectoris: Secondary | ICD-10-CM | POA: Diagnosis present

## 2019-08-31 DIAGNOSIS — Z951 Presence of aortocoronary bypass graft: Secondary | ICD-10-CM | POA: Diagnosis not present

## 2019-08-31 DIAGNOSIS — Z9889 Other specified postprocedural states: Secondary | ICD-10-CM | POA: Diagnosis not present

## 2019-08-31 DIAGNOSIS — M79604 Pain in right leg: Secondary | ICD-10-CM | POA: Diagnosis not present

## 2019-08-31 DIAGNOSIS — Z79899 Other long term (current) drug therapy: Secondary | ICD-10-CM | POA: Diagnosis not present

## 2019-08-31 DIAGNOSIS — Z8349 Family history of other endocrine, nutritional and metabolic diseases: Secondary | ICD-10-CM | POA: Diagnosis not present

## 2019-08-31 DIAGNOSIS — Z8249 Family history of ischemic heart disease and other diseases of the circulatory system: Secondary | ICD-10-CM | POA: Diagnosis not present

## 2019-08-31 DIAGNOSIS — Z20828 Contact with and (suspected) exposure to other viral communicable diseases: Secondary | ICD-10-CM | POA: Diagnosis present

## 2019-08-31 DIAGNOSIS — M109 Gout, unspecified: Secondary | ICD-10-CM | POA: Diagnosis present

## 2019-08-31 DIAGNOSIS — Z7982 Long term (current) use of aspirin: Secondary | ICD-10-CM | POA: Diagnosis not present

## 2019-08-31 DIAGNOSIS — E669 Obesity, unspecified: Secondary | ICD-10-CM | POA: Diagnosis present

## 2019-08-31 DIAGNOSIS — Z09 Encounter for follow-up examination after completed treatment for conditions other than malignant neoplasm: Secondary | ICD-10-CM | POA: Diagnosis not present

## 2019-08-31 DIAGNOSIS — I2583 Coronary atherosclerosis due to lipid rich plaque: Secondary | ICD-10-CM | POA: Diagnosis present

## 2019-08-31 DIAGNOSIS — R918 Other nonspecific abnormal finding of lung field: Secondary | ICD-10-CM | POA: Diagnosis not present

## 2019-08-31 DIAGNOSIS — E8881 Metabolic syndrome: Secondary | ICD-10-CM | POA: Diagnosis present

## 2019-08-31 DIAGNOSIS — I9581 Postprocedural hypotension: Secondary | ICD-10-CM | POA: Diagnosis not present

## 2019-08-31 DIAGNOSIS — E7849 Other hyperlipidemia: Secondary | ICD-10-CM | POA: Diagnosis present

## 2019-08-31 DIAGNOSIS — Z6832 Body mass index (BMI) 32.0-32.9, adult: Secondary | ICD-10-CM

## 2019-08-31 DIAGNOSIS — I200 Unstable angina: Secondary | ICD-10-CM | POA: Diagnosis not present

## 2019-08-31 DIAGNOSIS — D62 Acute posthemorrhagic anemia: Secondary | ICD-10-CM | POA: Diagnosis not present

## 2019-08-31 DIAGNOSIS — Z7902 Long term (current) use of antithrombotics/antiplatelets: Secondary | ICD-10-CM

## 2019-08-31 DIAGNOSIS — D6959 Other secondary thrombocytopenia: Secondary | ICD-10-CM | POA: Diagnosis not present

## 2019-08-31 DIAGNOSIS — Z01810 Encounter for preprocedural cardiovascular examination: Secondary | ICD-10-CM | POA: Diagnosis not present

## 2019-08-31 DIAGNOSIS — I959 Hypotension, unspecified: Secondary | ICD-10-CM | POA: Diagnosis not present

## 2019-08-31 DIAGNOSIS — R600 Localized edema: Secondary | ICD-10-CM | POA: Diagnosis not present

## 2019-08-31 DIAGNOSIS — I358 Other nonrheumatic aortic valve disorders: Secondary | ICD-10-CM | POA: Diagnosis present

## 2019-08-31 DIAGNOSIS — I10 Essential (primary) hypertension: Secondary | ICD-10-CM | POA: Diagnosis present

## 2019-08-31 DIAGNOSIS — R710 Precipitous drop in hematocrit: Secondary | ICD-10-CM | POA: Diagnosis not present

## 2019-08-31 DIAGNOSIS — R079 Chest pain, unspecified: Secondary | ICD-10-CM

## 2019-08-31 HISTORY — PX: LEFT HEART CATH AND CORONARY ANGIOGRAPHY: CATH118249

## 2019-08-31 LAB — URINALYSIS, ROUTINE W REFLEX MICROSCOPIC
Bilirubin Urine: NEGATIVE
Glucose, UA: NEGATIVE mg/dL
Hgb urine dipstick: NEGATIVE
Ketones, ur: NEGATIVE mg/dL
Leukocytes,Ua: NEGATIVE
Nitrite: NEGATIVE
Protein, ur: NEGATIVE mg/dL
Specific Gravity, Urine: 1.015 (ref 1.005–1.030)
pH: 6 (ref 5.0–8.0)

## 2019-08-31 LAB — COMPREHENSIVE METABOLIC PANEL
ALT: 19 U/L (ref 0–44)
AST: 24 U/L (ref 15–41)
Albumin: 3.8 g/dL (ref 3.5–5.0)
Alkaline Phosphatase: 35 U/L — ABNORMAL LOW (ref 38–126)
Anion gap: 10 (ref 5–15)
BUN: 13 mg/dL (ref 8–23)
CO2: 25 mmol/L (ref 22–32)
Calcium: 9.3 mg/dL (ref 8.9–10.3)
Chloride: 104 mmol/L (ref 98–111)
Creatinine, Ser: 1.08 mg/dL (ref 0.61–1.24)
GFR calc Af Amer: >60 mL/min (ref >60)
GFR calc non Af Amer: >60 mL/min (ref >60)
Glucose, Bld: 98 mg/dL (ref 70–99)
Potassium: 4 mmol/L (ref 3.5–5.1)
Sodium: 139 mmol/L (ref 135–145)
Total Bilirubin: 0.7 mg/dL (ref 0.3–1.2)
Total Protein: 5.9 g/dL — ABNORMAL LOW (ref 6.5–8.1)

## 2019-08-31 LAB — CBC
HCT: 37.7 % — ABNORMAL LOW (ref 39.0–52.0)
Hemoglobin: 12.9 g/dL — ABNORMAL LOW (ref 13.0–17.0)
MCH: 33.3 pg (ref 26.0–34.0)
MCHC: 34.2 g/dL (ref 30.0–36.0)
MCV: 97.4 fL (ref 80.0–100.0)
Platelets: 140 10*3/uL — ABNORMAL LOW (ref 150–400)
RBC: 3.87 MIL/uL — ABNORMAL LOW (ref 4.22–5.81)
RDW: 12.7 % (ref 11.5–15.5)
WBC: 4.6 10*3/uL (ref 4.0–10.5)
nRBC: 0 % (ref 0.0–0.2)

## 2019-08-31 LAB — POCT I-STAT 7, (LYTES, BLD GAS, ICA,H+H)
Acid-Base Excess: 1 mmol/L (ref 0.0–2.0)
Bicarbonate: 26.3 mmol/L (ref 20.0–28.0)
Calcium, Ion: 1.27 mmol/L (ref 1.15–1.40)
HCT: 39 % (ref 39.0–52.0)
Hemoglobin: 13.3 g/dL (ref 13.0–17.0)
O2 Saturation: 95 %
Patient temperature: 98
Potassium: 3.4 mmol/L — ABNORMAL LOW (ref 3.5–5.1)
Sodium: 141 mmol/L (ref 135–145)
TCO2: 28 mmol/L (ref 22–32)
pCO2 arterial: 42.1 mmHg (ref 32.0–48.0)
pH, Arterial: 7.402 (ref 7.350–7.450)
pO2, Arterial: 77 mmHg — ABNORMAL LOW (ref 83.0–108.0)

## 2019-08-31 LAB — PREPARE RBC (CROSSMATCH)

## 2019-08-31 LAB — PROTIME-INR
INR: 1.1 (ref 0.8–1.2)
Prothrombin Time: 13.7 seconds (ref 11.4–15.2)

## 2019-08-31 LAB — MRSA PCR SCREENING: MRSA by PCR: NEGATIVE

## 2019-08-31 LAB — HEMOGLOBIN A1C
Hgb A1c MFr Bld: 6.1 % — ABNORMAL HIGH (ref 4.8–5.6)
Mean Plasma Glucose: 128.37 mg/dL

## 2019-08-31 LAB — ABO/RH: ABO/RH(D): AB POS

## 2019-08-31 LAB — PLATELET INHIBITION P2Y12: Platelet Function  P2Y12: 215 [PRU] (ref 182–335)

## 2019-08-31 LAB — APTT: aPTT: 28 seconds (ref 24–36)

## 2019-08-31 SURGERY — LEFT HEART CATH AND CORONARY ANGIOGRAPHY
Anesthesia: LOCAL

## 2019-08-31 MED ORDER — DOPAMINE-DEXTROSE 3.2-5 MG/ML-% IV SOLN
0.0000 ug/kg/min | INTRAVENOUS | Status: DC
  Filled 2019-08-31: qty 250

## 2019-08-31 MED ORDER — SODIUM CHLORIDE 0.9 % IV SOLN
INTRAVENOUS | Status: DC
  Administered 2019-08-31: 15:00:00 via INTRAVENOUS

## 2019-08-31 MED ORDER — VERAPAMIL HCL 2.5 MG/ML IV SOLN
INTRAVENOUS | Status: DC | PRN
  Administered 2019-08-31: 10 mL via INTRA_ARTERIAL

## 2019-08-31 MED ORDER — ACETAMINOPHEN 325 MG PO TABS: 650.0000 mg | ORAL_TABLET | ORAL | Status: DC | PRN

## 2019-08-31 MED ORDER — TRANEXAMIC ACID (OHS) BOLUS VIA INFUSION
15.0000 mg/kg | INTRAVENOUS | Status: AC
  Administered 2019-09-01: 1456.5 mg via INTRAVENOUS
  Filled 2019-08-31: qty 1457

## 2019-08-31 MED ORDER — INSULIN REGULAR(HUMAN) IN NACL 100-0.9 UT/100ML-% IV SOLN
INTRAVENOUS | Status: AC
  Administered 2019-09-01: 1 [IU]/h via INTRAVENOUS
  Filled 2019-08-31: qty 100

## 2019-08-31 MED ORDER — NITROGLYCERIN IN D5W 200-5 MCG/ML-% IV SOLN
2.0000 ug/min | INTRAVENOUS | Status: DC
  Filled 2019-08-31 (×2): qty 250

## 2019-08-31 MED ORDER — CHLORHEXIDINE GLUCONATE CLOTH 2 % EX PADS
6.0000 | MEDICATED_PAD | Freq: Every day | CUTANEOUS | Status: DC
  Administered 2019-08-31: 13:00:00 6 via TOPICAL

## 2019-08-31 MED ORDER — SALINE SPRAY 0.65 % NA SOLN
1.0000 | NASAL | Status: DC | PRN
  Filled 2019-08-31: qty 44

## 2019-08-31 MED ORDER — HEPARIN (PORCINE) 25000 UT/250ML-% IV SOLN: 1000.0000 [IU]/h | INTRAVENOUS | Status: DC

## 2019-08-31 MED ORDER — HEPARIN SODIUM (PORCINE) 1000 UNIT/ML IJ SOLN
INTRAMUSCULAR | Status: DC | PRN
  Administered 2019-08-31: 5000 [IU] via INTRAVENOUS

## 2019-08-31 MED ORDER — ONDANSETRON HCL 4 MG/2ML IJ SOLN: 4.0000 mg | Freq: Four times a day (QID) | INTRAMUSCULAR | Status: DC | PRN

## 2019-08-31 MED ORDER — VERAPAMIL HCL 2.5 MG/ML IV SOLN
INTRAVENOUS | Status: AC
  Filled 2019-08-31: qty 2

## 2019-08-31 MED ORDER — VANCOMYCIN HCL 10 G IV SOLR
1500.0000 mg | INTRAVENOUS | Status: AC
  Administered 2019-09-01: 1500 mg via INTRAVENOUS
  Filled 2019-08-31: qty 1500

## 2019-08-31 MED ORDER — ASPIRIN 81 MG PO CHEW: 81.0000 mg | CHEWABLE_TABLET | Freq: Every day | ORAL | Status: DC

## 2019-08-31 MED ORDER — MILRINONE LACTATE IN DEXTROSE 20-5 MG/100ML-% IV SOLN
0.3000 ug/kg/min | INTRAVENOUS | Status: DC
  Filled 2019-08-31: qty 100

## 2019-08-31 MED ORDER — TRANEXAMIC ACID 1000 MG/10ML IV SOLN
1.5000 mg/kg/h | INTRAVENOUS | Status: AC
  Administered 2019-09-01: 09:00:00 1.5 mg/kg/h via INTRAVENOUS
  Filled 2019-08-31: qty 25

## 2019-08-31 MED ORDER — SODIUM CHLORIDE 0.9 % IV SOLN
750.0000 mg | INTRAVENOUS | Status: DC
  Filled 2019-08-31: qty 750

## 2019-08-31 MED ORDER — TEMAZEPAM 15 MG PO CAPS
15.0000 mg | ORAL_CAPSULE | Freq: Once | ORAL | Status: AC | PRN
  Administered 2019-08-31: 15 mg via ORAL
  Filled 2019-08-31: qty 1

## 2019-08-31 MED ORDER — HEPARIN (PORCINE) IN NACL 1000-0.9 UT/500ML-% IV SOLN
INTRAVENOUS | Status: DC | PRN
  Administered 2019-08-31 (×2): 500 mL

## 2019-08-31 MED ORDER — OXYMETAZOLINE HCL 0.05 % NA SOLN
1.0000 | Freq: Two times a day (BID) | NASAL | Status: DC
  Administered 2019-08-31: 22:00:00 1 via NASAL
  Filled 2019-08-31: qty 30

## 2019-08-31 MED ORDER — PLASMA-LYTE 148 IV SOLN
INTRAVENOUS | Status: AC
  Administered 2019-09-01: 500 mL
  Filled 2019-08-31: qty 2.5

## 2019-08-31 MED ORDER — EZETIMIBE 10 MG PO TABS
10.0000 mg | ORAL_TABLET | Freq: Every day | ORAL | Status: DC
  Administered 2019-08-31 – 2019-09-07 (×7): 10 mg via ORAL
  Filled 2019-08-31 (×8): qty 1

## 2019-08-31 MED ORDER — LIDOCAINE HCL (PF) 1 % IJ SOLN
INTRAMUSCULAR | Status: DC | PRN
  Administered 2019-08-31: 2 mL via INTRADERMAL

## 2019-08-31 MED ORDER — TRANEXAMIC ACID (OHS) PUMP PRIME SOLUTION
2.0000 mg/kg | INTRAVENOUS | Status: DC
  Filled 2019-08-31: qty 1.94

## 2019-08-31 MED ORDER — CHLORHEXIDINE GLUCONATE CLOTH 2 % EX PADS
6.0000 | MEDICATED_PAD | Freq: Once | CUTANEOUS | Status: AC
  Administered 2019-08-31: 23:00:00 6 via TOPICAL

## 2019-08-31 MED ORDER — HEPARIN (PORCINE) 25000 UT/250ML-% IV SOLN
1000.0000 [IU]/h | INTRAVENOUS | Status: DC
  Administered 2019-08-31: 1000 [IU]/h via INTRAVENOUS
  Filled 2019-08-31: qty 250

## 2019-08-31 MED ORDER — SODIUM CHLORIDE 0.9% FLUSH: 3.0000 mL | INTRAVENOUS | Status: DC | PRN

## 2019-08-31 MED ORDER — IOHEXOL 350 MG/ML SOLN
INTRAVENOUS | Status: DC | PRN
  Administered 2019-08-31: 30 mL

## 2019-08-31 MED ORDER — SODIUM CHLORIDE 0.9 % IV SOLN: 250.0000 mL | INTRAVENOUS | Status: DC | PRN

## 2019-08-31 MED ORDER — DIAZEPAM 5 MG PO TABS
5.0000 mg | ORAL_TABLET | ORAL | Status: DC | PRN
  Administered 2019-08-31: 19:00:00 5 mg via ORAL
  Filled 2019-08-31: qty 1

## 2019-08-31 MED ORDER — EPINEPHRINE HCL 5 MG/250ML IV SOLN IN NS
0.0000 ug/min | INTRAVENOUS | Status: DC
  Filled 2019-08-31: qty 250

## 2019-08-31 MED ORDER — LORATADINE 10 MG PO TABS
10.0000 mg | ORAL_TABLET | Freq: Every day | ORAL | Status: DC
  Administered 2019-08-31: 17:00:00 10 mg via ORAL
  Filled 2019-08-31: qty 1

## 2019-08-31 MED ORDER — HEPARIN SODIUM (PORCINE) 1000 UNIT/ML IJ SOLN
INTRAMUSCULAR | Status: AC
  Filled 2019-08-31: qty 1

## 2019-08-31 MED ORDER — ROSUVASTATIN CALCIUM 20 MG PO TABS
40.0000 mg | ORAL_TABLET | Freq: Every evening | ORAL | Status: DC
  Administered 2019-08-31 – 2019-09-06 (×6): 40 mg via ORAL
  Filled 2019-08-31 (×6): qty 2

## 2019-08-31 MED ORDER — SODIUM CHLORIDE 0.9 % IV SOLN
1.5000 g | INTRAVENOUS | Status: AC
  Administered 2019-09-01: 12:00:00 .75 g via INTRAVENOUS
  Administered 2019-09-01: 08:00:00 1.5 g via INTRAVENOUS
  Filled 2019-08-31: qty 1.5

## 2019-08-31 MED ORDER — MIDAZOLAM HCL 2 MG/2ML IJ SOLN
INTRAMUSCULAR | Status: AC
  Filled 2019-08-31: qty 2

## 2019-08-31 MED ORDER — BISACODYL 5 MG PO TBEC
5.0000 mg | DELAYED_RELEASE_TABLET | Freq: Once | ORAL | Status: AC
  Administered 2019-08-31: 13:00:00 5 mg via ORAL
  Filled 2019-08-31: qty 1

## 2019-08-31 MED ORDER — MIDAZOLAM HCL 2 MG/2ML IJ SOLN
INTRAMUSCULAR | Status: DC | PRN
  Administered 2019-08-31: 2 mg via INTRAVENOUS

## 2019-08-31 MED ORDER — SODIUM CHLORIDE 0.9 % WEIGHT BASED INFUSION
3.0000 mL/kg/h | INTRAVENOUS | Status: DC
  Administered 2019-08-31: 07:00:00 3 mL/kg/h via INTRAVENOUS

## 2019-08-31 MED ORDER — SODIUM CHLORIDE 0.9 % IV SOLN
INTRAVENOUS | Status: DC
  Filled 2019-08-31: qty 30

## 2019-08-31 MED ORDER — PHENYLEPHRINE HCL-NACL 20-0.9 MG/250ML-% IV SOLN
30.0000 ug/min | INTRAVENOUS | Status: AC
  Administered 2019-09-01: 15 ug/min via INTRAVENOUS
  Filled 2019-08-31: qty 250

## 2019-08-31 MED ORDER — NIACIN ER (ANTIHYPERLIPIDEMIC) 500 MG PO TBCR
1000.0000 mg | EXTENDED_RELEASE_TABLET | Freq: Every day | ORAL | Status: DC
  Administered 2019-08-31: 22:00:00 1000 mg via ORAL
  Filled 2019-08-31: qty 1

## 2019-08-31 MED ORDER — METOPROLOL TARTRATE 12.5 MG HALF TABLET
12.5000 mg | ORAL_TABLET | Freq: Once | ORAL | Status: DC
  Filled 2019-08-31: qty 1

## 2019-08-31 MED ORDER — CHLORHEXIDINE GLUCONATE 0.12 % MT SOLN
15.0000 mL | Freq: Once | OROMUCOSAL | Status: AC
  Administered 2019-09-01: 06:00:00 15 mL via OROMUCOSAL
  Filled 2019-08-31: qty 15

## 2019-08-31 MED ORDER — DEXMEDETOMIDINE HCL IN NACL 400 MCG/100ML IV SOLN
0.1000 ug/kg/h | INTRAVENOUS | Status: AC
  Administered 2019-09-01: .3 ug/kg/h via INTRAVENOUS
  Filled 2019-08-31: qty 100

## 2019-08-31 MED ORDER — LABETALOL HCL 5 MG/ML IV SOLN: 10.0000 mg | INTRAVENOUS | Status: AC | PRN

## 2019-08-31 MED ORDER — MANNITOL 20 % IV SOLN: Freq: Once | INTRAVENOUS | Status: DC

## 2019-08-31 MED ORDER — POTASSIUM CHLORIDE 2 MEQ/ML IV SOLN
80.0000 meq | INTRAVENOUS | Status: DC
  Filled 2019-08-31: qty 40

## 2019-08-31 MED ORDER — HEPARIN (PORCINE) IN NACL 1000-0.9 UT/500ML-% IV SOLN
INTRAVENOUS | Status: AC
  Filled 2019-08-31: qty 1000

## 2019-08-31 MED ORDER — CHLORHEXIDINE GLUCONATE CLOTH 2 % EX PADS
6.0000 | MEDICATED_PAD | Freq: Once | CUTANEOUS | Status: AC
  Administered 2019-08-31: 13:00:00 6 via TOPICAL

## 2019-08-31 MED ORDER — FENTANYL CITRATE (PF) 100 MCG/2ML IJ SOLN
INTRAMUSCULAR | Status: DC | PRN
  Administered 2019-08-31: 25 ug via INTRAVENOUS

## 2019-08-31 MED ORDER — SODIUM CHLORIDE 0.9% FLUSH: 3.0000 mL | Freq: Two times a day (BID) | INTRAVENOUS | Status: DC

## 2019-08-31 MED ORDER — FENTANYL CITRATE (PF) 100 MCG/2ML IJ SOLN
INTRAMUSCULAR | Status: AC
  Filled 2019-08-31: qty 2

## 2019-08-31 MED ORDER — LIDOCAINE HCL (PF) 1 % IJ SOLN
INTRAMUSCULAR | Status: AC
  Filled 2019-08-31: qty 30

## 2019-08-31 MED ORDER — ASPIRIN 81 MG PO CHEW
81.0000 mg | CHEWABLE_TABLET | ORAL | Status: AC
  Administered 2019-08-31: 07:00:00 81 mg via ORAL
  Filled 2019-08-31: qty 1

## 2019-08-31 MED ORDER — HYDRALAZINE HCL 20 MG/ML IJ SOLN: 10.0000 mg | INTRAMUSCULAR | Status: AC | PRN

## 2019-08-31 MED ORDER — SODIUM CHLORIDE 0.9 % WEIGHT BASED INFUSION: 1.0000 mL/kg/h | INTRAVENOUS | Status: DC

## 2019-08-31 MED ORDER — SODIUM CHLORIDE 0.9% FLUSH
3.0000 mL | Freq: Two times a day (BID) | INTRAVENOUS | Status: DC
  Administered 2019-08-31 (×2): 3 mL via INTRAVENOUS

## 2019-08-31 SURGICAL SUPPLY — 11 items
CATH OPTITORQUE TIG 4.0 5F (CATHETERS) ×2 IMPLANT
DEVICE RAD COMP TR BAND LRG (VASCULAR PRODUCTS) ×2 IMPLANT
GLIDESHEATH SLEND SS 6F .021 (SHEATH) ×2 IMPLANT
GUIDEWIRE INQWIRE 1.5J.035X260 (WIRE) ×1 IMPLANT
INQWIRE 1.5J .035X260CM (WIRE) ×2
KIT HEART LEFT (KITS) ×2 IMPLANT
PACK CARDIAC CATHETERIZATION (CUSTOM PROCEDURE TRAY) ×2 IMPLANT
SHEATH PROBE COVER 6X72 (BAG) ×2 IMPLANT
TRANSDUCER W/STOPCOCK (MISCELLANEOUS) ×2 IMPLANT
TUBING CIL FLEX 10 FLL-RA (TUBING) ×2 IMPLANT
WIRE HI TORQ VERSACORE-J 145CM (WIRE) ×2 IMPLANT

## 2019-08-31 NOTE — Progress Notes (Signed)
ANTICOAGULATION CONSULT NOTE - Initial Consult  Pharmacy Consult for Heparin Indication: chest pain/ACS  No Known Allergies  Patient Measurements: Height: 5\' 8"  (172.7 cm) Weight: 214 lb (97.1 kg) IBW/kg (Calculated) : 68.4   Vital Signs: Temp: 97.7 F (36.5 C) (10/22 1143) Temp Source: Oral (10/22 1143) BP: 114/68 (10/22 1300) Pulse Rate: 51 (10/22 0915)  Labs: Recent Labs    08/31/19 1255  HGB 13.3  HCT 39.0    Estimated Creatinine Clearance: 58.6 mL/min (by C-G formula based on SCr of 1.25 mg/dL).   Medical History: Past Medical History:  Diagnosis Date  . Coronary artery disease   . Gout    Of the left third, fourth, and fifth MTPareas with swelling of left foot and pain  . Hyperlipidemia   . Hypertension       Assessment: 73yom admitted s/p cath with multi-vessel CAD.  Plan for CABG 10/23  Heparin drip to start 8hr after sheath removed.  Pulled at 0830 begin at 1630   Goal of Therapy:  Heparin level 0.3-0.7 units/ml Monitor platelets by anticoagulation protocol: Yes   Plan:  Begin heparin drip 1000 uts/hr  - begin at 4:30pm - 8hr after sheath pulled Turn heparin off on way to surgery  Follow up post op   Bonnita Nasuti Pharm.D. CPP, BCPS Clinical Pharmacist [phone] 08/31/2019 1:33 PM

## 2019-08-31 NOTE — Interval H&P Note (Signed)
Cath Lab Visit (complete for each Cath Lab visit)  Clinical Evaluation Leading to the Procedure:   ACS: No.  Non-ACS:    Anginal Classification: CCS III  Anti-ischemic medical therapy: Minimal Therapy (1 class of medications)  Non-Invasive Test Results: High-risk stress test findings: cardiac mortality >3%/year  Prior CABG: No previous CABG      History and Physical Interval Note:  08/31/2019 7:26 AM  [name] [name]  has presented today for surgery, with the diagnosis of Abnormal nuclear stress test.  The various methods of treatment have been discussed with the patient and family. After consideration of risks, benefits and other options for treatment, the patient has consented to  Procedure(s): LEFT HEART CATH AND CORONARY ANGIOGRAPHY (N/A) as a surgical intervention.  The patient's history has been reviewed, patient examined, no change in status, stable for surgery.  I have reviewed the patient's chart and labs.  Questions were answered to the patient's satisfaction.     Shelva Majestic

## 2019-08-31 NOTE — Anesthesia Preprocedure Evaluation (Addendum)
Anesthesia Evaluation  Patient identified by MRN, date of birth, ID band Patient awake    Reviewed: Allergy & Precautions, NPO status , Patient's Chart, lab work & pertinent test results, reviewed documented beta blocker date and time   Airway Mallampati: II  TM Distance: >3 FB Neck ROM: Full    Dental  (+) Teeth Intact, Dental Advisory Given,  Upper permanent bridge :   Pulmonary neg pulmonary ROS,    Pulmonary exam normal breath sounds clear to auscultation       Cardiovascular hypertension, Pt. on medications and Pt. on home beta blockers + angina + CAD  Normal cardiovascular exam Rhythm:Regular Rate:Normal  Echo 07/11/2019:  1. The left ventricle has normal systolic function with an ejection fraction of 60-65%. The cavity size was normal. Left ventricular diastolic Doppler parameters are consistent with impaired relaxation.  2. The right ventricle has normal systolic function. The cavity was normal. There is no increase in right ventricular wall thickness.  3. Left atrial size was mildly dilated.  4. The aortic valve is grossly normal. No stenosis of the aortic valve.  5. The aorta is abnormal unless otherwise noted.  6. There is moderate dilatation of the ascending aorta.  7. The atrial septum is grossly normal.   Neuro/Psych negative neurological ROS  negative psych ROS   GI/Hepatic negative GI ROS, Neg liver ROS,   Endo/Other  Obesity   Renal/GU negative Renal ROS     Musculoskeletal negative musculoskeletal ROS (+)   Abdominal   Peds  Hematology  (+) Blood dyscrasia (Thrombocytopenia--Plt 140k; Plavix), anemia ,   Anesthesia Other Findings Day of surgery medications reviewed with the patient.  Reproductive/Obstetrics                           Anesthesia Physical Anesthesia Plan  ASA: IV  Anesthesia Plan: General   Post-op Pain Management:    Induction: Intravenous  PONV Risk  Score and Plan: 2 and Treatment may vary due to age or medical condition and Midazolam  Airway Management Planned: Oral ETT  Additional Equipment: Arterial line, CVP, PA Cath, Ultrasound Guidance Line Placement and TEE  Intra-op Plan:   Post-operative Plan: Post-operative intubation/ventilation  Informed Consent: I have reviewed the patients History and Physical, chart, labs and discussed the procedure including the risks, benefits and alternatives for the proposed anesthesia with the patient or authorized representative who has indicated his/her understanding and acceptance.     Dental advisory given  Plan Discussed with: CRNA  Anesthesia Plan Comments:        Anesthesia Quick Evaluation

## 2019-08-31 NOTE — Progress Notes (Signed)
Pre-CABG testing has been completed. Preliminary results can be found in CV Proc through chart review.   08/31/19 1:18 PM [name] [name] RVT

## 2019-08-31 NOTE — Consult Note (Signed)
[address]       [place] [address]             [phone]        [name] [name] [place] Medical Record [id number] Date of Birth: 05/18/1946  Referring: No ref. provider found Primary Care: Troy Sine, MD Primary Cardiologist:No primary care provider on file.  Chief Complaint:   No chief complaint on file.   History of Present Illness:     73 year old male admitted to the hospital following a left heart catheterization.  He has a history of familial hyperlipidemia, coronary artery disease has been managed medically for the last 30 years.  Last months progressive exertional dyspnea and mild exertional chest pain.  His left heart cath showed severe left main and three-vessel coronary artery disease and CTS consulted to assist with management.   Past Medical and Surgical History: Previous Chest Surgery: no Previous Chest Radiation: no Diabetes Mellitus: no.  HbA1C pending Creatinine: 1.25  Past Medical History:  Diagnosis Date  . Coronary artery disease   . Gout    Of the left third, fourth, and fifth MTPareas with swelling of left foot and pain  . Hyperlipidemia   . Hypertension     Past Surgical History:  Procedure Laterality Date  . CARDIAC CATHETERIZATION  01/04/1986    Social History: Support: good family support  Social History   Tobacco Use  Smoking Status Never Smoker  Smokeless Tobacco Never Used    Social History   Substance and Sexual Activity  Alcohol Use Yes  . Alcohol/week: 5.0 standard drinks  . Types: 5 drink(s) per week   Comment: Scothc or wine     No Known Allergies  Medications: Asprin: yes Statin: yes Beta Blocker: yes Ace Inhibitor: no Anti-Coagulation: heparin gtt  Current Facility-Administered Medications  Medication Dose Route Frequency Provider Last Rate Last Dose  . 0.9 %  sodium chloride infusion   Intravenous Continuous Troy Sine, MD 100 mL/hr at 08/31/19 0843 100 mL/hr at  08/31/19 0843  . 0.9 %  sodium chloride infusion  250 mL Intravenous PRN Troy Sine, MD      . acetaminophen (TYLENOL) tablet 650 mg  650 mg Oral Q4H PRN Troy Sine, MD      . aspirin chewable tablet 81 mg  81 mg Oral Daily Troy Sine, MD      . bisacodyl (DULCOLAX) EC tablet 5 mg  5 mg Oral Once Lajuana Matte, MD      . Derrill Memo ON 09/01/2019] chlorhexidine (PERIDEX) 0.12 % solution 15 mL  15 mL Mouth/Throat Once Manning Luna, Lucile Crater, MD      . Chlorhexidine Gluconate Cloth 2 % PADS 6 each  6 each Topical Once Lajuana Matte, MD       And  . Chlorhexidine Gluconate Cloth 2 % PADS 6 each  6 each Topical Once Arpita Fentress O, MD      . Chlorhexidine Gluconate Cloth 2 % PADS 6 each  6 each Topical Daily Izek Corvino O, MD      . diazepam (VALIUM) tablet 5 mg  5 mg Oral Q4H PRN Troy Sine, MD      . ezetimibe (ZETIA) tablet 10 mg  10 mg Oral Daily Shelva Majestic A, MD      . hydrALAZINE (APRESOLINE) injection 10 mg  10 mg Intravenous Q20 Min PRN Troy Sine, MD      . labetalol (  NORMODYNE) injection 10 mg  10 mg Intravenous Q10 min PRN Troy Sine, MD      . Derrill Memo ON 09/01/2019] metoprolol tartrate (LOPRESSOR) tablet 12.5 mg  12.5 mg Oral Once Lajuana Matte, MD      . niacin (NIASPAN) CR tablet 1,000 mg  1,000 mg Oral QHS Troy Sine, MD      . ondansetron [facility]) injection 4 mg  4 mg Intravenous Q6H PRN Troy Sine, MD      . rosuvastatin (CRESTOR) tablet 40 mg  40 mg Oral QPM Troy Sine, MD      . sodium chloride flush (NS) 0.9 % injection 3 mL  3 mL Intravenous Q12H Shelva Majestic A, MD      . sodium chloride flush (NS) 0.9 % injection 3 mL  3 mL Intravenous PRN Troy Sine, MD      . temazepam (RESTORIL) capsule 15 mg  15 mg Oral Once PRN Lajuana Matte, MD        Medications Prior to Admission  Medication Sig Dispense Refill Last Dose  . Alirocumab (PRALUENT) 150 MG/ML SOAJ Inject 150 mg into the skin every 14  (fourteen) days. 2 pen 6 Past Week at Unknown time  . aspirin 325 MG tablet Take 81 mg by mouth every evening.    08/30/2019 at Unknown time  . cetirizine (ZYRTEC) 10 MG tablet Take 10 mg by mouth every evening.    08/30/2019 at Unknown time  . Cholecalciferol (VITAMIN D3) 125 MCG (5000 UT) CAPS Take 5,000 Units by mouth every evening.    08/31/2019 at 0500  . clopidogrel (PLAVIX) 75 MG tablet Take 75 mg by mouth every evening.    08/29/2019  . ezetimibe (ZETIA) 10 MG tablet TAKE 1 TABLET BY MOUTH  DAILY (Patient taking differently: Take 10 mg by mouth every evening. ) 90 tablet 3 08/30/2019 at Unknown time  . hydrochlorothiazide (MICROZIDE) 12.5 MG capsule Take 1 capsule (12.5 mg total) by mouth daily. (Patient taking differently: Take 12.5 mg by mouth every evening. ) 90 capsule 3 08/30/2019 at Unknown time  . metoprolol succinate (TOPROL-XL) 25 MG 24 hr tablet TAKE 1 TABLET BY MOUTH EVERY DAY (Patient taking differently: Take 25 mg by mouth every evening. ) 90 tablet 0 08/30/2019 at Unknown time  . NIASPAN 1000 MG CR tablet TAKE 1 TABLET BY MOUTH  EVERY NIGHT AT BEDTIME 90 tablet 1 Past Month at Unknown time  . Omega-3 Fatty Acids (FISH OIL PO) Take 1,000 mg by mouth.   Past Month at Unknown time  . Oxymetazoline HCl (NASAL SPRAY) 0.05 % SOLN Place 1 spray into the nose daily as needed (congestion).   08/30/2019 at Unknown time  . rosuvastatin (CRESTOR) 40 MG tablet TAKE 1 TABLET BY MOUTH  DAILY (Patient taking differently: Take 40 mg by mouth every evening. ) 90 tablet 1 08/30/2019 at Unknown time  . telmisartan (MICARDIS) 80 MG tablet Take 40 mg by mouth every evening.    08/30/2019 at Unknown time  . nitroGLYCERIN (NITROSTAT) 0.4 MG SL tablet TAKE 1 TABLET BY MOUTH UNDER THE TONGUE EVERY 5 MINUTES AS NEEDED FOR CHEST PAIN (Patient taking differently: Place 0.4 mg under the tongue every 5 (five) minutes as needed for chest pain. ) 25 tablet 0 Unknown at Unknown time    Family History  Problem  Relation Age of Onset  . Heart attack Father   . Hyperlipidemia Father   . Heart disease Brother   .  Hyperlipidemia Brother      Review of Systems:   Review of Systems  Constitutional: Negative.   HENT: Negative.   Eyes: Negative.   Respiratory: Positive for shortness of breath.   Cardiovascular: Positive for chest pain.  Gastrointestinal: Negative.   Musculoskeletal: Negative.   Skin: Negative.   Neurological: Negative.       Physical Exam: BP 136/80   Pulse (!) 51   Temp (!) 97.3 F (36.3 C) (Skin)   Resp 20   Ht 5\' 8"  (1.727 m)   Wt 97.1 kg   SpO2 100%   BMI 32.54 kg/m  Physical Exam  Constitutional: He is oriented to person, place, and time. No distress.  HENT:  Head: Normocephalic and atraumatic.  Eyes: Conjunctivae and EOM are normal.  Neck: Normal range of motion. Neck supple. No tracheal deviation present.  Cardiovascular: Normal rate and normal heart sounds.  No murmur heard. Pulmonary/Chest: Effort normal and breath sounds normal. No respiratory distress.  Abdominal: Soft. He exhibits no distension. There is no abdominal tenderness.  Musculoskeletal: Normal range of motion.        General: No edema.  Neurological: He is alert and oriented to person, place, and time.  Skin: Skin is warm and dry. He is not diaphoretic.      Diagnostic Studies & Laboratory data:    Left Heart Catherization: Severe left main disease, large circumflex vessel which sends collaterals to the anterior wall and posterior wall.  There is small filling of the LAD and diagonal branch.  Large collateral thinks that the leads to a  large right-sided system. Echo: No significant valvular disease, preserved left heart and right heart function.   I have independently reviewed the above radiologic studies and discussed with the patient   Recent Lab Findings: Lab Results  Component Value Date   WBC 5.6 08/28/2019   HGB 13.4 08/28/2019   HCT 41.0 08/28/2019   PLT 167 08/28/2019    GLUCOSE 91 08/28/2019   CHOL 127 04/14/2019   TRIG 86 04/14/2019   HDL 75 04/14/2019   LDLCALC 35 04/14/2019   ALT 20 04/14/2019   AST 29 04/14/2019   NA 138 08/28/2019   K 4.3 08/28/2019   CL 100 08/28/2019   CREATININE 1.25 08/28/2019   BUN 16 08/28/2019   CO2 25 08/28/2019   TSH 2.840 12/06/2014      Assessment / Plan:   73 year old male with severe left main, three-vessel coronary disease who was admitted following an elective left heart catheter.  He is tentatively scheduled for tomorrow for coronary artery bypass grafting x4.  I am unsure the quality of the left anterior descending artery but will open this up for further assessment.  If it is of poor quality and I will place a left internal thoracic artery lateral wall target, since it is the best quality vessel seen on left heart cath.  With that in place vein grafts to the LAD, diagonal, and distal RCA.     I  spent 40 minutes counseling the patient face to face.   Lajuana Matte 08/31/2019 11:32 AM

## 2019-09-01 ENCOUNTER — Inpatient Hospital Stay (HOSPITAL_COMMUNITY): Payer: 59 | Admitting: Certified Registered Nurse Anesthetist

## 2019-09-01 ENCOUNTER — Inpatient Hospital Stay (HOSPITAL_COMMUNITY): Payer: 59

## 2019-09-01 ENCOUNTER — Inpatient Hospital Stay (HOSPITAL_COMMUNITY)
Admission: RE | Disposition: A | Discharge status: Home / Self Care | Payer: Self-pay | Source: Home / Self Care | Attending: Thoracic Surgery (Cardiothoracic Vascular Surgery)

## 2019-09-01 DIAGNOSIS — Z951 Presence of aortocoronary bypass graft: Secondary | ICD-10-CM

## 2019-09-01 DIAGNOSIS — I200 Unstable angina: Secondary | ICD-10-CM

## 2019-09-01 DIAGNOSIS — I25110 Atherosclerotic heart disease of native coronary artery with unstable angina pectoris: Secondary | ICD-10-CM

## 2019-09-01 HISTORY — PX: CORONARY ARTERY BYPASS GRAFT: SHX141

## 2019-09-01 HISTORY — PX: TEE WITHOUT CARDIOVERSION: SHX5443

## 2019-09-01 LAB — POCT I-STAT, CHEM 8
BUN: 10 mg/dL (ref 8–23)
BUN: 10 mg/dL (ref 8–23)
BUN: 8 mg/dL (ref 8–23)
BUN: 8 mg/dL (ref 8–23)
BUN: 7 mg/dL — ABNORMAL LOW (ref 8–23)
Calcium, Ion: 1.23 mmol/L (ref 1.15–1.40)
Calcium, Ion: 1.23 mmol/L (ref 1.15–1.40)
Calcium, Ion: 1.07 mmol/L — ABNORMAL LOW (ref 1.15–1.40)
Calcium, Ion: 1.05 mmol/L — ABNORMAL LOW (ref 1.15–1.40)
Calcium, Ion: 1.26 mmol/L (ref 1.15–1.40)
Chloride: 105 mmol/L (ref 98–111)
Chloride: 106 mmol/L (ref 98–111)
Chloride: 102 mmol/L (ref 98–111)
Chloride: 102 mmol/L (ref 98–111)
Chloride: 105 mmol/L (ref 98–111)
Creatinine, Ser: 0.7 mg/dL (ref 0.61–1.24)
Creatinine, Ser: 0.7 mg/dL (ref 0.61–1.24)
Creatinine, Ser: 0.6 mg/dL — ABNORMAL LOW (ref 0.61–1.24)
Creatinine, Ser: 0.6 mg/dL — ABNORMAL LOW (ref 0.61–1.24)
Creatinine, Ser: 0.6 mg/dL — ABNORMAL LOW (ref 0.61–1.24)
Glucose, Bld: 100 mg/dL — ABNORMAL HIGH (ref 70–99)
Glucose, Bld: 112 mg/dL — ABNORMAL HIGH (ref 70–99)
Glucose, Bld: 120 mg/dL — ABNORMAL HIGH (ref 70–99)
Glucose, Bld: 120 mg/dL — ABNORMAL HIGH (ref 70–99)
Glucose, Bld: 124 mg/dL — ABNORMAL HIGH (ref 70–99)
HCT: 34 % — ABNORMAL LOW (ref 39.0–52.0)
HCT: 32 % — ABNORMAL LOW (ref 39.0–52.0)
HCT: 25 % — ABNORMAL LOW (ref 39.0–52.0)
HCT: 23 % — ABNORMAL LOW (ref 39.0–52.0)
HCT: 21 % — ABNORMAL LOW (ref 39.0–52.0)
Hemoglobin: 11.6 g/dL — ABNORMAL LOW (ref 13.0–17.0)
Hemoglobin: 10.9 g/dL — ABNORMAL LOW (ref 13.0–17.0)
Hemoglobin: 8.5 g/dL — ABNORMAL LOW (ref 13.0–17.0)
Hemoglobin: 7.8 g/dL — ABNORMAL LOW (ref 13.0–17.0)
Hemoglobin: 7.1 g/dL — ABNORMAL LOW (ref 13.0–17.0)
Potassium: 3.8 mmol/L (ref 3.5–5.1)
Potassium: 4 mmol/L (ref 3.5–5.1)
Potassium: 4 mmol/L (ref 3.5–5.1)
Potassium: 4.4 mmol/L (ref 3.5–5.1)
Potassium: 3.6 mmol/L (ref 3.5–5.1)
Sodium: 141 mmol/L (ref 135–145)
Sodium: 141 mmol/L (ref 135–145)
Sodium: 139 mmol/L (ref 135–145)
Sodium: 139 mmol/L (ref 135–145)
Sodium: 140 mmol/L (ref 135–145)
TCO2: 29 mmol/L (ref 22–32)
TCO2: 26 mmol/L (ref 22–32)
TCO2: 28 mmol/L (ref 22–32)
TCO2: 25 mmol/L (ref 22–32)
TCO2: 25 mmol/L (ref 22–32)

## 2019-09-01 LAB — POCT I-STAT 7, (LYTES, BLD GAS, ICA,H+H)
Acid-Base Excess: 3 mmol/L — ABNORMAL HIGH (ref 0.0–2.0)
Acid-base deficit: 1 mmol/L (ref 0.0–2.0)
Acid-base deficit: 6 mmol/L — ABNORMAL HIGH (ref 0.0–2.0)
Acid-base deficit: 6 mmol/L — ABNORMAL HIGH (ref 0.0–2.0)
Bicarbonate: 26.4 mmol/L (ref 20.0–28.0)
Bicarbonate: 23.7 mmol/L (ref 20.0–28.0)
Bicarbonate: 20.2 mmol/L (ref 20.0–28.0)
Bicarbonate: 19.4 mmol/L — ABNORMAL LOW (ref 20.0–28.0)
Calcium, Ion: 1.05 mmol/L — ABNORMAL LOW (ref 1.15–1.40)
Calcium, Ion: 1.17 mmol/L (ref 1.15–1.40)
Calcium, Ion: 1.19 mmol/L (ref 1.15–1.40)
Calcium, Ion: 1.18 mmol/L (ref 1.15–1.40)
HCT: 25 % — ABNORMAL LOW (ref 39.0–52.0)
HCT: 26 % — ABNORMAL LOW (ref 39.0–52.0)
HCT: 27 % — ABNORMAL LOW (ref 39.0–52.0)
HCT: 26 % — ABNORMAL LOW (ref 39.0–52.0)
Hemoglobin: 8.5 g/dL — ABNORMAL LOW (ref 13.0–17.0)
Hemoglobin: 8.8 g/dL — ABNORMAL LOW (ref 13.0–17.0)
Hemoglobin: 9.2 g/dL — ABNORMAL LOW (ref 13.0–17.0)
Hemoglobin: 8.8 g/dL — ABNORMAL LOW (ref 13.0–17.0)
O2 Saturation: 100 %
O2 Saturation: 99 %
O2 Saturation: 96 %
O2 Saturation: 97 %
Patient temperature: 35.4
Patient temperature: 36
Patient temperature: 36.4
Potassium: 3.7 mmol/L (ref 3.5–5.1)
Potassium: 3.7 mmol/L (ref 3.5–5.1)
Potassium: 4.3 mmol/L (ref 3.5–5.1)
Potassium: 4.5 mmol/L (ref 3.5–5.1)
Sodium: 140 mmol/L (ref 135–145)
Sodium: 142 mmol/L (ref 135–145)
Sodium: 142 mmol/L (ref 135–145)
Sodium: 141 mmol/L (ref 135–145)
TCO2: 28 mmol/L (ref 22–32)
TCO2: 25 mmol/L (ref 22–32)
TCO2: 21 mmol/L — ABNORMAL LOW (ref 22–32)
TCO2: 21 mmol/L — ABNORMAL LOW (ref 22–32)
pCO2 arterial: 36.1 mmHg (ref 32.0–48.0)
pCO2 arterial: 35.5 mmHg (ref 32.0–48.0)
pCO2 arterial: 39.4 mmHg (ref 32.0–48.0)
pCO2 arterial: 37.8 mmHg (ref 32.0–48.0)
pH, Arterial: 7.473 — ABNORMAL HIGH (ref 7.350–7.450)
pH, Arterial: 7.426 (ref 7.350–7.450)
pH, Arterial: 7.314 — ABNORMAL LOW (ref 7.350–7.450)
pH, Arterial: 7.314 — ABNORMAL LOW (ref 7.350–7.450)
pO2, Arterial: 325 mmHg — ABNORMAL HIGH (ref 83.0–108.0)
pO2, Arterial: 155 mmHg — ABNORMAL HIGH (ref 83.0–108.0)
pO2, Arterial: 86 mmHg (ref 83.0–108.0)
pO2, Arterial: 90 mmHg (ref 83.0–108.0)

## 2019-09-01 LAB — MAGNESIUM: Magnesium: 3.2 mg/dL — ABNORMAL HIGH (ref 1.7–2.4)

## 2019-09-01 LAB — ECHO INTRAOPERATIVE TEE
Height: 68 in
Weight: 3424 oz

## 2019-09-01 LAB — BASIC METABOLIC PANEL
Anion gap: 9 (ref 5–15)
Anion gap: 10 (ref 5–15)
BUN: 11 mg/dL (ref 8–23)
BUN: 9 mg/dL (ref 8–23)
CO2: 25 mmol/L (ref 22–32)
CO2: 19 mmol/L — ABNORMAL LOW (ref 22–32)
Calcium: 9 mg/dL (ref 8.9–10.3)
Calcium: 8 mg/dL — ABNORMAL LOW (ref 8.9–10.3)
Chloride: 107 mmol/L (ref 98–111)
Chloride: 110 mmol/L (ref 98–111)
Creatinine, Ser: 0.91 mg/dL (ref 0.61–1.24)
Creatinine, Ser: 1.01 mg/dL (ref 0.61–1.24)
GFR calc Af Amer: >60 mL/min (ref >60)
GFR calc Af Amer: >60 mL/min (ref >60)
GFR calc non Af Amer: >60 mL/min (ref >60)
GFR calc non Af Amer: >60 mL/min (ref >60)
Glucose, Bld: 95 mg/dL (ref 70–99)
Glucose, Bld: 130 mg/dL — ABNORMAL HIGH (ref 70–99)
Potassium: 4.2 mmol/L (ref 3.5–5.1)
Potassium: 4.3 mmol/L (ref 3.5–5.1)
Sodium: 141 mmol/L (ref 135–145)
Sodium: 139 mmol/L (ref 135–145)

## 2019-09-01 LAB — CBC
HCT: 34.9 % — ABNORMAL LOW (ref 39.0–52.0)
HCT: 26.2 % — ABNORMAL LOW (ref 39.0–52.0)
HCT: 27 % — ABNORMAL LOW (ref 39.0–52.0)
Hemoglobin: 12.3 g/dL — ABNORMAL LOW (ref 13.0–17.0)
Hemoglobin: 8.8 g/dL — ABNORMAL LOW (ref 13.0–17.0)
Hemoglobin: 8.8 g/dL — ABNORMAL LOW (ref 13.0–17.0)
MCH: 33.8 pg (ref 26.0–34.0)
MCH: 33.2 pg (ref 26.0–34.0)
MCH: 33.2 pg (ref 26.0–34.0)
MCHC: 35.2 g/dL (ref 30.0–36.0)
MCHC: 33.6 g/dL (ref 30.0–36.0)
MCHC: 32.6 g/dL (ref 30.0–36.0)
MCV: 95.9 fL (ref 80.0–100.0)
MCV: 98.9 fL (ref 80.0–100.0)
MCV: 101.9 fL — ABNORMAL HIGH (ref 80.0–100.0)
Platelets: 117 10*3/uL — ABNORMAL LOW (ref 150–400)
Platelets: 100 10*3/uL — ABNORMAL LOW (ref 150–400)
Platelets: 119 10*3/uL — ABNORMAL LOW (ref 150–400)
RBC: 3.64 MIL/uL — ABNORMAL LOW (ref 4.22–5.81)
RBC: 2.65 MIL/uL — ABNORMAL LOW (ref 4.22–5.81)
RBC: 2.65 MIL/uL — ABNORMAL LOW (ref 4.22–5.81)
RDW: 12.8 % (ref 11.5–15.5)
RDW: 12.8 % (ref 11.5–15.5)
RDW: 12.9 % (ref 11.5–15.5)
WBC: 5.2 10*3/uL (ref 4.0–10.5)
WBC: 9.9 10*3/uL (ref 4.0–10.5)
WBC: 11.9 10*3/uL — ABNORMAL HIGH (ref 4.0–10.5)
nRBC: 0 % (ref 0.0–0.2)
nRBC: 0 % (ref 0.0–0.2)
nRBC: 0 % (ref 0.0–0.2)

## 2019-09-01 LAB — GLUCOSE, CAPILLARY
Glucose-Capillary: 88 mg/dL (ref 70–99)
Glucose-Capillary: 80 mg/dL (ref 70–99)
Glucose-Capillary: 129 mg/dL — ABNORMAL HIGH (ref 70–99)
Glucose-Capillary: 147 mg/dL — ABNORMAL HIGH (ref 70–99)
Glucose-Capillary: 139 mg/dL — ABNORMAL HIGH (ref 70–99)
Glucose-Capillary: 130 mg/dL — ABNORMAL HIGH (ref 70–99)
Glucose-Capillary: 113 mg/dL — ABNORMAL HIGH (ref 70–99)
Glucose-Capillary: 131 mg/dL — ABNORMAL HIGH (ref 70–99)
Glucose-Capillary: 120 mg/dL — ABNORMAL HIGH (ref 70–99)
Glucose-Capillary: 100 mg/dL — ABNORMAL HIGH (ref 70–99)

## 2019-09-01 LAB — APTT: aPTT: 29 seconds (ref 24–36)

## 2019-09-01 LAB — PROTIME-INR
INR: 1.5 — ABNORMAL HIGH (ref 0.8–1.2)
Prothrombin Time: 17.8 seconds — ABNORMAL HIGH (ref 11.4–15.2)

## 2019-09-01 LAB — HEMOGLOBIN AND HEMATOCRIT, BLOOD
HCT: 23.5 % — ABNORMAL LOW (ref 39.0–52.0)
Hemoglobin: 7.7 g/dL — ABNORMAL LOW (ref 13.0–17.0)

## 2019-09-01 LAB — PLATELET COUNT: Platelets: 101 10*3/uL — ABNORMAL LOW (ref 150–400)

## 2019-09-01 LAB — PREPARE RBC (CROSSMATCH)

## 2019-09-01 SURGERY — CORONARY ARTERY BYPASS GRAFTING (CABG)
Anesthesia: General | Site: Chest

## 2019-09-01 MED ORDER — MORPHINE SULFATE (PF) 2 MG/ML IV SOLN
1.0000 mg | INTRAVENOUS | Status: DC | PRN
  Administered 2019-09-01 (×3): 2 mg via INTRAVENOUS
  Filled 2019-09-01 (×3): qty 1

## 2019-09-01 MED ORDER — PHENYLEPHRINE HCL-NACL 20-0.9 MG/250ML-% IV SOLN
0.0000 ug/min | INTRAVENOUS | Status: DC
  Administered 2019-09-01: 100 ug/min via INTRAVENOUS
  Administered 2019-09-02: 25 ug/min via INTRAVENOUS
  Administered 2019-09-02: 22:00:00 30 ug/min via INTRAVENOUS
  Administered 2019-09-02: 35 ug/min via INTRAVENOUS
  Administered 2019-09-03: 13:00:00 15 ug/min via INTRAVENOUS
  Filled 2019-09-01 (×6): qty 250

## 2019-09-01 MED ORDER — FENTANYL CITRATE (PF) 250 MCG/5ML IJ SOLN
INTRAMUSCULAR | Status: AC
  Filled 2019-09-01: qty 25

## 2019-09-01 MED ORDER — LACTATED RINGERS IV SOLN
INTRAVENOUS | Status: DC | PRN
  Administered 2019-09-01: 07:00:00 via INTRAVENOUS

## 2019-09-01 MED ORDER — SODIUM CHLORIDE 0.9% FLUSH
3.0000 mL | Freq: Two times a day (BID) | INTRAVENOUS | Status: DC
  Administered 2019-09-02 – 2019-09-04 (×5): 3 mL via INTRAVENOUS

## 2019-09-01 MED ORDER — SODIUM CHLORIDE (PF) 0.9 % IJ SOLN
OROMUCOSAL | Status: DC | PRN
  Administered 2019-09-01 (×3): 4 mL via TOPICAL

## 2019-09-01 MED ORDER — PANTOPRAZOLE SODIUM 40 MG PO TBEC
40.0000 mg | DELAYED_RELEASE_TABLET | Freq: Every day | ORAL | Status: DC
  Administered 2019-09-03 – 2019-09-07 (×5): 40 mg via ORAL
  Filled 2019-09-01 (×5): qty 1

## 2019-09-01 MED ORDER — OXYCODONE HCL 5 MG PO TABS
5.0000 mg | ORAL_TABLET | ORAL | Status: DC | PRN
  Administered 2019-09-02: 07:00:00 5 mg via ORAL
  Administered 2019-09-02: 22:00:00 10 mg via ORAL
  Administered 2019-09-03: 5 mg via ORAL
  Administered 2019-09-03: 09:00:00 10 mg via ORAL
  Administered 2019-09-04: 23:00:00 5 mg via ORAL
  Filled 2019-09-01 (×2): qty 2
  Filled 2019-09-01 (×3): qty 1

## 2019-09-01 MED ORDER — ASPIRIN EC 325 MG PO TBEC
325.0000 mg | DELAYED_RELEASE_TABLET | Freq: Every day | ORAL | Status: DC
  Administered 2019-09-02 – 2019-09-07 (×6): 325 mg via ORAL
  Filled 2019-09-01 (×6): qty 1

## 2019-09-01 MED ORDER — TRAMADOL HCL 50 MG PO TABS
50.0000 mg | ORAL_TABLET | ORAL | Status: DC | PRN
  Administered 2019-09-02: 50 mg via ORAL
  Filled 2019-09-01: qty 1

## 2019-09-01 MED ORDER — MIDAZOLAM HCL (PF) 10 MG/2ML IJ SOLN
INTRAMUSCULAR | Status: AC
  Filled 2019-09-01: qty 2

## 2019-09-01 MED ORDER — HEPARIN SODIUM (PORCINE) 1000 UNIT/ML IJ SOLN
INTRAMUSCULAR | Status: DC | PRN
  Administered 2019-09-01: 29000 [IU] via INTRAVENOUS

## 2019-09-01 MED ORDER — SODIUM CHLORIDE 0.9 % IV SOLN: INTRAVENOUS | Status: DC

## 2019-09-01 MED ORDER — LACTATED RINGERS IV SOLN
INTRAVENOUS | Status: DC
  Administered 2019-09-02: 13:00:00 via INTRAVENOUS

## 2019-09-01 MED ORDER — ROCURONIUM BROMIDE 10 MG/ML (PF) SYRINGE
PREFILLED_SYRINGE | INTRAVENOUS | Status: DC | PRN
  Administered 2019-09-01: 50 mg via INTRAVENOUS
  Administered 2019-09-01: 100 mg via INTRAVENOUS
  Administered 2019-09-01 (×2): 50 mg via INTRAVENOUS

## 2019-09-01 MED ORDER — ACETAMINOPHEN 500 MG PO TABS
1000.0000 mg | ORAL_TABLET | Freq: Four times a day (QID) | ORAL | Status: AC
  Administered 2019-09-02 – 2019-09-06 (×19): 1000 mg via ORAL
  Filled 2019-09-01 (×20): qty 2

## 2019-09-01 MED ORDER — SODIUM CHLORIDE 0.45 % IV SOLN
INTRAVENOUS | Status: DC | PRN
  Administered 2019-09-01: 13:00:00 via INTRAVENOUS

## 2019-09-01 MED ORDER — 0.9 % SODIUM CHLORIDE (POUR BTL) OPTIME
TOPICAL | Status: DC | PRN
  Administered 2019-09-01: 6000 mL

## 2019-09-01 MED ORDER — DEXMEDETOMIDINE HCL IN NACL 400 MCG/100ML IV SOLN
0.0000 ug/kg/h | INTRAVENOUS | Status: DC
  Administered 2019-09-01: 14:00:00 0.6 ug/kg/h via INTRAVENOUS
  Filled 2019-09-01: qty 100

## 2019-09-01 MED ORDER — MIDAZOLAM HCL 5 MG/5ML IJ SOLN
INTRAMUSCULAR | Status: DC | PRN
  Administered 2019-09-01: 2 mg via INTRAVENOUS
  Administered 2019-09-01: 1 mg via INTRAVENOUS
  Administered 2019-09-01 (×2): 2 mg via INTRAVENOUS

## 2019-09-01 MED ORDER — PROTAMINE SULFATE 10 MG/ML IV SOLN
INTRAVENOUS | Status: AC
  Filled 2019-09-01: qty 25

## 2019-09-01 MED ORDER — CHLORHEXIDINE GLUCONATE 0.12% ORAL RINSE (MEDLINE KIT): 15.0000 mL | Freq: Two times a day (BID) | OROMUCOSAL | Status: DC

## 2019-09-01 MED ORDER — LACTATED RINGERS IV SOLN: INTRAVENOUS | Status: DC

## 2019-09-01 MED ORDER — SODIUM CHLORIDE 0.9 % IV SOLN: 250.0000 mL | INTRAVENOUS | Status: DC

## 2019-09-01 MED ORDER — SODIUM CHLORIDE 0.9% FLUSH: 10.0000 mL | INTRAVENOUS | Status: DC | PRN

## 2019-09-01 MED ORDER — ACETAMINOPHEN 650 MG RE SUPP: 650.0000 mg | Freq: Once | RECTAL | Status: AC

## 2019-09-01 MED ORDER — SODIUM CHLORIDE 0.9 % IV SOLN
1.5000 g | Freq: Two times a day (BID) | INTRAVENOUS | Status: AC
  Administered 2019-09-01 – 2019-09-03 (×4): 1.5 g via INTRAVENOUS
  Filled 2019-09-01 (×4): qty 1.5

## 2019-09-01 MED ORDER — HEPARIN SODIUM (PORCINE) 1000 UNIT/ML IJ SOLN
INTRAMUSCULAR | Status: AC
  Filled 2019-09-01: qty 1

## 2019-09-01 MED ORDER — ASPIRIN 81 MG PO CHEW: 324.0000 mg | CHEWABLE_TABLET | Freq: Every day | ORAL | Status: DC

## 2019-09-01 MED ORDER — ALBUMIN HUMAN 5 % IV SOLN
250.0000 mL | INTRAVENOUS | Status: AC | PRN
  Administered 2019-09-01 (×3): 12.5 g via INTRAVENOUS
  Filled 2019-09-01: qty 250

## 2019-09-01 MED ORDER — ALBUMIN HUMAN 5 % IV SOLN
INTRAVENOUS | Status: DC | PRN
  Administered 2019-09-01: 12:00:00 via INTRAVENOUS

## 2019-09-01 MED ORDER — POTASSIUM CHLORIDE 10 MEQ/50ML IV SOLN
10.0000 meq | INTRAVENOUS | Status: AC
  Administered 2019-09-01 (×3): 10 meq via INTRAVENOUS

## 2019-09-01 MED ORDER — PHENYLEPHRINE HCL (PRESSORS) 10 MG/ML IV SOLN
INTRAVENOUS | Status: DC | PRN
  Administered 2019-09-01: 120 ug via INTRAVENOUS

## 2019-09-01 MED ORDER — ONDANSETRON HCL 4 MG/2ML IJ SOLN
4.0000 mg | Freq: Four times a day (QID) | INTRAMUSCULAR | Status: DC | PRN
  Administered 2019-09-01 (×2): 4 mg via INTRAVENOUS
  Filled 2019-09-01 (×2): qty 2

## 2019-09-01 MED ORDER — ORAL CARE MOUTH RINSE
15.0000 mL | OROMUCOSAL | Status: DC
  Administered 2019-09-01: 16:00:00 15 mL via OROMUCOSAL

## 2019-09-01 MED ORDER — BISACODYL 5 MG PO TBEC
10.0000 mg | DELAYED_RELEASE_TABLET | Freq: Every day | ORAL | Status: DC
  Administered 2019-09-02 – 2019-09-04 (×3): 10 mg via ORAL
  Filled 2019-09-01 (×6): qty 2

## 2019-09-01 MED ORDER — ACETAMINOPHEN 160 MG/5ML PO SOLN: 1000.0000 mg | Freq: Four times a day (QID) | ORAL | Status: AC

## 2019-09-01 MED ORDER — INSULIN REGULAR(HUMAN) IN NACL 100-0.9 UT/100ML-% IV SOLN: INTRAVENOUS | Status: DC

## 2019-09-01 MED ORDER — VANCOMYCIN HCL IN DEXTROSE 1-5 GM/200ML-% IV SOLN
1000.0000 mg | Freq: Once | INTRAVENOUS | Status: AC
  Administered 2019-09-01: 1000 mg via INTRAVENOUS
  Filled 2019-09-01: qty 200

## 2019-09-01 MED ORDER — PROPOFOL 10 MG/ML IV BOLUS
INTRAVENOUS | Status: AC
  Filled 2019-09-01: qty 20

## 2019-09-01 MED ORDER — SODIUM CHLORIDE 0.9% FLUSH
10.0000 mL | Freq: Two times a day (BID) | INTRAVENOUS | Status: DC
  Administered 2019-09-01 – 2019-09-03 (×5): 10 mL

## 2019-09-01 MED ORDER — PROTAMINE SULFATE 10 MG/ML IV SOLN
INTRAVENOUS | Status: AC
  Filled 2019-09-01: qty 5

## 2019-09-01 MED ORDER — LIDOCAINE 2% (20 MG/ML) 5 ML SYRINGE
INTRAMUSCULAR | Status: DC | PRN
  Administered 2019-09-01: 100 mg via INTRAVENOUS

## 2019-09-01 MED ORDER — METOPROLOL TARTRATE 5 MG/5ML IV SOLN: 2.5000 mg | INTRAVENOUS | Status: DC | PRN

## 2019-09-01 MED ORDER — FENTANYL CITRATE (PF) 250 MCG/5ML IJ SOLN
INTRAMUSCULAR | Status: DC | PRN
  Administered 2019-09-01 (×2): 50 ug via INTRAVENOUS
  Administered 2019-09-01: 100 ug via INTRAVENOUS
  Administered 2019-09-01 (×2): 50 ug via INTRAVENOUS
  Administered 2019-09-01: 250 ug via INTRAVENOUS
  Administered 2019-09-01 (×5): 50 ug via INTRAVENOUS
  Administered 2019-09-01: 100 ug via INTRAVENOUS
  Administered 2019-09-01 (×7): 50 ug via INTRAVENOUS

## 2019-09-01 MED ORDER — METOPROLOL TARTRATE 12.5 MG HALF TABLET
12.5000 mg | ORAL_TABLET | Freq: Two times a day (BID) | ORAL | Status: DC
  Administered 2019-09-02 – 2019-09-03 (×2): 12.5 mg via ORAL
  Filled 2019-09-01 (×3): qty 1

## 2019-09-01 MED ORDER — BISACODYL 10 MG RE SUPP: 10.0000 mg | Freq: Every day | RECTAL | Status: DC

## 2019-09-01 MED ORDER — MIDAZOLAM HCL 2 MG/2ML IJ SOLN: 2.0000 mg | INTRAMUSCULAR | Status: DC | PRN

## 2019-09-01 MED ORDER — MAGNESIUM SULFATE 4 GM/100ML IV SOLN
4.0000 g | Freq: Once | INTRAVENOUS | Status: AC
  Administered 2019-09-01: 14:00:00 4 g via INTRAVENOUS
  Filled 2019-09-01: qty 100

## 2019-09-01 MED ORDER — SODIUM CHLORIDE 0.9% FLUSH: 3.0000 mL | INTRAVENOUS | Status: DC | PRN

## 2019-09-01 MED ORDER — PROPOFOL 10 MG/ML IV BOLUS
INTRAVENOUS | Status: DC | PRN
  Administered 2019-09-01: 50 mg via INTRAVENOUS

## 2019-09-01 MED ORDER — CHLORHEXIDINE GLUCONATE CLOTH 2 % EX PADS
6.0000 | MEDICATED_PAD | Freq: Every day | CUTANEOUS | Status: DC
  Administered 2019-09-01 – 2019-09-05 (×5): 6 via TOPICAL

## 2019-09-01 MED ORDER — INSULIN REGULAR BOLUS VIA INFUSION
0.0000 [IU] | Freq: Three times a day (TID) | INTRAVENOUS | Status: DC
  Filled 2019-09-01: qty 10

## 2019-09-01 MED ORDER — FAMOTIDINE IN NACL 20-0.9 MG/50ML-% IV SOLN
20.0000 mg | Freq: Two times a day (BID) | INTRAVENOUS | Status: DC
  Administered 2019-09-01: 13:00:00 20 mg via INTRAVENOUS

## 2019-09-01 MED ORDER — METOPROLOL TARTRATE 25 MG/10 ML ORAL SUSPENSION: 12.5000 mg | Freq: Two times a day (BID) | ORAL | Status: DC

## 2019-09-01 MED ORDER — PROTAMINE SULFATE 10 MG/ML IV SOLN
INTRAVENOUS | Status: DC | PRN
  Administered 2019-09-01: 270 mg via INTRAVENOUS
  Administered 2019-09-01: 20 mg via INTRAVENOUS

## 2019-09-01 MED ORDER — DOCUSATE SODIUM 100 MG PO CAPS
200.0000 mg | ORAL_CAPSULE | Freq: Every day | ORAL | Status: DC
  Administered 2019-09-02 – 2019-09-04 (×3): 200 mg via ORAL
  Filled 2019-09-01 (×6): qty 2

## 2019-09-01 MED ORDER — CHLORHEXIDINE GLUCONATE 0.12 % MT SOLN
15.0000 mL | OROMUCOSAL | Status: AC
  Administered 2019-09-01: 15 mL via OROMUCOSAL

## 2019-09-01 MED ORDER — ACETAMINOPHEN 160 MG/5ML PO SOLN
650.0000 mg | Freq: Once | ORAL | Status: AC
  Administered 2019-09-01: 14:00:00 650 mg

## 2019-09-01 MED ORDER — NITROGLYCERIN IN D5W 200-5 MCG/ML-% IV SOLN: 0.0000 ug/min | INTRAVENOUS | Status: DC

## 2019-09-01 MED ORDER — LACTATED RINGERS IV SOLN: 500.0000 mL | Freq: Once | INTRAVENOUS | Status: DC | PRN

## 2019-09-01 SURGICAL SUPPLY — 104 items
BAG DECANTER FOR FLEXI CONT (MISCELLANEOUS) ×8 IMPLANT
BASKET HEART  (ORDER IN 25'S) (MISCELLANEOUS) ×1
BASKET HEART (ORDER IN 25'S) (MISCELLANEOUS) ×1
BASKET HEART (ORDER IN 25S) (MISCELLANEOUS) ×2 IMPLANT
BLADE CLIPPER SURG (BLADE) ×4 IMPLANT
BLADE STERNUM SYSTEM 6 (BLADE) ×4 IMPLANT
BNDG ELASTIC 4X5.8 VLCR STR LF (GAUZE/BANDAGES/DRESSINGS) ×4 IMPLANT
BNDG ELASTIC 6X10 VLCR STRL LF (GAUZE/BANDAGES/DRESSINGS) ×2 IMPLANT
BNDG ELASTIC 6X5.8 VLCR STR LF (GAUZE/BANDAGES/DRESSINGS) ×4 IMPLANT
BNDG GAUZE ELAST 4 BULKY (GAUZE/BANDAGES/DRESSINGS) ×4 IMPLANT
CABLE SURGICAL S-101-97-12 (CABLE) ×2 IMPLANT
CANISTER SUCT 3000ML PPV (MISCELLANEOUS) ×4 IMPLANT
CANNULA EZ GLIDE 8.0 24FR (CANNULA) ×6 IMPLANT
CANNULA MC2 2 STG 29/37 NON-V (CANNULA) ×2 IMPLANT
CANNULA MC2 TWO STAGE (CANNULA) ×4
CATH CPB KIT HENDRICKSON (MISCELLANEOUS) ×4 IMPLANT
CLIP RETRACTION 3.0MM CORONARY (MISCELLANEOUS) ×4 IMPLANT
CLIP VESOCCLUDE MED 24/CT (CLIP) IMPLANT
CLIP VESOCCLUDE SM WIDE 24/CT (CLIP) IMPLANT
CONN ST 1/2X1/2  BEN (MISCELLANEOUS) ×2
CONN ST 1/2X1/2 BEN (MISCELLANEOUS) ×2 IMPLANT
CONNECTOR BLAKE 2:1 CARIO BLK (MISCELLANEOUS) ×2 IMPLANT
COVER WAND RF STERILE (DRAPES) ×4 IMPLANT
DRAIN CHANNEL 19F RND (DRAIN) ×4 IMPLANT
DRAPE CARDIOVASCULAR INCISE (DRAPES) ×2
DRAPE INCISE IOBAN 66X45 STRL (DRAPES) ×4 IMPLANT
DRAPE SLUSH/WARMER DISC (DRAPES) ×4 IMPLANT
DRAPE SRG 135X102X78XABS (DRAPES) ×2 IMPLANT
DRSG AQUACEL AG ADV 3.5X14 (GAUZE/BANDAGES/DRESSINGS) ×2 IMPLANT
DRSG COVADERM 4X14 (GAUZE/BANDAGES/DRESSINGS) ×4 IMPLANT
ELECT REM PT RETURN 9FT ADLT (ELECTROSURGICAL) ×8
ELECTRODE REM PT RTRN 9FT ADLT (ELECTROSURGICAL) ×4 IMPLANT
EVACUATOR SILICONE 100CC (DRAIN) ×4 IMPLANT
FELT TEFLON 1X6 (MISCELLANEOUS) ×8 IMPLANT
GAUZE SPONGE 4X4 12PLY STRL (GAUZE/BANDAGES/DRESSINGS) ×8 IMPLANT
GLOVE BIO SURGEON STRL SZ 6 (GLOVE) ×4 IMPLANT
GLOVE BIO SURGEON STRL SZ 6.5 (GLOVE) ×5 IMPLANT
GLOVE BIO SURGEON STRL SZ7 (GLOVE) ×12 IMPLANT
GLOVE BIO SURGEON STRL SZ7.5 (GLOVE) ×6 IMPLANT
GLOVE BIO SURGEONS STRL SZ 6.5 (GLOVE) ×5
GLOVE BIOGEL PI IND STRL 6.5 (GLOVE) IMPLANT
GLOVE BIOGEL PI IND STRL 7.0 (GLOVE) IMPLANT
GLOVE BIOGEL PI IND STRL 7.5 (GLOVE) ×4 IMPLANT
GLOVE BIOGEL PI IND STRL 8.5 (GLOVE) IMPLANT
GLOVE BIOGEL PI INDICATOR 6.5 (GLOVE) ×4
GLOVE BIOGEL PI INDICATOR 7.0 (GLOVE) ×2
GLOVE BIOGEL PI INDICATOR 7.5 (GLOVE) ×4
GLOVE BIOGEL PI INDICATOR 8.5 (GLOVE) ×2
GOWN STRL REUS W/ TWL LRG LVL3 (GOWN DISPOSABLE) ×8 IMPLANT
GOWN STRL REUS W/ TWL XL LVL3 (GOWN DISPOSABLE) ×4 IMPLANT
GOWN STRL REUS W/TWL LRG LVL3 (GOWN DISPOSABLE) ×20
GOWN STRL REUS W/TWL XL LVL3 (GOWN DISPOSABLE) ×4
HEMOSTAT POWDER SURGIFOAM 1G (HEMOSTASIS) ×12 IMPLANT
KIT BASIN OR (CUSTOM PROCEDURE TRAY) ×4 IMPLANT
KIT DRAINAGE VACCUM ASSIST (KITS) ×2 IMPLANT
KIT SUCTION CATH 14FR (SUCTIONS) ×12 IMPLANT
KIT TURNOVER KIT B (KITS) ×4 IMPLANT
KIT VASOVIEW HEMOPRO 2 VH 4000 (KITS) ×4 IMPLANT
LEAD PACING MYOCARDI (MISCELLANEOUS) ×6 IMPLANT
MARKER GRAFT CORONARY BYPASS (MISCELLANEOUS) ×12 IMPLANT
NS IRRIG 1000ML POUR BTL (IV SOLUTION) ×22 IMPLANT
OFFPUMP STABILIZER SUV (MISCELLANEOUS) ×2 IMPLANT
PACK E OPEN HEART (SUTURE) ×4 IMPLANT
PACK OPEN HEART (CUSTOM PROCEDURE TRAY) ×4 IMPLANT
PAD ARMBOARD 7.5X6 YLW CONV (MISCELLANEOUS) ×8 IMPLANT
PAD ELECT DEFIB RADIOL ZOLL (MISCELLANEOUS) ×4 IMPLANT
PENCIL BUTTON HOLSTER BLD 10FT (ELECTRODE) ×4 IMPLANT
POSITIONER HEAD DONUT 9IN (MISCELLANEOUS) ×4 IMPLANT
PUNCH AORTIC ROTATE 4.0MM (MISCELLANEOUS) IMPLANT
PUNCH AORTIC ROTATE 4.5MM 8IN (MISCELLANEOUS) ×2 IMPLANT
PUNCH AORTIC ROTATE 5MM 8IN (MISCELLANEOUS) IMPLANT
SET CARDIOPLEGIA MPS 5001102 (MISCELLANEOUS) ×2 IMPLANT
SPONGE LAP 18X18 RF (DISPOSABLE) ×6 IMPLANT
SPONGE LAP 4X18 RFD (DISPOSABLE) IMPLANT
SUT BONE WAX W31G (SUTURE) ×4 IMPLANT
SUT ETHILON 3 0 FSL (SUTURE) ×2 IMPLANT
SUT MNCRL AB 3-0 PS2 18 (SUTURE) ×8 IMPLANT
SUT PDS AB 1 CTX 36 (SUTURE) ×8 IMPLANT
SUT PROLENE 2 0 SH DA (SUTURE) IMPLANT
SUT PROLENE 3 0 SH DA (SUTURE) ×4 IMPLANT
SUT PROLENE 3 0 SH1 36 (SUTURE) IMPLANT
SUT PROLENE 4 0 RB 1 (SUTURE)
SUT PROLENE 4 0 SH DA (SUTURE) IMPLANT
SUT PROLENE 4-0 RB1 .5 CRCL 36 (SUTURE) IMPLANT
SUT PROLENE 5 0 C 1 36 (SUTURE) ×12 IMPLANT
SUT PROLENE 6 0 C 1 30 (SUTURE) IMPLANT
SUT PROLENE 7 0 BV 1 (SUTURE) ×10 IMPLANT
SUT PROLENE 8 0 BV175 6 (SUTURE) IMPLANT
SUT PROLENE BLUE 7 0 (SUTURE) ×4 IMPLANT
SUT PROLENE POLY MONO (SUTURE) IMPLANT
SUT STEEL 6MS V (SUTURE) ×8 IMPLANT
SUT VIC AB 2-0 CT1 27 (SUTURE) ×2
SUT VIC AB 2-0 CT1 TAPERPNT 27 (SUTURE) IMPLANT
SUT VIC AB 3-0 X1 27 (SUTURE) ×2 IMPLANT
SYSTEM SAHARA CHEST DRAIN ATS (WOUND CARE) ×4 IMPLANT
TAPE CLOTH SURG 4X10 WHT LF (GAUZE/BANDAGES/DRESSINGS) ×2 IMPLANT
TAPE PAPER 2X10 WHT MICROPORE (GAUZE/BANDAGES/DRESSINGS) ×2 IMPLANT
TOWEL GREEN STERILE (TOWEL DISPOSABLE) ×4 IMPLANT
TOWEL GREEN STERILE FF (TOWEL DISPOSABLE) ×4 IMPLANT
TRAY FOLEY SLVR 16FR TEMP STAT (SET/KITS/TRAYS/PACK) ×4 IMPLANT
TUBE SUCT INTRACARD DLP 20F (MISCELLANEOUS) ×2 IMPLANT
TUBING LAP HI FLOW INSUFFLATIO (TUBING) ×4 IMPLANT
UNDERPAD 30X30 (UNDERPADS AND DIAPERS) ×4 IMPLANT
WATER STERILE IRR 1000ML POUR (IV SOLUTION) ×8 IMPLANT

## 2019-09-01 NOTE — Op Note (Signed)
[address]       [place] [address]             [phone]                                          09/01/2019 Patient:  [name] [name] Pre-Op Dx: 3V CAD   Post-op Dx:  same Procedure: CABG X 4.  LIMA LAD, RSVG D1, OM2, PLV   Endoscopic greater saphenous vein harvest on the right Intra-operative Transesophageal Echocardiogram  Surgeon and Role:      * Deidrea Gaetz, Lucile Crater, MD - Primary    Evonnie Pat, PA-C - assisting   Anesthesia  general EBL:  543ml Blood Administration: none Xclamp Time:  61 min Pump Time:  187min  Drains: 23 F blake drain: L, mediastinal  Wires: A and V Counts: correct   Indications: 73 year old male with a long history of familial hyperlipidemia coronary artery disease underwent an elective LHC which showed severe 3 vessel disease Findings: Decent LAD target.  A 1.5 mm probe was able to pass down to the apex.  Good circumflex, targets good PLV.  Good conduit  Operative Technique: All invasive lines were placed in pre-op holding.  After the risks, benefits and alternatives were thoroughly discussed, the patient was brought to the operative theatre.  Anesthesia was induced, and the patient was prepped and draped in normal sterile fashion.  An appropriate surgical pause was performed, and pre-operative antibiotics were dosed accordingly.  We began with simultaneous incisions were made along the right leg for harvesting of the greater saphenous vein and the chest for the sternotomy.  In regards to the sternotomy, this was carried down with bovie cautery, and the sternum was divided with a reciprocating saw.  Meticulous hemostasis was obtained.  The left internal thoracic artery was exposed and harvested in in pedicled fashion.  The patient was systemically heparinized, and the artery was divided distally, and placed in a papaverine sponge.    The sternal elevator was removed, and a retractor was placed.  The pericardium was divided  in the midline and fashioned into a cradle with pericardial stitches.   After we confirmed an appropriate ACT, the ascending aorta was cannulated in standard fashion.  The right atrial appendage was used for venous cannulation site.  Cardiopulmonary bypass was initiated, and the heart retractor was placed. The cross clamp was applied, and a dose of anterograde cardioplegia was given with good arrest of the heart.  We moved to the posterior wall of the heart, and found a good target on the PLV.  An arteriotomy was made, and the vein graft was anastomosed to it in an end to side fashion.  Next we exposed the lateral wall, and found a good target on the OM2.  An end to side anastomosis with the vein graft was then created.  Next, we exposed the anterior wall of the heart and identified a good target on D1.   An arteriotomy was created.  The vein was anastomosed in an end to side fashion.  Finally, we exposed a good target on the  LAD, and fashioned an end to side anastomosis between it and the LITA.  We began to re-warm, and a re-animation dose of cardioplegia was given.  The heart was de-aired, and the cross clamp was removed.  Meticulous hemostasis was obtained.  A partial occludding clamp was then placed on the ascending aorta, and we created an end to side anastomosis between it and the proximal vein grafts.  The proximal sites were marked with rings.  Hemostasis was obtained, and we separated from cardiopulmonary bypass without event.the heparin was reversed with protamine.  Chest tubes and wires were placed, and the sternum was re-approximated with with sternal wires.  The soft tissue and skin were re-approximated wth absorbable suture.    The patient tolerated the procedure without any immediate complications, and was transferred to the ICU in guarded condition.  [name] [name] [name] [name]

## 2019-09-01 NOTE — Progress Notes (Signed)
   Currently in operating.

## 2019-09-01 NOTE — Anesthesia Postprocedure Evaluation (Signed)
Anesthesia Post Note  Patient: [name] [name]  Procedure(s) Performed: CORONARY ARTERY BYPASS GRAFTING (CABG) x Four , using left internal mammary artery and right leg greater saphenous vein harvested endoscopically (N/A Chest) Transesophageal Echocardiogram (Tee) (N/A )     Patient location during evaluation: SICU Anesthesia Type: General Level of consciousness: sedated Pain management: pain level controlled Vital Signs Assessment: post-procedure vital signs reviewed and stable Respiratory status: patient remains intubated per anesthesia plan Cardiovascular status: stable Postop Assessment: no apparent nausea or vomiting Anesthetic complications: no    Last Vitals:  Vitals:   09/01/19 0500 09/01/19 0600  BP:  114/77  Pulse:    Resp: 17 14  Temp:    SpO2: 100% 97%    Last Pain:  Vitals:   09/01/19 0347  TempSrc: Oral  PainSc:                  Catalina Gravel

## 2019-09-01 NOTE — Anesthesia Procedure Notes (Signed)
Procedure Name: Intubation Date/Time: 09/01/2019 7:43 AM Performed by: Clearnce Sorrel, CRNA Pre-anesthesia Checklist: Patient identified, Emergency Drugs available, Suction available, Patient being monitored and Timeout performed Patient Re-evaluated:Patient Re-evaluated prior to induction Oxygen Delivery Method: Circle system utilized Preoxygenation: Pre-oxygenation with 100% oxygen Induction Type: IV induction Ventilation: Mask ventilation without difficulty and Oral airway inserted - appropriate to patient size Laryngoscope Size: Mac and 4 Grade View: Grade I Tube type: Oral Tube size: 8.0 mm Number of attempts: 1 Airway Equipment and Method: Stylet Placement Confirmation: ETT inserted through vocal cords under direct vision,  positive ETCO2 and breath sounds checked- equal and bilateral Secured at: 23 cm Tube secured with: Tape Dental Injury: Teeth and Oropharynx as per pre-operative assessment

## 2019-09-01 NOTE — Procedures (Signed)
Extubation Procedure Note  Patient Details:   Name: [name] [name] DOB: 01-01-1946 MRN: [id number]   Airway Documentation:    Vent end date: 09/01/19 Vent end time: 1702   Evaluation  O2 sats: stable throughout Complications: No apparent complications Patient did tolerate procedure well. Bilateral Breath Sounds: Clear   Yes   Pt extubated to 4L N/C.  No stridor noted.  RN @ bedside.  VC ~1098 mL, NIF -30.  Donnetta Hail 09/01/2019, 5:03 PM

## 2019-09-01 NOTE — Anesthesia Procedure Notes (Signed)
Central Venous Catheter Insertion Performed by: Catalina Gravel, MD, anesthesiologist Start/End10/23/2020 6:45 AM, 09/01/2019 6:55 AM Patient location: Pre-op. Preanesthetic checklist: patient identified, IV checked, site marked, risks and benefits discussed, surgical consent, monitors and equipment checked, pre-op evaluation, timeout performed and anesthesia consent Position: Trendelenburg Lidocaine 1% used for infiltration and patient sedated Hand hygiene performed , maximum sterile barriers used  and Seldinger technique used Catheter size: 8.5 Fr Central line was placed.Sheath introducer Procedure performed using ultrasound guided technique. Ultrasound Notes:anatomy identified, needle tip was noted to be adjacent to the nerve/plexus identified, no ultrasound evidence of intravascular and/or intraneural injection and image(s) printed for medical record Attempts: 1 Following insertion, line sutured, dressing applied and Biopatch. Post procedure assessment: free fluid flow, blood return through all ports and no air  Patient tolerated the procedure well with no immediate complications.

## 2019-09-01 NOTE — Transfer of Care (Signed)
Immediate Anesthesia Transfer of Care Note  Patient: [name] [name]  Procedure(s) Performed: CORONARY ARTERY BYPASS GRAFTING (CABG) x Four , using left internal mammary artery and right leg greater saphenous vein harvested endoscopically (N/A Chest) Transesophageal Echocardiogram Darden Dates) (N/A )  Patient Location: SICU  Anesthesia Type:General  Level of Consciousness: Patient remains intubated per anesthesia plan  Airway & Oxygen Therapy: Patient remains intubated per anesthesia plan  Post-op Assessment: Report given to RN and Post -op Vital signs reviewed and stable  Post vital signs: Reviewed and stable  Last Vitals:  Vitals Value Taken Time  BP    Temp    Pulse    Resp    SpO2      Last Pain:  Vitals:   09/01/19 0347  TempSrc: Oral  PainSc:          Complications: No apparent anesthesia complications

## 2019-09-01 NOTE — Anesthesia Procedure Notes (Signed)
Central Venous Catheter Insertion Performed by: Catalina Gravel, MD, anesthesiologist Start/End10/23/2020 6:55 AM, 09/01/2019 7:00 AM Patient location: Pre-op. Preanesthetic checklist: patient identified, IV checked, site marked, risks and benefits discussed, surgical consent, monitors and equipment checked, pre-op evaluation and timeout performed Position: Trendelenburg Hand hygiene performed  and maximum sterile barriers used  Total catheter length 100. PA cath was placed.Swan type:thermodilution PA Cath depth:55 Procedure performed without using ultrasound guided technique. Attempts: 1 Patient tolerated the procedure well with no immediate complications.

## 2019-09-01 NOTE — Progress Notes (Signed)
  Echocardiogram Echocardiogram Transesophageal has been performed.  [name] [name] 09/01/2019, 8:18 AM

## 2019-09-01 NOTE — Progress Notes (Addendum)
Progress Note  Patient Name: [name] [name] Date of Encounter: 09/01/2019  Primary Cardiologist: No primary care provider on file.   Subjective   Currently in the OR undergoing CABG  Inpatient Medications    Scheduled Meds:  [MAR Hold] aspirin  81 mg Oral Daily   [MAR Hold] Chlorhexidine Gluconate Cloth  6 each Topical Daily   epinephrine  0-10 mcg/min Intravenous To OR   [MAR Hold] ezetimibe  10 mg Oral Daily   heparin-papaverine-plasmalyte irrigation   Irrigation To OR   insulin   Intravenous To OR   [facility] Blood Cardioplegia vial (lidocaine/magnesium/mannitol 0.26g-4g-6.4g)   Intracoronary Once   [MAR Hold] loratadine  10 mg Oral Daily   metoprolol tartrate  12.5 mg Oral Once   [MAR Hold] niacin  1,000 mg Oral QHS   [MAR Hold] oxymetazoline  1 spray Each Nare BID   phenylephrine  30-200 mcg/min Intravenous To OR   potassium chloride  80 mEq Other To OR   [MAR Hold] rosuvastatin  40 mg Oral QPM   [MAR Hold] sodium chloride flush  3 mL Intravenous Q12H   tranexamic acid  15 mg/kg Intravenous To OR   tranexamic acid  2 mg/kg Intracatheter To OR   Continuous Infusions:  sodium chloride 100 mL/hr at 09/01/19 0600   [MAR Hold] sodium chloride     cefUROXime (ZINACEF)  IV     cefUROXime (ZINACEF)  IV     dexmedetomidine     DOPamine     heparin 30,000 units/NS 1000 mL solution for CELLSAVER     heparin 1,000 Units/hr (09/01/19 0600)   milrinone     nitroGLYCERIN     tranexamic acid (CYKLOKAPRON) infusion ([facility])     vancomycin     PRN Meds: [MAR Hold] sodium chloride, [MAR Hold] acetaminophen, [MAR Hold] diazepam, [MAR Hold] ondansetron (ZOFRAN) IV, [MAR Hold] sodium chloride, [MAR Hold] sodium chloride flush   Vital Signs    Vitals:   09/01/19 0347 09/01/19 0400 09/01/19 0500 09/01/19 0600  BP:  108/73  114/77  Pulse:      Resp:  14 17 14   Temp: 97.9 F (36.6 C)     TempSrc: Oral     SpO2:  96% 100% 97%  Weight:        Height:        Intake/Output Summary (Last 24 hours) at 09/01/2019 0626 Last data filed at 09/01/2019 0600 Gross per 24 hour  Intake 3071.65 ml  Output 1100 ml  Net 1971.65 ml   Filed Weights   08/31/19 0600  Weight: 97.1 kg    Telemetry    Bradycardia otherwise unremarkable- Personally Reviewed  ECG    None today- Personally Reviewed  Physical Exam   Unable to assess as patient is currently in the [facility]  Lab 08/28/19 1151 08/31/19 1255 08/31/19 1312 09/01/19 0500  NA 138 141 139 141  K 4.3 3.4* 4.0 4.2  CL 100  --  104 107  CO2 25  --  25 25  GLUCOSE 91  --  98 95  BUN 16  --  13 11  CREATININE 1.25  --  1.08 0.91  CALCIUM 9.4  --  9.3 9.0  PROT  --   --  5.9*  --   ALBUMIN  --   --  3.8  --   AST  --   --  24  --   ALT  --   --  19  --   ALKPHOS  --   --  35*  --   BILITOT  --   --  0.7  --   GFRNONAA 56*  --  >60 >60  GFRAA 65  --  >60 >60  ANIONGAP  --   --  10 9     Hematology Recent Labs  Lab 08/28/19 1151 08/31/19 1255 08/31/19 1312  WBC 5.6  --  4.6  RBC 4.15  --  3.87*  HGB 13.4 13.3 12.9*  HCT 41.0 39.0 37.7*  MCV 99*  --  97.4  MCH 32.3  --  33.3  MCHC 32.7  --  34.2  RDW 13.1  --  12.7  PLT 167  --  140*    Cardiac EnzymesNo results for input(s): TROPONINI in the last 168 hours. No results for input(s): TROPIPOC in the last 168 hours.   BNPNo results for input(s): BNP, PROBNP in the last 168 hours.   DDimer No results for input(s): DDIMER in the last 168 hours.   Radiology    Dg Chest Port 1 View  Result Date: 08/31/2019 CLINICAL DATA:  Preop evaluation for upcoming heart surgery, shortness of breath EXAM: PORTABLE CHEST 1 VIEW COMPARISON:  None. FINDINGS: The heart size and mediastinal contours are within normal limits. Both lungs are clear. The visualized skeletal structures are unremarkable. IMPRESSION: No active disease. Electronically Signed   By: Inez Catalina M.D.   On: 08/31/2019 12:43    Vas US Doppler Pre Cabg  Result Date: 08/31/2019 PREOPERATIVE VASCULAR EVALUATION  Indications:      Pre-CABG. Risk Factors:     Coronary artery disease. Comparison Study: No prior studies. Performing Technologist: Carlos Levering Rvt  Examination Guidelines: A complete evaluation includes B-mode imaging, spectral Doppler, color Doppler, and power Doppler as needed of all accessible portions of each vessel. Bilateral testing is considered an integral part of a complete examination. Limited examinations for reoccurring indications may be performed as noted.  Right Carotid Findings: +----------+--------+--------+--------+-----------------------+--------+             PSV cm/s EDV cm/s Stenosis Describe                Comments  +----------+--------+--------+--------+-----------------------+--------+  CCA Prox   63       17                smooth and heterogenous           +----------+--------+--------+--------+-----------------------+--------+  CCA Distal 59       18                smooth and heterogenous           +----------+--------+--------+--------+-----------------------+--------+  ICA Prox   48       16                smooth and heterogenous           +----------+--------+--------+--------+-----------------------+--------+  ICA Distal 60       28                                        tortuous  +----------+--------+--------+--------+-----------------------+--------+  ECA        58       10                                                  +----------+--------+--------+--------+-----------------------+--------+  Portions of this table do not appear on this page. +----------+--------+-------+--------+------------+             PSV cm/s EDV cms Describe Arm Pressure  +----------+--------+-------+--------+------------+  Subclavian 64                                      +----------+--------+-------+--------+------------+ +---------+--------+--+--------+--+---------+  Vertebral PSV cm/s 48 EDV cm/s 17 Antegrade   +---------+--------+--+--------+--+---------+ Left Carotid Findings: +----------+--------+--------+--------+-----------------------+--------+             PSV cm/s EDV cm/s Stenosis Describe                Comments  +----------+--------+--------+--------+-----------------------+--------+  CCA Prox   83       22                smooth and heterogenous           +----------+--------+--------+--------+-----------------------+--------+  CCA Distal 64       22                smooth                  tortuous  +----------+--------+--------+--------+-----------------------+--------+  ICA Prox   89       39                smooth and heterogenous tortuous  +----------+--------+--------+--------+-----------------------+--------+  ICA Distal 121      49                                        tortuous  +----------+--------+--------+--------+-----------------------+--------+  ECA        61       11                                                  +----------+--------+--------+--------+-----------------------+--------+ +----------+--------+--------+--------+------------+  Subclavian PSV cm/s EDV cm/s Describe Arm Pressure  +----------+--------+--------+--------+------------+             78                         118           +----------+--------+--------+--------+------------+ +---------+--------+--+--------+--+---------+  Vertebral PSV cm/s 25 EDV cm/s 10 Antegrade  +---------+--------+--+--------+--+---------+  ABI Findings: +--------+------------------+-----+---------+--------+  Right    Rt Pressure (mmHg) Index Waveform  Comment   +--------+------------------+-----+---------+--------+  Brachial                                    TR band   +--------+------------------+-----+---------+--------+  PTA      140                1.19  triphasic           +--------+------------------+-----+---------+--------+  DP       142                1.20  triphasic           +--------+------------------+-----+---------+--------+  +--------+------------------+-----+---------+-------+  Left     Lt Pressure (mmHg) Index Waveform  Comment  +--------+------------------+-----+---------+-------+  Brachial 118  triphasic          +--------+------------------+-----+---------+-------+  PTA      144                1.22  triphasic          +--------+------------------+-----+---------+-------+  DP       140                1.19  triphasic          +--------+------------------+-----+---------+-------+  Right Doppler Findings: +--------+--------+-----+-------+--------+  Site     Pressure Index Doppler Comments  +--------+--------+-----+-------+--------+  Brachial                        TR band   +--------+--------+-----+-------+--------+  Left Doppler Findings: +-----------+--------+-----+---------+-----------------------------------------+  Site        Pressure Index Doppler   Comments                                   +-----------+--------+-----+---------+-----------------------------------------+  Brachial    118            triphasic                                            +-----------+--------+-----+---------+-----------------------------------------+  Radial                     triphasic                                            +-----------+--------+-----+---------+-----------------------------------------+  Ulnar                      triphasic                                            +-----------+--------+-----+---------+-----------------------------------------+  Palmar Arch                          Palmar waveforms are obliterated with                                            radial and ulnar compression.              +-----------+--------+-----+---------+-----------------------------------------+  Summary: Right Carotid: Velocities in the right ICA are consistent with a 1-39% stenosis. Left Carotid: Velocities in the left ICA are consistent with a 1-39% stenosis. Vertebrals: Bilateral vertebral arteries demonstrate  antegrade flow. Right ABI: Resting right ankle-brachial index is within normal range. No evidence of significant right lower extremity arterial disease. Left ABI: Resting left ankle-brachial index is within normal range. No evidence of significant left lower extremity arterial disease.  Electronically signed by Servando Snare MD on 08/31/2019 at 2:15:51 PM.    Final     Cardiac Studies   08/31/2019-left heart cath   Ost RCA to Prox RCA lesion is 100% stenosed.  Dist LM to Prox LAD lesion is 99% stenosed.  Ost Cx lesion is 80% stenosed.  Prox LAD to Mid LAD lesion is 100% stenosed.  Mid LM lesion is 80% stenosed.  Prox Cx lesion is 80% stenosed.   Severe  native coronary artery disease with 80% calcified distal left main stenosis; 99% ostial LAD stenosis with total occlusion of the LAD after the proximal diagonal and septal perforating artery; 80% ostial circumflex stenosis followed by 80% proximal stenosis with extensive collateralization to a dominant RCA from the circumflex vessel as well as faint collaterals to the distal LAD, and total proximal RCA occlusion.  LVEDP 14 mm  07/11/2019-echocardiogram   1. The left ventricle has normal systolic function with an ejection fraction of 60-65%. The cavity size was normal. Left ventricular diastolic Doppler parameters are consistent with impaired relaxation.  2. The right ventricle has normal systolic function. The cavity was normal. There is no increase in right ventricular wall thickness.  3. Left atrial size was mildly dilated.  4. The aortic valve is grossly normal. No stenosis of the aortic valve.  5. The aorta is abnormal unless otherwise noted.  6. There is moderate dilatation of the ascending aorta.  7. The atrial septum is grossly normal.  07/13/2019-nuclear study scan   The left ventricular ejection fraction is moderately decreased (30-44%).  Nuclear stress EF: 39%.  Horizontal ST segment depression ST segment depression was  noted during stress in the II, III, aVF, V5 and V6 leads, and returning to baseline after 1-5 minutes of recovery.  Defect 1: There is a large defect of moderate severity present in the basal anteroseptal, basal inferoseptal, basal inferior, basal inferolateral, mid inferoseptal, mid inferior and apical inferior location.  Defect 2: There is a large defect of severe severity present in the basal anterior, mid anterior, mid anteroseptal, apical anterior, apical septal and apex location.  Findings consistent with ischemia and prior myocardial infarction.  This is a high risk study.  Changed from prior study.   TID very abnormal at >1.6, suggests ischemic dilitation. Severely abnormal stress test, EF estimated at 39% but there is both hypokinesis and dyskinesis (apical and distal septal). Severely reduced perfusion with severe stress defect in anterior and anteroseptal regions as well as fixed defect inferior/inferoseptal. Very concerning for severe ischemia and likely infarct.    Patient Profile     73 y.o. male with familial hyperlipidemia, CAD (found on prior LHC which revealed 20-40% RCA stenoses, 20-30% circumflex stenoses, at least moderate LAD stenosis), essential hypertension who had been experiencing progressive dyspnea on exertion and presented for an elective LHC which revealed severe left main disease and severe three-vessel disease now scheduled to undergo CABG x4  Assessment & Plan    #Coronary artery disease due to lipid plaque #Severe left main disease, severe three-vessel disease He presented to the hospital yesterday for an elective left heart cath as he has been experiencing progressive dyspnea on exertion and chest tightness which is worse in for the last several months.  His LHC yesterday revealed severe left main disease and three-vessel disease now scheduled to undergo CABG.  #Essential hypertension BP currently stable  #Familial hyperlipidemia Current medications  are Zetia, Niaspan, Crestor  #Prediabetes A1c of 6.1% -Will encourage lifestyle modification and recheck A1c in about 3 months  For questions or updates, please contact [facility] Please consult [email or web address] for contact info under Cardiology/STEMI.      Signed, Jean Rosenthal, MD  09/01/2019, 6:26 AM

## 2019-09-01 NOTE — Anesthesia Procedure Notes (Signed)
Arterial Line Insertion Start/End10/23/2020 6:45 AM, 09/01/2019 6:50 AM Performed by: Clearnce Sorrel, CRNA  Patient location: Pre-op. Preanesthetic checklist: patient identified, IV checked, site marked, risks and benefits discussed, surgical consent, monitors and equipment checked, pre-op evaluation, timeout performed and anesthesia consent Lidocaine 1% used for infiltration Left, radial was placed Catheter size: 20 Fr Hand hygiene performed  and maximum sterile barriers used   Attempts: 1 Procedure performed without using ultrasound guided technique. Following insertion, dressing applied. Post procedure assessment: normal and unchanged

## 2019-09-01 NOTE — Brief Op Note (Signed)
08/31/2019 - 09/01/2019  2:14 PM  PATIENT:  [name] [name]  73 y.o. male  PRE-OPERATIVE DIAGNOSIS:  Coronary Artery Disease  POST-OPERATIVE DIAGNOSIS:  Coronary Artery Disease  PROCEDURE:  Procedure(s): CORONARY ARTERY BYPASS GRAFTING (CABG) x Four , using left internal mammary artery and right leg greater saphenous vein harvested endoscopically (N/A) Transesophageal Echocardiogram (Tee) (N/A) LIMA-LAD SVG-CX SVG-DIAG SVG-PL  SURGEON:  Surgeon(s) and Role:    * Lightfoot, Lucile Crater, MD - Primary  PHYSICIAN ASSISTANT: WAYNE GOLD PA-C  ANESTHESIA:   general  EBL:  675 mL   BLOOD ADMINISTERED:none  DRAINS: LEFT PLEURAL AND PERICARDIAL CHEST TUBES   LOCAL MEDICATIONS USED:  NONE  SPECIMEN:  No Specimen  DISPOSITION OF SPECIMEN:  N/A  COUNTS:  YES  TOURNIQUET:  * No tourniquets in log *  DICTATION: .Dragon Dictation  PLAN OF CARE: Admit to inpatient   PATIENT DISPOSITION:  ICU - intubated and hemodynamically stable.   Delay start of Pharmacological VTE agent (>24hrs) due to surgical blood loss or risk of bleeding: yes

## 2019-09-01 NOTE — Progress Notes (Signed)
     [address]       [place] [address]             [phone]       No events  Vitals:   09/01/19 0500 09/01/19 0600  BP:  114/77  Pulse:    Resp: 17 14  Temp:    SpO2: 100% 97%   Sinus brady EWOB  Labs and studies reviewed  LM 3V CAD Or today for CABG 4

## 2019-09-02 ENCOUNTER — Inpatient Hospital Stay (HOSPITAL_COMMUNITY): Payer: 59

## 2019-09-02 DIAGNOSIS — Z951 Presence of aortocoronary bypass graft: Secondary | ICD-10-CM | POA: Diagnosis not present

## 2019-09-02 DIAGNOSIS — I10 Essential (primary) hypertension: Secondary | ICD-10-CM

## 2019-09-02 DIAGNOSIS — E7849 Other hyperlipidemia: Secondary | ICD-10-CM

## 2019-09-02 DIAGNOSIS — I25110 Atherosclerotic heart disease of native coronary artery with unstable angina pectoris: Secondary | ICD-10-CM | POA: Diagnosis not present

## 2019-09-02 LAB — GLUCOSE, CAPILLARY
Glucose-Capillary: 100 mg/dL — ABNORMAL HIGH (ref 70–99)
Glucose-Capillary: 109 mg/dL — ABNORMAL HIGH (ref 70–99)
Glucose-Capillary: 110 mg/dL — ABNORMAL HIGH (ref 70–99)
Glucose-Capillary: 111 mg/dL — ABNORMAL HIGH (ref 70–99)
Glucose-Capillary: 125 mg/dL — ABNORMAL HIGH (ref 70–99)
Glucose-Capillary: 127 mg/dL — ABNORMAL HIGH (ref 70–99)
Glucose-Capillary: 141 mg/dL — ABNORMAL HIGH (ref 70–99)
Glucose-Capillary: 153 mg/dL — ABNORMAL HIGH (ref 70–99)
Glucose-Capillary: 57 mg/dL — ABNORMAL LOW (ref 70–99)

## 2019-09-02 LAB — BASIC METABOLIC PANEL
Anion gap: 11 (ref 5–15)
Anion gap: 9 (ref 5–15)
BUN: 10 mg/dL (ref 8–23)
BUN: 11 mg/dL (ref 8–23)
CO2: 17 mmol/L — ABNORMAL LOW (ref 22–32)
CO2: 21 mmol/L — ABNORMAL LOW (ref 22–32)
Calcium: 7.8 mg/dL — ABNORMAL LOW (ref 8.9–10.3)
Calcium: 8.1 mg/dL — ABNORMAL LOW (ref 8.9–10.3)
Chloride: 112 mmol/L — ABNORMAL HIGH (ref 98–111)
Chloride: 106 mmol/L (ref 98–111)
Creatinine, Ser: 1.02 mg/dL (ref 0.61–1.24)
Creatinine, Ser: 1.07 mg/dL (ref 0.61–1.24)
GFR calc Af Amer: >60 mL/min (ref >60)
GFR calc Af Amer: >60 mL/min (ref >60)
GFR calc non Af Amer: >60 mL/min (ref >60)
GFR calc non Af Amer: >60 mL/min (ref >60)
Glucose, Bld: 131 mg/dL — ABNORMAL HIGH (ref 70–99)
Glucose, Bld: 141 mg/dL — ABNORMAL HIGH (ref 70–99)
Potassium: 3.9 mmol/L (ref 3.5–5.1)
Potassium: 3.7 mmol/L (ref 3.5–5.1)
Sodium: 140 mmol/L (ref 135–145)
Sodium: 136 mmol/L (ref 135–145)

## 2019-09-02 LAB — CBC
HCT: 26.8 % — ABNORMAL LOW (ref 39.0–52.0)
HCT: 25 % — ABNORMAL LOW (ref 39.0–52.0)
Hemoglobin: 8.9 g/dL — ABNORMAL LOW (ref 13.0–17.0)
Hemoglobin: 8.3 g/dL — ABNORMAL LOW (ref 13.0–17.0)
MCH: 33.2 pg (ref 26.0–34.0)
MCH: 32.8 pg (ref 26.0–34.0)
MCHC: 33.2 g/dL (ref 30.0–36.0)
MCHC: 33.2 g/dL (ref 30.0–36.0)
MCV: 100 fL (ref 80.0–100.0)
MCV: 98.8 fL (ref 80.0–100.0)
Platelets: 101 10*3/uL — ABNORMAL LOW (ref 150–400)
Platelets: 103 10*3/uL — ABNORMAL LOW (ref 150–400)
RBC: 2.68 MIL/uL — ABNORMAL LOW (ref 4.22–5.81)
RBC: 2.53 MIL/uL — ABNORMAL LOW (ref 4.22–5.81)
RDW: 13.3 % (ref 11.5–15.5)
RDW: 13.5 % (ref 11.5–15.5)
WBC: 9.5 10*3/uL (ref 4.0–10.5)
WBC: 11.3 10*3/uL — ABNORMAL HIGH (ref 4.0–10.5)
nRBC: 0 % (ref 0.0–0.2)
nRBC: 0 % (ref 0.0–0.2)

## 2019-09-02 LAB — MAGNESIUM
Magnesium: 2.6 mg/dL — ABNORMAL HIGH (ref 1.7–2.4)
Magnesium: 2.5 mg/dL — ABNORMAL HIGH (ref 1.7–2.4)

## 2019-09-02 MED ORDER — INSULIN ASPART 100 UNIT/ML ~~LOC~~ SOLN
0.0000 [IU] | SUBCUTANEOUS | Status: DC
  Administered 2019-09-02 (×4): 2 [IU] via SUBCUTANEOUS

## 2019-09-02 NOTE — Progress Notes (Signed)
When pt stood up at bedside this morning, his SBP dropped to the 50s, pt just stared off into space. We sat pt back down and he stated he was light headed and has previous episodes of being lightheaded and fainting. Once back in bed, pt's vitals became stable.This nurse will pass on to oncoming nurse.

## 2019-09-02 NOTE — Plan of Care (Signed)

## 2019-09-02 NOTE — Progress Notes (Signed)
1 Day Post-Op Procedure(s) (LRB): CORONARY ARTERY BYPASS GRAFTING (CABG) x Four , using left internal mammary artery and right leg greater saphenous vein harvested endoscopically (N/A) Transesophageal Echocardiogram (Tee) (N/A) Subjective: No complaints  Objective: Vital signs in last 24 hours: Temp:  [96.8 F (36 C)-99.9 F (37.7 C)] 97.7 F (36.5 C) (10/24 1546) Pulse Rate:  [78-95] 86 (10/24 1500) Cardiac Rhythm: Normal sinus rhythm (10/24 0800) Resp:  [14-28] 16 (10/24 1500) BP: (78-132)/(51-86) 97/63 (10/24 1500) SpO2:  [89 %-100 %] 96 % (10/24 1500) Arterial Line BP: (55-149)/(35-82) 109/52 (10/24 1500) FiO2 (%):  [40 %] 40 % (10/23 1618) Weight:  [104.4 kg] 104.4 kg (10/24 0600)  Hemodynamic parameters for last 24 hours: PAP: (24-54)/(1-19) 30/9 CO:  [4.1 L/min-6.2 L/min] 6.2 L/min CI:  [2 L/min/m2-3 L/min/m2] 3 L/min/m2  Intake/Output from previous day: 10/23 0701 - 10/24 0700 In: 6164.5 [P.O.:720; I.V.:3416.5; Blood:692; IV [id number] Out: [id number] [Urine:3760; Blood:675; Chest Tube:390] Intake/Output this shift: Total I/O In: 388.3 [I.V.:288.3; IV Piggyback:100] Out: 330 [Urine:280; Chest Tube:50]  General appearance: alert, cooperative and no distress Neurologic: intact Heart: regular rate and rhythm, S1, S2 normal, no murmur, click, rub or gallop Lungs: clear to auscultation bilaterally Extremities: extremities normal, atraumatic, no cyanosis or edema Wound: dressed, dry  Lab Results: Recent Labs    09/01/19 1905 09/02/19 0354  WBC 11.9* 9.5  HGB 8.8* 8.9*  HCT 27.0* 26.8*  PLT 119* 101*   BMET:  Recent Labs    09/01/19 1905 09/02/19 0354  NA 139 140  K 4.3 3.9  CL 110 112*  CO2 19* 17*  GLUCOSE 130* 131*  BUN 9 10  CREATININE 1.01 1.02  CALCIUM 8.0* 7.8*    PT/INR:  Recent Labs    09/01/19 1400  LABPROT 17.8*  INR 1.5*   ABG    Component Value Date/Time   PHART 7.314 (L) 09/01/2019 1806   HCO3 19.4 (L) 09/01/2019 1806   TCO2  21 (L) 09/01/2019 1806   ACIDBASEDEF 6.0 (H) 09/01/2019 1806   O2SAT 97.0 09/01/2019 1806   CBG (last 3)  Recent Labs    09/02/19 1129 09/02/19 1133 09/02/19 1548  GLUCAP 57* 141* 125*    Assessment/Plan: S/P Procedure(s) (LRB): CORONARY ARTERY BYPASS GRAFTING (CABG) x Four , using left internal mammary artery and right leg greater saphenous vein harvested endoscopically (N/A) Transesophageal Echocardiogram (Tee) (N/A) Mobilize Diuresis oob to chair; advance diet as tol   LOS: 2 days    [name] [name] 09/02/2019

## 2019-09-02 NOTE — Progress Notes (Signed)
DAILY PROGRESS NOTE   Patient Name: [name] [name] Date of Encounter: 09/02/2019 Cardiologist: No primary care provider on file.  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  Subjective    POD #1 s/p CABG. No complaints. Significantly volume up.  On neo with MAP's in the 70's. Mild anemia on labs 9/27 this am. In sinus.  Objective   Vitals:   09/02/19 0600 09/02/19 0635 09/02/19 0700 09/02/19 0800  BP: (!) 93/59  (!) 84/59 101/65  Pulse: 86 95 86 88  Resp: (!) 21 20 (!) 26 (!) 22  Temp: 99.7 F (37.6 C) 99.7 F (37.6 C) 99.9 F (37.7 C) 99.5 F (37.5 C)  TempSrc:      SpO2: 91% 99% 92% 95%  Weight: 104.4 kg     Height:        Intake/Output Summary (Last 24 hours) at 09/02/2019 0844 Last data filed at 09/02/2019 0800 Gross per 24 hour  Intake 6164.45 ml  Output 4980 ml  Net 1184.45 ml   Filed Weights   08/31/19 0600 09/02/19 0600  Weight: 97.1 kg 104.4 kg    Physical Exam   General appearance: alert and no distress Neck: no carotid bruit, no JVD and thyroid not enlarged, symmetric, no tenderness/mass/nodules Lungs: clear to auscultation bilaterally Heart: regular rate and rhythm Abdomen: soft, non-tender; bowel sounds normal; no masses,  no organomegaly Extremities: edema 1+ edema Pulses: 2+ and symmetric Skin: Skin color, texture, turgor normal. No rashes or lesions Neurologic: Grossly normal Psych: Pleasant  Inpatient Medications    Scheduled Meds: . acetaminophen  1,000 mg Oral Q6H   Or  . acetaminophen (TYLENOL) oral liquid 160 mg/5 mL  1,000 mg Per Tube Q6H  . aspirin EC  325 mg Oral Daily   Or  . aspirin  324 mg Per Tube Daily  . bisacodyl  10 mg Oral Daily   Or  . bisacodyl  10 mg Rectal Daily  . Chlorhexidine Gluconate Cloth  6 each Topical Daily  . docusate  sodium  200 mg Oral Daily  . ezetimibe  10 mg Oral Daily  . insulin aspart  0-24 Units Subcutaneous Q4H  . metoprolol tartrate  12.5 mg Oral BID   Or  . metoprolol tartrate  12.5 mg Per Tube BID  . [START ON 09/03/2019] pantoprazole  40 mg Oral Daily  . rosuvastatin  40 mg Oral QPM  . sodium chloride flush  10-40 mL Intracatheter Q12H  . sodium chloride flush  3 mL Intravenous Q12H    Continuous Infusions: . sodium chloride 20 mL/hr at 09/02/19 0700  . sodium chloride    . sodium chloride 20 mL/hr at 09/01/19 1325  . albumin human 12.5 g (09/01/19 1800)  . cefUROXime (ZINACEF)  IV 1.5 g (09/02/19 0812)  . dexmedetomidine (PRECEDEX) IV infusion Stopped (09/01/19 1631)  . lactated ringers    . lactated ringers    . lactated ringers 10 mL/hr at 09/02/19 0700  . nitroGLYCERIN Stopped (09/01/19 1329)  . phenylephrine (NEO-SYNEPHRINE) Adult infusion 45 mcg/min (09/02/19 0700)    PRN Meds: sodium chloride, albumin human, lactated ringers, metoprolol tartrate, morphine injection, ondansetron (ZOFRAN) IV, oxyCODONE, sodium chloride flush, sodium chloride flush, traMADol   Labs   Results for orders placed or performed during the hospital  encounter of 08/31/19 (from the past 48 hour(s))  MRSA PCR Screening     Status: None   Collection Time: 08/31/19 10:24 AM   Specimen: Nasopharyngeal  Result Value Ref Range   MRSA by PCR NEGATIVE NEGATIVE    Comment:        The GeneXpert MRSA Assay (FDA approved for NASAL specimens only), is one component of a comprehensive MRSA colonization surveillance program. It is not intended to diagnose MRSA infection nor to guide or monitor treatment for MRSA infections. Performed at [facility], [place] [address] [place], [place] [address]   Urinalysis, Routine w reflex microscopic     Status: Abnormal   Collection Time: 08/31/19 12:05 PM  Result Value Ref Range   Color, Urine STRAW (A) YELLOW   APPearance CLEAR CLEAR   Specific Gravity,  Urine 1.015 1.005 - 1.030   pH 6.0 5.0 - 8.0   Glucose, UA NEGATIVE NEGATIVE mg/dL   Hgb urine dipstick NEGATIVE NEGATIVE   Bilirubin Urine NEGATIVE NEGATIVE   Ketones, ur NEGATIVE NEGATIVE mg/dL   Protein, ur NEGATIVE NEGATIVE mg/dL   Nitrite NEGATIVE NEGATIVE   Leukocytes,Ua NEGATIVE NEGATIVE    Comment: Performed at [facility] [address], [place], [place] [address]  I-STAT 7, (LYTES, BLD GAS, ICA, H+H)     Status: Abnormal   Collection Time: 08/31/19 12:55 PM  Result Value Ref Range   pH, Arterial 7.402 7.350 - 7.450   pCO2 arterial 42.1 32.0 - 48.0 mmHg   pO2, Arterial 77.0 (L) 83.0 - 108.0 mmHg   Bicarbonate 26.3 20.0 - 28.0 mmol/L   TCO2 28 22 - 32 mmol/L   O2 Saturation 95.0 %   Acid-Base Excess 1.0 0.0 - 2.0 mmol/L   Sodium 141 135 - 145 mmol/L   Potassium 3.4 (L) 3.5 - 5.1 mmol/L   Calcium, Ion 1.27 1.15 - 1.40 mmol/L   HCT 39.0 39.0 - 52.0 %   Hemoglobin 13.3 13.0 - 17.0 g/dL   Patient temperature 98.0 F    Collection site RADIAL, ALLEN'S TEST ACCEPTABLE    Drawn by RT    Sample type ARTERIAL   Type and screen [place]     Status: None (Preliminary result)   Collection Time: 08/31/19  1:04 PM  Result Value Ref Range   ABO/RH(D) AB POS    Antibody Screen NEG    Sample Expiration 09/03/2019,2359    Unit Number [id number]    Blood Component Type RED CELLS,LR    Unit division 00    Status of Unit ISSUED    Transfusion Status OK TO TRANSFUSE    Crossmatch Result      Compatible Performed at [facility], [address], [place], [place] [address]    Unit Number [id number]    Blood Component Type RED CELLS,LR    Unit division 00    Status of Unit ALLOCATED    Transfusion Status OK TO TRANSFUSE    Crossmatch Result Compatible   Prepare RBC (crossmatch)     Status: None   Collection Time: 08/31/19  1:04 PM  Result Value Ref Range   Order Confirmation      ORDER PROCESSED BY BLOOD BANK Performed at [facility], [place] [address] [place], [place] [address]   ABO/Rh     Status: None   Collection Time: 08/31/19  1:04 PM  Result Value Ref Range   ABO/RH(D)      AB  POS Performed at [facility], [place] [address] [place], [place] [address]   Hemoglobin A1c     Status: Abnormal   Collection Time: 08/31/19  1:12 PM  Result Value Ref Range   Hgb A1c MFr Bld 6.1 (H) 4.8 - 5.6 %    Comment: (NOTE) Pre diabetes:          5.7%-6.4% Diabetes:              >6.4% Glycemic control for   <7.0% adults with diabetes    Mean Plasma Glucose 128.37 mg/dL    Comment: Performed at [facility] [address], [place], [place] [address]  Comprehensive metabolic panel     Status: Abnormal   Collection Time: 08/31/19  1:12 PM  Result Value Ref Range   Sodium 139 135 - 145 mmol/L   Potassium 4.0 3.5 - 5.1 mmol/L   Chloride 104 98 - 111 mmol/L   CO2 25 22 - 32 mmol/L   Glucose, Bld 98 70 - 99 mg/dL   BUN 13 8 - 23 mg/dL   Creatinine, Ser 1.08 0.61 - 1.24 mg/dL   Calcium 9.3 8.9 - 10.3 mg/dL   Total Protein 5.9 (L) 6.5 - 8.1 g/dL   Albumin 3.8 3.5 - 5.0 g/dL   AST 24 15 - 41 U/L   ALT 19 0 - 44 U/L   Alkaline Phosphatase 35 (L) 38 - 126 U/L   Total Bilirubin 0.7 0.3 - 1.2 mg/dL   GFR calc non Af Amer >60 >60 mL/min   GFR calc Af Amer >60 >60 mL/min   Anion gap 10 5 - 15    Comment: Performed at [facility] [address] [place], [place] [address]  CBC     Status: Abnormal   Collection Time: 08/31/19  1:12 PM  Result Value Ref Range   WBC 4.6 4.0 - 10.5 K/uL   RBC 3.87 (L) 4.22 - 5.81 MIL/uL   Hemoglobin 12.9 (L) 13.0 - 17.0 g/dL   HCT 37.7 (L) 39.0 - 52.0 %   MCV 97.4 80.0 - 100.0 fL   MCH 33.3 26.0 - 34.0 pg   MCHC 34.2 30.0 - 36.0 g/dL   RDW 12.7 11.5 - 15.5 %   Platelets 140 (L) 150 - 400 K/uL    Comment: REPEATED TO VERIFY   nRBC 0.0 0.0 - 0.2 %    Comment: Performed at [facility], [place] [address], [place], [place] [address]  Protime-INR     Status: None    Collection Time: 08/31/19  2:51 PM  Result Value Ref Range   Prothrombin Time 13.7 11.4 - 15.2 seconds   INR 1.1 0.8 - 1.2    Comment: (NOTE) INR goal varies based on device and disease states. Performed at [facility], [place] [address], [place], [place] [address]   APTT     Status: None   Collection Time: 08/31/19  2:51 PM  Result Value Ref Range   aPTT 28 24 - 36 seconds    Comment: Performed at [facility], [place] [address] [place], [place] [address]  CBC     Status: Abnormal   Collection Time: 09/01/19  5:00 AM  Result Value Ref Range   WBC 5.2 4.0 - 10.5 K/uL   RBC 3.64 (L) 4.22 - 5.81 MIL/uL   Hemoglobin 12.3 (L) 13.0 - 17.0 g/dL   HCT 34.9 (L) 39.0 - 52.0 %  MCV 95.9 80.0 - 100.0 fL   MCH 33.8 26.0 - 34.0 pg   MCHC 35.2 30.0 - 36.0 g/dL   RDW 12.8 11.5 - 15.5 %   Platelets 117 (L) 150 - 400 K/uL    Comment: REPEATED TO VERIFY PLATELET COUNT CONFIRMED BY SMEAR SPECIMEN CHECKED FOR CLOTS Immature Platelet Fraction may be clinically indicated, consider ordering this additional test [id number]    nRBC 0.0 0.0 - 0.2 %    Comment: Performed at [facility] [address], [place], [place] [id number]  Basic metabolic panel     Status: None   Collection Time: 09/01/19  5:00 AM  Result Value Ref Range   Sodium 141 135 - 145 mmol/L   Potassium 4.2 3.5 - 5.1 mmol/L   Chloride 107 98 - 111 mmol/L   CO2 25 22 - 32 mmol/L   Glucose, Bld 95 70 - 99 mg/dL   BUN 11 8 - 23 mg/dL   Creatinine, Ser 0.91 0.61 - 1.24 mg/dL   Calcium 9.0 8.9 - 10.3 mg/dL   GFR calc non Af Amer >60 >60 mL/min   GFR calc Af Amer >60 >60 mL/min   Anion gap 9 5 - 15    Comment: Performed at [facility] [address] [place], [place] [address]  I-STAT, [place] 8     Status: Abnormal   Collection Time: 09/01/19  8:15 AM  Result Value Ref Range   Sodium 141 135 - 145 mmol/L   Potassium 3.8 3.5 - 5.1 mmol/L   Chloride 105 98 - 111 mmol/L   BUN 10 8 - 23 mg/dL   Creatinine, Ser  0.70 0.61 - 1.24 mg/dL   Glucose, Bld 100 (H) 70 - 99 mg/dL   Calcium, Ion 1.23 1.15 - 1.40 mmol/L   TCO2 29 22 - 32 mmol/L   Hemoglobin 11.6 (L) 13.0 - 17.0 g/dL   HCT 34.0 (L) 39.0 - 52.0 %  I-STAT, chem 8     Status: Abnormal   Collection Time: 09/01/19  9:26 AM  Result Value Ref Range   Sodium 141 135 - 145 mmol/L   Potassium 4.0 3.5 - 5.1 mmol/L   Chloride 106 98 - 111 mmol/L   BUN 10 8 - 23 mg/dL   Creatinine, Ser 0.70 0.61 - 1.24 mg/dL   Glucose, Bld 112 (H) 70 - 99 mg/dL   Calcium, Ion 1.23 1.15 - 1.40 mmol/L   TCO2 26 22 - 32 mmol/L   Hemoglobin 10.9 (L) 13.0 - 17.0 g/dL   HCT 32.0 (L) 39.0 - 52.0 %  I-STAT 7, (LYTES, BLD GAS, ICA, H+H)     Status: Abnormal   Collection Time: 09/01/19 10:01 AM  Result Value Ref Range   pH, Arterial 7.473 (H) 7.350 - 7.450   pCO2 arterial 36.1 32.0 - 48.0 mmHg   pO2, Arterial 325.0 (H) 83.0 - 108.0 mmHg   Bicarbonate 26.4 20.0 - 28.0 mmol/L   TCO2 28 22 - 32 mmol/L   O2 Saturation 100.0 %   Acid-Base Excess 3.0 (H) 0.0 - 2.0 mmol/L   Sodium 140 135 - 145 mmol/L   Potassium 3.7 3.5 - 5.1 mmol/L   Calcium, Ion 1.05 (L) 1.15 - 1.40 mmol/L   HCT 25.0 (L) 39.0 - 52.0 %   Hemoglobin 8.5 (L) 13.0 - 17.0 g/dL   Patient temperature HIDE    Sample type CARDIOPULMONARY BYPASS   I-STAT, chem 8     Status: Abnormal  Collection Time: 09/01/19 10:29 AM  Result Value Ref Range   Sodium 139 135 - 145 mmol/L   Potassium 4.0 3.5 - 5.1 mmol/L   Chloride 102 98 - 111 mmol/L   BUN 8 8 - 23 mg/dL   Creatinine, Ser 0.60 (L) 0.61 - 1.24 mg/dL   Glucose, Bld 120 (H) 70 - 99 mg/dL   Calcium, Ion 1.07 (L) 1.15 - 1.40 mmol/L   TCO2 28 22 - 32 mmol/L   Hemoglobin 8.5 (L) 13.0 - 17.0 g/dL   HCT 25.0 (L) 39.0 - 52.0 %  Hemoglobin and hematocrit, blood     Status: Abnormal   Collection Time: 09/01/19 10:42 AM  Result Value Ref Range   Hemoglobin 7.7 (L) 13.0 - 17.0 g/dL    Comment: REPEATED TO VERIFY READ BACK AND VERIFIED WITH MARY TICE RN [id number]  [id number] BY MLG    HCT 23.5 (L) 39.0 - 52.0 %    Comment: Performed at [facility] [address], [place], [place] [address]  Platelet count     Status: Abnormal   Collection Time: 09/01/19 10:42 AM  Result Value Ref Range   Platelets 101 (L) 150 - 400 K/uL    Comment: REPEATED TO VERIFY Immature Platelet Fraction may be clinically indicated, consider ordering this additional test [id number] CONSISTENT WITH PREVIOUS RESULT PLATELET COUNT CONFIRMED BY SMEAR Performed at [facility], [place] [address] [place], [place] [address]   I-STAT, [place] 8     Status: Abnormal   Collection Time: 09/01/19 10:49 AM  Result Value Ref Range   Sodium 139 135 - 145 mmol/L   Potassium 4.4 3.5 - 5.1 mmol/L   Chloride 102 98 - 111 mmol/L   BUN 8 8 - 23 mg/dL   Creatinine, Ser 0.60 (L) 0.61 - 1.24 mg/dL   Glucose, Bld 120 (H) 70 - 99 mg/dL   Calcium, Ion 1.05 (L) 1.15 - 1.40 mmol/L   TCO2 25 22 - 32 mmol/L   Hemoglobin 7.8 (L) 13.0 - 17.0 g/dL   HCT 23.0 (L) 39.0 - 52.0 %  I-STAT, chem 8     Status: Abnormal   Collection Time: 09/01/19 12:21 PM  Result Value Ref Range   Sodium 140 135 - 145 mmol/L   Potassium 3.6 3.5 - 5.1 mmol/L   Chloride 105 98 - 111 mmol/L   BUN 7 (L) 8 - 23 mg/dL   Creatinine, Ser 0.60 (L) 0.61 - 1.24 mg/dL   Glucose, Bld 124 (H) 70 - 99 mg/dL   Calcium, Ion 1.26 1.15 - 1.40 mmol/L   TCO2 25 22 - 32 mmol/L   Hemoglobin 7.1 (L) 13.0 - 17.0 g/dL   HCT 21.0 (L) 39.0 - 52.0 %  I-STAT 7, (LYTES, BLD GAS, ICA, H+H)     Status: Abnormal   Collection Time: 09/01/19  1:59 PM  Result Value Ref Range   pH, Arterial 7.426 7.350 - 7.450   pCO2 arterial 35.5 32.0 - 48.0 mmHg   pO2, Arterial 155.0 (H) 83.0 - 108.0 mmHg   Bicarbonate 23.7 20.0 - 28.0 mmol/L   TCO2 25 22 - 32 mmol/L   O2 Saturation 99.0 %   Acid-base deficit 1.0 0.0 - 2.0 mmol/L   Sodium 142 135 - 145 mmol/L   Potassium 3.7 3.5 - 5.1 mmol/L   Calcium, Ion 1.17 1.15 - 1.40 mmol/L   HCT 26.0 (L) 39.0 -  52.0 %   Hemoglobin 8.8 (L) 13.0 - 17.0 g/dL  Patient temperature 35.4 C    Sample type CARDIOPULMONARY BYPASS   CBC     Status: Abnormal   Collection Time: 09/01/19  2:00 PM  Result Value Ref Range   WBC 9.9 4.0 - 10.5 K/uL   RBC 2.65 (L) 4.22 - 5.81 MIL/uL   Hemoglobin 8.8 (L) 13.0 - 17.0 g/dL    Comment: REPEATED TO VERIFY   HCT 26.2 (L) 39.0 - 52.0 %   MCV 98.9 80.0 - 100.0 fL   MCH 33.2 26.0 - 34.0 pg   MCHC 33.6 30.0 - 36.0 g/dL   RDW 12.8 11.5 - 15.5 %   Platelets 100 (L) 150 - 400 K/uL    Comment: REPEATED TO VERIFY Immature Platelet Fraction may be clinically indicated, consider ordering this additional test [id number] CONSISTENT WITH PREVIOUS RESULT    nRBC 0.0 0.0 - 0.2 %    Comment: Performed at [facility], [place] [address] [place], [place] [address]  Protime-INR     Status: Abnormal   Collection Time: 09/01/19  2:00 PM  Result Value Ref Range   Prothrombin Time 17.8 (H) 11.4 - 15.2 seconds   INR 1.5 (H) 0.8 - 1.2    Comment: (NOTE) INR goal varies based on device and disease states. Performed at [facility], [place] [address] [place], [place] [address]   APTT     Status: None   Collection Time: 09/01/19  2:00 PM  Result Value Ref Range   aPTT 29 24 - 36 seconds    Comment: Performed at [facility] [address], [place], [place] [address]  Glucose, capillary     Status: None   Collection Time: 09/01/19  2:06 PM  Result Value Ref Range   Glucose-Capillary 88 70 - 99 mg/dL  Glucose, capillary     Status: None   Collection Time: 09/01/19  2:51 PM  Result Value Ref Range   Glucose-Capillary 80 70 - 99 mg/dL  Glucose, capillary     Status: Abnormal   Collection Time: 09/01/19  3:50 PM  Result Value Ref Range   Glucose-Capillary 129 (H) 70 - 99 mg/dL  I-STAT 7, (LYTES, BLD GAS, ICA, H+H)     Status: Abnormal   Collection Time: 09/01/19  4:50 PM  Result Value Ref Range   pH, Arterial 7.314 (L) 7.350 - 7.450   pCO2 arterial 39.4 32.0 -  48.0 mmHg   pO2, Arterial 86.0 83.0 - 108.0 mmHg   Bicarbonate 20.2 20.0 - 28.0 mmol/L   TCO2 21 (L) 22 - 32 mmol/L   O2 Saturation 96.0 %   Acid-base deficit 6.0 (H) 0.0 - 2.0 mmol/L   Sodium 142 135 - 145 mmol/L   Potassium 4.3 3.5 - 5.1 mmol/L   Calcium, Ion 1.19 1.15 - 1.40 mmol/L   HCT 27.0 (L) 39.0 - 52.0 %   Hemoglobin 9.2 (L) 13.0 - 17.0 g/dL   Patient temperature 36.0 C    Sample type CARDIOPULMONARY BYPASS   Glucose, capillary     Status: Abnormal   Collection Time: 09/01/19  4:58 PM  Result Value Ref Range   Glucose-Capillary 147 (H) 70 - 99 mg/dL  Glucose, capillary     Status: Abnormal   Collection Time: 09/01/19  6:05 PM  Result Value Ref Range   Glucose-Capillary 139 (H) 70 - 99 mg/dL  I-STAT 7, (LYTES, BLD GAS, ICA, H+H)     Status: Abnormal   Collection Time: 09/01/19  6:06 PM  Result Value Ref Range   pH, Arterial 7.314 (L) 7.350 - 7.450   pCO2 arterial 37.8 32.0 - 48.0 mmHg   pO2, Arterial 90.0 83.0 - 108.0 mmHg   Bicarbonate 19.4 (L) 20.0 - 28.0 mmol/L   TCO2 21 (L) 22 - 32 mmol/L   O2 Saturation 97.0 %   Acid-base deficit 6.0 (H) 0.0 - 2.0 mmol/L   Sodium 141 135 - 145 mmol/L   Potassium 4.5 3.5 - 5.1 mmol/L   Calcium, Ion 1.18 1.15 - 1.40 mmol/L   HCT 26.0 (L) 39.0 - 52.0 %   Hemoglobin 8.8 (L) 13.0 - 17.0 g/dL   Patient temperature 36.4 C    Sample type ARTERIAL   Prepare RBC     Status: None   Collection Time: 09/01/19  6:10 PM  Result Value Ref Range   Order Confirmation      ORDER PROCESSED BY BLOOD BANK Performed at [facility], [address] [place], [place] [address]   Glucose, capillary     Status: Abnormal   Collection Time: 09/01/19  7:00 PM  Result Value Ref Range   Glucose-Capillary 130 (H) 70 - 99 mg/dL  CBC     Status: Abnormal   Collection Time: 09/01/19  7:05 PM  Result Value Ref Range   WBC 11.9 (H) 4.0 - 10.5 K/uL   RBC 2.65 (L) 4.22 - 5.81 MIL/uL   Hemoglobin 8.8 (L) 13.0 - 17.0 g/dL   HCT 27.0 (L) 39.0 - 52.0 %    MCV 101.9 (H) 80.0 - 100.0 fL   MCH 33.2 26.0 - 34.0 pg   MCHC 32.6 30.0 - 36.0 g/dL   RDW 12.9 11.5 - 15.5 %   Platelets 119 (L) 150 - 400 K/uL    Comment: REPEATED TO VERIFY PLATELET COUNT CONFIRMED BY SMEAR Immature Platelet Fraction may be clinically indicated, consider ordering this additional test [id number]    nRBC 0.0 0.0 - 0.2 %    Comment: Performed at [facility], [place] [address], [place], [place] [address]  Magnesium     Status: Abnormal   Collection Time: 09/01/19  7:05 PM  Result Value Ref Range   Magnesium 3.2 (H) 1.7 - 2.4 mg/dL    Comment: Performed at [facility] [address], [place], [place] [id number]  Basic metabolic panel     Status: Abnormal   Collection Time: 09/01/19  7:05 PM  Result Value Ref Range   Sodium 139 135 - 145 mmol/L   Potassium 4.3 3.5 - 5.1 mmol/L   Chloride 110 98 - 111 mmol/L   CO2 19 (L) 22 - 32 mmol/L   Glucose, Bld 130 (H) 70 - 99 mg/dL   BUN 9 8 - 23 mg/dL   Creatinine, Ser 1.01 0.61 - 1.24 mg/dL   Calcium 8.0 (L) 8.9 - 10.3 mg/dL   GFR calc non Af Amer >60 >60 mL/min   GFR calc Af Amer >60 >60 mL/min   Anion gap 10 5 - 15    Comment: Performed at [facility], [place] [address] [place], [place] [address]  Glucose, capillary     Status: Abnormal   Collection Time: 09/01/19  8:01 PM  Result Value Ref Range   Glucose-Capillary 113 (H) 70 - 99 mg/dL  Glucose, capillary     Status: Abnormal   Collection Time: 09/01/19  9:04 PM  Result Value Ref Range   Glucose-Capillary 131 (H) 70 - 99 mg/dL  Glucose,  capillary     Status: Abnormal   Collection Time: 09/01/19 10:10 PM  Result Value Ref Range   Glucose-Capillary 120 (H) 70 - 99 mg/dL  Glucose, capillary     Status: Abnormal   Collection Time: 09/01/19 11:13 PM  Result Value Ref Range   Glucose-Capillary 100 (H) 70 - 99 mg/dL  Glucose, capillary     Status: Abnormal   Collection Time: 09/02/19 12:10 AM  Result Value Ref Range   Glucose-Capillary 100 (H) 70  - 99 mg/dL  Glucose, capillary     Status: Abnormal   Collection Time: 09/02/19  1:13 AM  Result Value Ref Range   Glucose-Capillary 111 (H) 70 - 99 mg/dL  CBC     Status: Abnormal   Collection Time: 09/02/19  3:54 AM  Result Value Ref Range   WBC 9.5 4.0 - 10.5 K/uL   RBC 2.68 (L) 4.22 - 5.81 MIL/uL   Hemoglobin 8.9 (L) 13.0 - 17.0 g/dL   HCT 26.8 (L) 39.0 - 52.0 %   MCV 100.0 80.0 - 100.0 fL   MCH 33.2 26.0 - 34.0 pg   MCHC 33.2 30.0 - 36.0 g/dL   RDW 13.3 11.5 - 15.5 %   Platelets 101 (L) 150 - 400 K/uL    Comment: Immature Platelet Fraction may be clinically indicated, consider ordering this additional test [id number] CONSISTENT WITH PREVIOUS RESULT    nRBC 0.0 0.0 - 0.2 %    Comment: Performed at [facility], [place] [address] [place], [place] [id number]  Basic metabolic panel     Status: Abnormal   Collection Time: 09/02/19  3:54 AM  Result Value Ref Range   Sodium 140 135 - 145 mmol/L   Potassium 3.9 3.5 - 5.1 mmol/L   Chloride 112 (H) 98 - 111 mmol/L   CO2 17 (L) 22 - 32 mmol/L   Glucose, Bld 131 (H) 70 - 99 mg/dL   BUN 10 8 - 23 mg/dL   Creatinine, Ser 1.02 0.61 - 1.24 mg/dL   Calcium 7.8 (L) 8.9 - 10.3 mg/dL   GFR calc non Af Amer >60 >60 mL/min   GFR calc Af Amer >60 >60 mL/min   Anion gap 11 5 - 15    Comment: Performed at [facility], [place] [address], [place], [place] [address]  Magnesium     Status: Abnormal   Collection Time: 09/02/19  3:54 AM  Result Value Ref Range   Magnesium 2.6 (H) 1.7 - 2.4 mg/dL    Comment: Performed at [facility] [address], [place], [place] [address]  Glucose, capillary     Status: Abnormal   Collection Time: 09/02/19  3:59 AM  Result Value Ref Range   Glucose-Capillary 127 (H) 70 - 99 mg/dL  Glucose, capillary     Status: Abnormal   Collection Time: 09/02/19  7:43 AM  Result Value Ref Range   Glucose-Capillary 153 (H) 70 - 99 mg/dL    ECG   NSR at 78, low voltage QRS - Personally Reviewed   Telemetry   Sinus with intermittent pacing - Personally Reviewed  Radiology    Dg Chest Port 1 View  Result Date: 09/01/2019 CLINICAL DATA:  Status post CABG. EXAM: PORTABLE CHEST 1 VIEW COMPARISON:  Chest x-ray from yesterday. FINDINGS: Endotracheal tube in position with the tip 1.6 cm above the level of the carina. Enteric tube entering the stomach with the tip below the field of view. Right internal jugular  Swan-Ganz catheter with the tip in the right pulmonary artery. Mediastinal and left chest tubes are in good position. Interval CABG. Normal heart size. Low lung volumes with bronchovascular crowding. Bibasilar atelectasis. Possible layering small right pleural effusion. No pneumothorax. No acute osseous abnormality. IMPRESSION: 1. Status post CABG with appropriately positioned support tubes and apparatus. 2. Low lung volumes. Possible layering small right pleural effusion. Electronically Signed   By: Titus Dubin M.D.   On: 09/01/2019 13:45   Dg Chest Port 1 View  Result Date: 08/31/2019 CLINICAL DATA:  Preop evaluation for upcoming heart surgery, shortness of breath EXAM: PORTABLE CHEST 1 VIEW COMPARISON:  None. FINDINGS: The heart size and mediastinal contours are within normal limits. Both lungs are clear. The visualized skeletal structures are unremarkable. IMPRESSION: No active disease. Electronically Signed   By: Inez Catalina M.D.   On: 08/31/2019 12:43   Vas US Doppler Pre Cabg  Result Date: 08/31/2019 PREOPERATIVE VASCULAR EVALUATION  Indications:      Pre-CABG. Risk Factors:     Coronary artery disease. Comparison Study: No prior studies. Performing Technologist: Carlos Levering Rvt  Examination Guidelines: A complete evaluation includes B-mode imaging, spectral Doppler, color Doppler, and power Doppler as needed of all accessible portions of each vessel. Bilateral testing is considered an integral part of a complete examination. Limited examinations for reoccurring indications  may be performed as noted.  Right Carotid Findings: +----------+--------+--------+--------+-----------------------+--------+           PSV cm/sEDV cm/sStenosisDescribe               Comments +----------+--------+--------+--------+-----------------------+--------+ CCA Prox  63      17              smooth and heterogenous         +----------+--------+--------+--------+-----------------------+--------+ CCA Distal59      18              smooth and heterogenous         +----------+--------+--------+--------+-----------------------+--------+ ICA Prox  48      16              smooth and heterogenous         +----------+--------+--------+--------+-----------------------+--------+ ICA Distal60      28                                     tortuous +----------+--------+--------+--------+-----------------------+--------+ ECA       58      10                                              +----------+--------+--------+--------+-----------------------+--------+ Portions of this table do not appear on this page. +----------+--------+-------+--------+------------+           PSV cm/sEDV cmsDescribeArm Pressure +----------+--------+-------+--------+------------+ Subclavian64                                  +----------+--------+-------+--------+------------+ +---------+--------+--+--------+--+---------+ VertebralPSV cm/s48EDV cm/s17Antegrade +---------+--------+--+--------+--+---------+ Left Carotid Findings: +----------+--------+--------+--------+-----------------------+--------+           PSV cm/sEDV cm/sStenosisDescribe               Comments +----------+--------+--------+--------+-----------------------+--------+ CCA Prox  83      22  smooth and heterogenous         +----------+--------+--------+--------+-----------------------+--------+ CCA Distal64      22              smooth                 tortuous  +----------+--------+--------+--------+-----------------------+--------+ ICA Prox  89      39              smooth and heterogenoustortuous +----------+--------+--------+--------+-----------------------+--------+ ICA Distal121     49                                     tortuous +----------+--------+--------+--------+-----------------------+--------+ ECA       61      11                                              +----------+--------+--------+--------+-----------------------+--------+ +----------+--------+--------+--------+------------+ SubclavianPSV cm/sEDV cm/sDescribeArm Pressure +----------+--------+--------+--------+------------+           78                      118          +----------+--------+--------+--------+------------+ +---------+--------+--+--------+--+---------+ VertebralPSV cm/s25EDV cm/s10Antegrade +---------+--------+--+--------+--+---------+  ABI Findings: +--------+------------------+-----+---------+--------+ Right   Rt Pressure (mmHg)IndexWaveform Comment  +--------+------------------+-----+---------+--------+ Brachial                                TR band  +--------+------------------+-----+---------+--------+ PTA     140               1.19 triphasic         +--------+------------------+-----+---------+--------+ DP      142               1.20 triphasic         +--------+------------------+-----+---------+--------+ +--------+------------------+-----+---------+-------+ Left    Lt Pressure (mmHg)IndexWaveform Comment +--------+------------------+-----+---------+-------+ [id number]                    triphasic        +--------+------------------+-----+---------+-------+ PTA     144               1.22 triphasic        +--------+------------------+-----+---------+-------+ DP      140               1.19 triphasic        +--------+------------------+-----+---------+-------+  Right Doppler Findings:  +--------+--------+-----+-------+--------+ Site    PressureIndexDopplerComments +--------+--------+-----+-------+--------+ Brachial                    TR band  +--------+--------+-----+-------+--------+  Left Doppler Findings: +-----------+--------+-----+---------+-----------------------------------------+ Site       PressureIndexDoppler  Comments                                  +-----------+--------+-----+---------+-----------------------------------------+ Brachial   118          triphasic                                          +-----------+--------+-----+---------+-----------------------------------------+ Radial  triphasic                                          +-----------+--------+-----+---------+-----------------------------------------+ Ulnar                   triphasic                                          +-----------+--------+-----+---------+-----------------------------------------+ Palmar Arch                      Palmar waveforms are obliterated with                                      radial and ulnar compression.             +-----------+--------+-----+---------+-----------------------------------------+  Summary: Right Carotid: Velocities in the right ICA are consistent with a 1-39% stenosis. Left Carotid: Velocities in the left ICA are consistent with a 1-39% stenosis. Vertebrals: Bilateral vertebral arteries demonstrate antegrade flow. Right ABI: Resting right ankle-brachial index is within normal range. No evidence of significant right lower extremity arterial disease. Left ABI: Resting left ankle-brachial index is within normal range. No evidence of significant left lower extremity arterial disease.  Electronically signed by Servando Snare MD on 08/31/2019 at 2:15:51 PM.    Final     Cardiac Studies   N/A  Assessment   Principal Problem:   Coronary artery disease involving native coronary artery of native heart with  unstable angina pectoris ([place]) Active Problems:   Familial hyperlipidemia   Unstable angina pectoris ([place])   S/P CABG x 4   Essential hypertension   Plan   1. CAD s/p CABG x 4 - recovering POD #1, no complaints. Requiring neo - likely can wean given MAP's in the 70's. Will likely need diuresis once off pressors. Resume home BP meds as tolerated. On aspirin. 2. 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 3. Hypertension - was on telmisartan at home, restart as BP allows once off pressors.  Time Spent Directly with Patient:  I have spent a total 35 minutes with the patient reviewing hospital notes, telemetry, EKGs, labs an examining the patient as well as establishing an assessment and plan that was discussed personally with the patient.  > 50% of time was spent in direct patient care.  Length of Stay:  LOS: 2 days   [name], MD, [facility], [place] Director of the Advanced Lipid Disorders &  Cardiovascular [facility] Clinic Diplomate of the American Board of Clinical Lipidology Attending Cardiologist  Direct Dial: [phone]  Fax: [phone]  Website:  [email or web address]  09/02/2019, 8:44 AM

## 2019-09-03 ENCOUNTER — Inpatient Hospital Stay (HOSPITAL_COMMUNITY): Payer: 59

## 2019-09-03 DIAGNOSIS — E7849 Other hyperlipidemia: Secondary | ICD-10-CM | POA: Diagnosis not present

## 2019-09-03 DIAGNOSIS — Z951 Presence of aortocoronary bypass graft: Secondary | ICD-10-CM | POA: Diagnosis not present

## 2019-09-03 DIAGNOSIS — I25110 Atherosclerotic heart disease of native coronary artery with unstable angina pectoris: Secondary | ICD-10-CM | POA: Diagnosis not present

## 2019-09-03 DIAGNOSIS — I10 Essential (primary) hypertension: Secondary | ICD-10-CM | POA: Diagnosis not present

## 2019-09-03 DIAGNOSIS — R710 Precipitous drop in hematocrit: Secondary | ICD-10-CM

## 2019-09-03 DIAGNOSIS — Z9889 Other specified postprocedural states: Secondary | ICD-10-CM

## 2019-09-03 LAB — CBC WITH DIFFERENTIAL/PLATELET
Abs Immature Granulocytes: 0.03 10*3/uL (ref 0.00–0.07)
Basophils Absolute: 0 10*3/uL (ref 0.0–0.1)
Basophils Relative: 0 %
Eosinophils Absolute: 0 10*3/uL (ref 0.0–0.5)
Eosinophils Relative: 1 %
HCT: 22 % — ABNORMAL LOW (ref 39.0–52.0)
Hemoglobin: 7.2 g/dL — ABNORMAL LOW (ref 13.0–17.0)
Immature Granulocytes: 0 %
Lymphocytes Relative: 18 %
Lymphs Abs: 1.6 10*3/uL (ref 0.7–4.0)
MCH: 32.9 pg (ref 26.0–34.0)
MCHC: 32.7 g/dL (ref 30.0–36.0)
MCV: 100.5 fL — ABNORMAL HIGH (ref 80.0–100.0)
Monocytes Absolute: 1.2 10*3/uL — ABNORMAL HIGH (ref 0.1–1.0)
Monocytes Relative: 14 %
Neutro Abs: 5.9 10*3/uL (ref 1.7–7.7)
Neutrophils Relative %: 67 %
Platelets: 89 10*3/uL — ABNORMAL LOW (ref 150–400)
RBC: 2.19 MIL/uL — ABNORMAL LOW (ref 4.22–5.81)
RDW: 13.5 % (ref 11.5–15.5)
WBC: 8.7 10*3/uL (ref 4.0–10.5)
nRBC: 0 % (ref 0.0–0.2)

## 2019-09-03 LAB — BASIC METABOLIC PANEL
Anion gap: 5 (ref 5–15)
BUN: 11 mg/dL (ref 8–23)
CO2: 26 mmol/L (ref 22–32)
Calcium: 7.8 mg/dL — ABNORMAL LOW (ref 8.9–10.3)
Chloride: 105 mmol/L (ref 98–111)
Creatinine, Ser: 0.94 mg/dL (ref 0.61–1.24)
GFR calc Af Amer: >60 mL/min (ref >60)
GFR calc non Af Amer: >60 mL/min (ref >60)
Glucose, Bld: 109 mg/dL — ABNORMAL HIGH (ref 70–99)
Potassium: 3.7 mmol/L (ref 3.5–5.1)
Sodium: 136 mmol/L (ref 135–145)

## 2019-09-03 LAB — GLUCOSE, CAPILLARY
Glucose-Capillary: 104 mg/dL — ABNORMAL HIGH (ref 70–99)
Glucose-Capillary: 104 mg/dL — ABNORMAL HIGH (ref 70–99)
Glucose-Capillary: 86 mg/dL (ref 70–99)
Glucose-Capillary: 82 mg/dL (ref 70–99)
Glucose-Capillary: 103 mg/dL — ABNORMAL HIGH (ref 70–99)

## 2019-09-03 MED ORDER — MIDODRINE HCL 5 MG PO TABS
10.0000 mg | ORAL_TABLET | Freq: Three times a day (TID) | ORAL | Status: DC
  Administered 2019-09-04 – 2019-09-07 (×10): 10 mg via ORAL
  Filled 2019-09-03 (×13): qty 2

## 2019-09-03 MED ORDER — PHENOL 1.4 % MT LIQD
1.0000 | OROMUCOSAL | Status: DC | PRN
  Administered 2019-09-03: 1 via OROMUCOSAL
  Filled 2019-09-03: qty 177

## 2019-09-03 MED ORDER — FUROSEMIDE 10 MG/ML IJ SOLN
40.0000 mg | Freq: Two times a day (BID) | INTRAMUSCULAR | Status: DC
  Administered 2019-09-03 – 2019-09-04 (×3): 40 mg via INTRAVENOUS
  Filled 2019-09-03 (×3): qty 4

## 2019-09-03 MED ORDER — MIDODRINE HCL 5 MG PO TABS
5.0000 mg | ORAL_TABLET | Freq: Two times a day (BID) | ORAL | Status: DC
  Administered 2019-09-03 (×2): 5 mg via ORAL
  Filled 2019-09-03 (×2): qty 1

## 2019-09-03 MED ORDER — POTASSIUM CHLORIDE CRYS ER 20 MEQ PO TBCR
20.0000 meq | EXTENDED_RELEASE_TABLET | ORAL | Status: AC
  Administered 2019-09-03 (×3): 20 meq via ORAL
  Filled 2019-09-03 (×3): qty 1

## 2019-09-03 NOTE — Progress Notes (Signed)
DAILY PROGRESS NOTE   Patient Name: [name] [name] Date of Encounter: 09/03/2019 Cardiologist: No primary care provider on file.  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  Subjective    POD #2 s/p CABG. Remains hypotensive - plan to possibly add midodrine to wean neo. Creatinine improved to 0.94. H/H lower at 7.2/22 - ?transfuse. Weight continues to climb - 106 kg, up form 97 kg on 10/22 - he is recorded +4L.  Objective   Vitals:   09/03/19 0630 09/03/19 0645 09/03/19 0700 09/03/19 0803  BP: (!) 86/53 97/62 (!) 93/57   Pulse: 78 73 74   Resp: (!) 23 (!) 28 14   Temp:    98.5 F (36.9 C)  TempSrc:    Oral  SpO2: 97% 99% 97%   Weight:      Height:        Intake/Output Summary (Last 24 hours) at 09/03/2019 0849 Last data filed at 09/03/2019 0600 Gross per 24 hour  Intake 1423.05 ml  Output 920 ml  Net 503.05 ml   Filed Weights   08/31/19 0600 09/02/19 0600 09/03/19 0500  Weight: 97.1 kg 104.4 kg 106.7 kg    Physical Exam   General appearance: alert and no distress Neck: JVD - a few cm above sternal notch, no carotid bruit and thyroid not enlarged, symmetric, no tenderness/mass/nodules Lungs: diminished breath sounds bibasilar Heart: regular rate and rhythm Abdomen: soft, non-tender; bowel sounds normal; no masses,  no organomegaly and protuberant Extremities: edema 2+ edema Pulses: 2+ and symmetric Skin: pale, cool, dry Neurologic: Grossly normal Psych: Pleasant  Inpatient Medications    Scheduled Meds: . acetaminophen  1,000 mg Oral Q6H   Or  . acetaminophen (TYLENOL) oral liquid 160 mg/5 mL  1,000 mg Per Tube Q6H  . aspirin EC  325 mg Oral Daily   Or  . aspirin  324 mg Per Tube Daily  . bisacodyl  10 mg Oral Daily   Or  . bisacodyl  10 mg Rectal Daily  . Chlorhexidine  Gluconate Cloth  6 each Topical Daily  . docusate sodium  200 mg Oral Daily  . ezetimibe  10 mg Oral Daily  . furosemide  40 mg Intravenous BID  . insulin aspart  0-24 Units Subcutaneous Q4H  . metoprolol tartrate  12.5 mg Oral BID   Or  . metoprolol tartrate  12.5 mg Per Tube BID  . midodrine  5 mg Oral BID WC  . pantoprazole  40 mg Oral Daily  . potassium chloride  20 mEq Oral Q4H  . rosuvastatin  40 mg Oral QPM  . sodium chloride flush  10-40 mL Intracatheter Q12H  . sodium chloride flush  3 mL Intravenous Q12H    Continuous Infusions: . sodium chloride Stopped (09/02/19 1232)  . sodium chloride    . sodium chloride 20 mL/hr at 09/01/19 1325  . cefUROXime (ZINACEF)  IV 1.5 g (09/03/19 0824)  . lactated ringers    . lactated ringers    . lactated ringers 10 mL/hr at 09/03/19 0600  . nitroGLYCERIN Stopped (09/01/19 1329)  . phenylephrine (NEO-SYNEPHRINE) Adult infusion 25 mcg/min (09/03/19 0600)    PRN Meds: sodium chloride, lactated ringers, metoprolol tartrate, morphine injection, ondansetron (ZOFRAN) IV, oxyCODONE, sodium chloride flush, sodium chloride flush, traMADol  Labs   Results for orders placed or performed during the hospital encounter of 08/31/19 (from the past 48 hour(s))  I-STAT, chem 8     Status: Abnormal   Collection Time: 09/01/19  9:26 AM  Result Value Ref Range   Sodium 141 135 - 145 mmol/L   Potassium 4.0 3.5 - 5.1 mmol/L   Chloride 106 98 - 111 mmol/L   BUN 10 8 - 23 mg/dL   Creatinine, Ser 0.70 0.61 - 1.24 mg/dL   Glucose, Bld 112 (H) 70 - 99 mg/dL   Calcium, Ion 1.23 1.15 - 1.40 mmol/L   TCO2 26 22 - 32 mmol/L   Hemoglobin 10.9 (L) 13.0 - 17.0 g/dL   HCT 32.0 (L) 39.0 - 52.0 %  I-STAT 7, (LYTES, BLD GAS, ICA, H+H)     Status: Abnormal   Collection Time: 09/01/19 10:01 AM  Result Value Ref Range   pH, Arterial 7.473 (H) 7.350 - 7.450   pCO2 arterial 36.1 32.0 - 48.0 mmHg   pO2, Arterial 325.0 (H) 83.0 - 108.0 mmHg   Bicarbonate 26.4 20.0  - 28.0 mmol/L   TCO2 28 22 - 32 mmol/L   O2 Saturation 100.0 %   Acid-Base Excess 3.0 (H) 0.0 - 2.0 mmol/L   Sodium 140 135 - 145 mmol/L   Potassium 3.7 3.5 - 5.1 mmol/L   Calcium, Ion 1.05 (L) 1.15 - 1.40 mmol/L   HCT 25.0 (L) 39.0 - 52.0 %   Hemoglobin 8.5 (L) 13.0 - 17.0 g/dL   Patient temperature HIDE    Sample type CARDIOPULMONARY BYPASS   I-STAT, chem 8     Status: Abnormal   Collection Time: 09/01/19 10:29 AM  Result Value Ref Range   Sodium 139 135 - 145 mmol/L   Potassium 4.0 3.5 - 5.1 mmol/L   Chloride 102 98 - 111 mmol/L   BUN 8 8 - 23 mg/dL   Creatinine, Ser 0.60 (L) 0.61 - 1.24 mg/dL   Glucose, Bld 120 (H) 70 - 99 mg/dL   Calcium, Ion 1.07 (L) 1.15 - 1.40 mmol/L   TCO2 28 22 - 32 mmol/L   Hemoglobin 8.5 (L) 13.0 - 17.0 g/dL   HCT 25.0 (L) 39.0 - 52.0 %  Hemoglobin and hematocrit, blood     Status: Abnormal   Collection Time: 09/01/19 10:42 AM  Result Value Ref Range   Hemoglobin 7.7 (L) 13.0 - 17.0 g/dL    Comment: REPEATED TO VERIFY READ BACK AND VERIFIED WITH MARY TICE RN [id number] BY MLG    HCT 23.5 (L) 39.0 - 52.0 %    Comment: Performed at [facility], [address] [place], [place] [address]  Platelet count     Status: Abnormal   Collection Time: 09/01/19 10:42 AM  Result Value Ref Range   Platelets 101 (L) 150 - 400 K/uL    Comment: REPEATED TO VERIFY Immature Platelet Fraction may be clinically indicated, consider ordering this additional test [id number] CONSISTENT WITH PREVIOUS RESULT PLATELET COUNT CONFIRMED BY SMEAR Performed at [facility], [place] [address], [place], [place] [address]   I-STAT, [place] 8     Status: Abnormal   Collection Time: 09/01/19 10:49 AM  Result Value Ref Range   Sodium 139 135 - 145 mmol/L   Potassium 4.4 3.5 - 5.1 mmol/L   Chloride 102 98 - 111 mmol/L   BUN 8 8 - 23 mg/dL   Creatinine, Ser 0.60 (L) 0.61 - 1.24 mg/dL  Glucose, Bld 120 (H) 70 - 99 mg/dL   Calcium, Ion 1.05 (L) 1.15 - 1.40 mmol/L    TCO2 25 22 - 32 mmol/L   Hemoglobin 7.8 (L) 13.0 - 17.0 g/dL   HCT 23.0 (L) 39.0 - 52.0 %  I-STAT, chem 8     Status: Abnormal   Collection Time: 09/01/19 12:21 PM  Result Value Ref Range   Sodium 140 135 - 145 mmol/L   Potassium 3.6 3.5 - 5.1 mmol/L   Chloride 105 98 - 111 mmol/L   BUN 7 (L) 8 - 23 mg/dL   Creatinine, Ser 0.60 (L) 0.61 - 1.24 mg/dL   Glucose, Bld 124 (H) 70 - 99 mg/dL   Calcium, Ion 1.26 1.15 - 1.40 mmol/L   TCO2 25 22 - 32 mmol/L   Hemoglobin 7.1 (L) 13.0 - 17.0 g/dL   HCT 21.0 (L) 39.0 - 52.0 %  I-STAT 7, (LYTES, BLD GAS, ICA, H+H)     Status: Abnormal   Collection Time: 09/01/19  1:59 PM  Result Value Ref Range   pH, Arterial 7.426 7.350 - 7.450   pCO2 arterial 35.5 32.0 - 48.0 mmHg   pO2, Arterial 155.0 (H) 83.0 - 108.0 mmHg   Bicarbonate 23.7 20.0 - 28.0 mmol/L   TCO2 25 22 - 32 mmol/L   O2 Saturation 99.0 %   Acid-base deficit 1.0 0.0 - 2.0 mmol/L   Sodium 142 135 - 145 mmol/L   Potassium 3.7 3.5 - 5.1 mmol/L   Calcium, Ion 1.17 1.15 - 1.40 mmol/L   HCT 26.0 (L) 39.0 - 52.0 %   Hemoglobin 8.8 (L) 13.0 - 17.0 g/dL   Patient temperature 35.4 C    Sample type CARDIOPULMONARY BYPASS   CBC     Status: Abnormal   Collection Time: 09/01/19  2:00 PM  Result Value Ref Range   WBC 9.9 4.0 - 10.5 K/uL   RBC 2.65 (L) 4.22 - 5.81 MIL/uL   Hemoglobin 8.8 (L) 13.0 - 17.0 g/dL    Comment: REPEATED TO VERIFY   HCT 26.2 (L) 39.0 - 52.0 %   MCV 98.9 80.0 - 100.0 fL   MCH 33.2 26.0 - 34.0 pg   MCHC 33.6 30.0 - 36.0 g/dL   RDW 12.8 11.5 - 15.5 %   Platelets 100 (L) 150 - 400 K/uL    Comment: REPEATED TO VERIFY Immature Platelet Fraction may be clinically indicated, consider ordering this additional test [id number] CONSISTENT WITH PREVIOUS RESULT    nRBC 0.0 0.0 - 0.2 %    Comment: Performed at [facility], [place] [address], [place], [place] [address]  Protime-INR     Status: Abnormal   Collection Time: 09/01/19  2:00 PM  Result Value Ref Range    Prothrombin Time 17.8 (H) 11.4 - 15.2 seconds   INR 1.5 (H) 0.8 - 1.2    Comment: (NOTE) INR goal varies based on device and disease states. Performed at [facility], [place] [address] [place] [address], [place], [place] [address]   APTT     Status: None   Collection Time: 09/01/19  2:00 PM  Result Value Ref Range   aPTT 29 24 - 36 seconds    Comment: Performed at [facility] [address] [place], [place] [address]  Glucose, capillary     Status: None   Collection Time: 09/01/19  2:06 PM  Result Value Ref Range   Glucose-Capillary 88 70 - 99 mg/dL  Glucose, capillary  Status: None   Collection Time: 09/01/19  2:51 PM  Result Value Ref Range   Glucose-Capillary 80 70 - 99 mg/dL  Glucose, capillary     Status: Abnormal   Collection Time: 09/01/19  3:50 PM  Result Value Ref Range   Glucose-Capillary 129 (H) 70 - 99 mg/dL  I-STAT 7, (LYTES, BLD GAS, ICA, H+H)     Status: Abnormal   Collection Time: 09/01/19  4:50 PM  Result Value Ref Range   pH, Arterial 7.314 (L) 7.350 - 7.450   pCO2 arterial 39.4 32.0 - 48.0 mmHg   pO2, Arterial 86.0 83.0 - 108.0 mmHg   Bicarbonate 20.2 20.0 - 28.0 mmol/L   TCO2 21 (L) 22 - 32 mmol/L   O2 Saturation 96.0 %   Acid-base deficit 6.0 (H) 0.0 - 2.0 mmol/L   Sodium 142 135 - 145 mmol/L   Potassium 4.3 3.5 - 5.1 mmol/L   Calcium, Ion 1.19 1.15 - 1.40 mmol/L   HCT 27.0 (L) 39.0 - 52.0 %   Hemoglobin 9.2 (L) 13.0 - 17.0 g/dL   Patient temperature 36.0 C    Sample type CARDIOPULMONARY BYPASS   Glucose, capillary     Status: Abnormal   Collection Time: 09/01/19  4:58 PM  Result Value Ref Range   Glucose-Capillary 147 (H) 70 - 99 mg/dL  Glucose, capillary     Status: Abnormal   Collection Time: 09/01/19  6:05 PM  Result Value Ref Range   Glucose-Capillary 139 (H) 70 - 99 mg/dL  I-STAT 7, (LYTES, BLD GAS, ICA, H+H)     Status: Abnormal   Collection Time: 09/01/19  6:06 PM  Result Value Ref Range   pH, Arterial 7.314 (L) 7.350 - 7.450   pCO2  arterial 37.8 32.0 - 48.0 mmHg   pO2, Arterial 90.0 83.0 - 108.0 mmHg   Bicarbonate 19.4 (L) 20.0 - 28.0 mmol/L   TCO2 21 (L) 22 - 32 mmol/L   O2 Saturation 97.0 %   Acid-base deficit 6.0 (H) 0.0 - 2.0 mmol/L   Sodium 141 135 - 145 mmol/L   Potassium 4.5 3.5 - 5.1 mmol/L   Calcium, Ion 1.18 1.15 - 1.40 mmol/L   HCT 26.0 (L) 39.0 - 52.0 %   Hemoglobin 8.8 (L) 13.0 - 17.0 g/dL   Patient temperature 36.4 C    Sample type ARTERIAL   Prepare RBC     Status: None   Collection Time: 09/01/19  6:10 PM  Result Value Ref Range   Order Confirmation      ORDER PROCESSED BY BLOOD BANK Performed at [facility], [address], [place], [place] [address]   Glucose, capillary     Status: Abnormal   Collection Time: 09/01/19  7:00 PM  Result Value Ref Range   Glucose-Capillary 130 (H) 70 - 99 mg/dL  CBC     Status: Abnormal   Collection Time: 09/01/19  7:05 PM  Result Value Ref Range   WBC 11.9 (H) 4.0 - 10.5 K/uL   RBC 2.65 (L) 4.22 - 5.81 MIL/uL   Hemoglobin 8.8 (L) 13.0 - 17.0 g/dL   HCT 27.0 (L) 39.0 - 52.0 %   MCV 101.9 (H) 80.0 - 100.0 fL   MCH 33.2 26.0 - 34.0 pg   MCHC 32.6 30.0 - 36.0 g/dL   RDW 12.9 11.5 - 15.5 %   Platelets 119 (L) 150 - 400 K/uL    Comment: REPEATED TO VERIFY PLATELET COUNT CONFIRMED BY SMEAR Immature Platelet Fraction  may be clinically indicated, consider ordering this additional test [id number]    nRBC 0.0 0.0 - 0.2 %    Comment: Performed at [facility], [place] [address] [place], [place] [address]  Magnesium     Status: Abnormal   Collection Time: 09/01/19  7:05 PM  Result Value Ref Range   Magnesium 3.2 (H) 1.7 - 2.4 mg/dL    Comment: Performed at [facility] [address], [place], [place] [id number]  Basic metabolic panel     Status: Abnormal   Collection Time: 09/01/19  7:05 PM  Result Value Ref Range   Sodium 139 135 - 145 mmol/L   Potassium 4.3 3.5 - 5.1 mmol/L   Chloride 110 98 - 111 mmol/L   CO2 19 (L) 22 - 32 mmol/L    Glucose, Bld 130 (H) 70 - 99 mg/dL   BUN 9 8 - 23 mg/dL   Creatinine, Ser 1.01 0.61 - 1.24 mg/dL   Calcium 8.0 (L) 8.9 - 10.3 mg/dL   GFR calc non Af Amer >60 >60 mL/min   GFR calc Af Amer >60 >60 mL/min   Anion gap 10 5 - 15    Comment: Performed at [facility], [place] [address], [place], [place] [address]  Glucose, capillary     Status: Abnormal   Collection Time: 09/01/19  8:01 PM  Result Value Ref Range   Glucose-Capillary 113 (H) 70 - 99 mg/dL  Glucose, capillary     Status: Abnormal   Collection Time: 09/01/19  9:04 PM  Result Value Ref Range   Glucose-Capillary 131 (H) 70 - 99 mg/dL  Glucose, capillary     Status: Abnormal   Collection Time: 09/01/19 10:10 PM  Result Value Ref Range   Glucose-Capillary 120 (H) 70 - 99 mg/dL  Glucose, capillary     Status: Abnormal   Collection Time: 09/01/19 11:13 PM  Result Value Ref Range   Glucose-Capillary 100 (H) 70 - 99 mg/dL  Glucose, capillary     Status: Abnormal   Collection Time: 09/02/19 12:10 AM  Result Value Ref Range   Glucose-Capillary 100 (H) 70 - 99 mg/dL  Glucose, capillary     Status: Abnormal   Collection Time: 09/02/19  1:13 AM  Result Value Ref Range   Glucose-Capillary 111 (H) 70 - 99 mg/dL  CBC     Status: Abnormal   Collection Time: 09/02/19  3:54 AM  Result Value Ref Range   WBC 9.5 4.0 - 10.5 K/uL   RBC 2.68 (L) 4.22 - 5.81 MIL/uL   Hemoglobin 8.9 (L) 13.0 - 17.0 g/dL   HCT 26.8 (L) 39.0 - 52.0 %   MCV 100.0 80.0 - 100.0 fL   MCH 33.2 26.0 - 34.0 pg   MCHC 33.2 30.0 - 36.0 g/dL   RDW 13.3 11.5 - 15.5 %   Platelets 101 (L) 150 - 400 K/uL    Comment: Immature Platelet Fraction may be clinically indicated, consider ordering this additional test [id number] CONSISTENT WITH PREVIOUS RESULT    nRBC 0.0 0.0 - 0.2 %    Comment: Performed at [facility], [place], [place], [place] [id number]  Basic metabolic panel     Status: Abnormal   Collection Time: 09/02/19  3:54 AM  Result Value Ref  Range   Sodium 140 135 - 145 mmol/L   Potassium 3.9 3.5 - 5.1 mmol/L   Chloride 112 (H) 98 - 111 mmol/L   CO2 17 (  L) 22 - 32 mmol/L   Glucose, Bld 131 (H) 70 - 99 mg/dL   BUN 10 8 - 23 mg/dL   Creatinine, Ser 1.02 0.61 - 1.24 mg/dL   Calcium 7.8 (L) 8.9 - 10.3 mg/dL   GFR calc non Af Amer >60 >60 mL/min   GFR calc Af Amer >60 >60 mL/min   Anion gap 11 5 - 15    Comment: Performed at [facility] [address] [place], [place] [address]  Magnesium     Status: Abnormal   Collection Time: 09/02/19  3:54 AM  Result Value Ref Range   Magnesium 2.6 (H) 1.7 - 2.4 mg/dL    Comment: Performed at [place] [facility] [address], [place], [place] [address]  Glucose, capillary     Status: Abnormal   Collection Time: 09/02/19  3:59 AM  Result Value Ref Range   Glucose-Capillary 127 (H) 70 - 99 mg/dL  Glucose, capillary     Status: Abnormal   Collection Time: 09/02/19  7:43 AM  Result Value Ref Range   Glucose-Capillary 153 (H) 70 - 99 mg/dL  Glucose, capillary     Status: Abnormal   Collection Time: 09/02/19 11:29 AM  Result Value Ref Range   Glucose-Capillary 57 (L) 70 - 99 mg/dL  Glucose, capillary     Status: Abnormal   Collection Time: 09/02/19 11:33 AM  Result Value Ref Range   Glucose-Capillary 141 (H) 70 - 99 mg/dL  Glucose, capillary     Status: Abnormal   Collection Time: 09/02/19  3:48 PM  Result Value Ref Range   Glucose-Capillary 125 (H) 70 - 99 mg/dL  Basic metabolic panel     Status: Abnormal   Collection Time: 09/02/19  5:06 PM  Result Value Ref Range   Sodium 136 135 - 145 mmol/L   Potassium 3.7 3.5 - 5.1 mmol/L   Chloride 106 98 - 111 mmol/L   CO2 21 (L) 22 - 32 mmol/L   Glucose, Bld 141 (H) 70 - 99 mg/dL   BUN 11 8 - 23 mg/dL   Creatinine, Ser 1.07 0.61 - 1.24 mg/dL   Calcium 8.1 (L) 8.9 - 10.3 mg/dL   GFR calc non Af Amer >60 >60 mL/min   GFR calc Af Amer >60 >60 mL/min   Anion gap 9 5 - 15    Comment: Performed at [facility], [place]  [address], [place], [place] [address]  Magnesium     Status: Abnormal   Collection Time: 09/02/19  5:06 PM  Result Value Ref Range   Magnesium 2.5 (H) 1.7 - 2.4 mg/dL    Comment: Performed at [facility] [address] [place], [place] [address]  CBC     Status: Abnormal   Collection Time: 09/02/19  5:06 PM  Result Value Ref Range   WBC 11.3 (H) 4.0 - 10.5 K/uL   RBC 2.53 (L) 4.22 - 5.81 MIL/uL   Hemoglobin 8.3 (L) 13.0 - 17.0 g/dL   HCT 25.0 (L) 39.0 - 52.0 %   MCV 98.8 80.0 - 100.0 fL   MCH 32.8 26.0 - 34.0 pg   MCHC 33.2 30.0 - 36.0 g/dL   RDW 13.5 11.5 - 15.5 %   Platelets 103 (L) 150 - 400 K/uL    Comment: REPEATED TO VERIFY Immature Platelet Fraction may be clinically indicated, consider ordering this additional test [id number] CONSISTENT WITH PREVIOUS RESULT    nRBC 0.0 0.0 - 0.2 %  Comment: Performed at [facility], [place] [address] [place], [place] [address]  Glucose, capillary     Status: Abnormal   Collection Time: 09/02/19  8:02 PM  Result Value Ref Range   Glucose-Capillary 109 (H) 70 - 99 mg/dL  Glucose, capillary     Status: Abnormal   Collection Time: 09/02/19 11:36 PM  Result Value Ref Range   Glucose-Capillary 110 (H) 70 - 99 mg/dL  CBC with Differential/Platelet     Status: Abnormal   Collection Time: 09/03/19  3:07 AM  Result Value Ref Range   WBC 8.7 4.0 - 10.5 K/uL   RBC 2.19 (L) 4.22 - 5.81 MIL/uL   Hemoglobin 7.2 (L) 13.0 - 17.0 g/dL   HCT 22.0 (L) 39.0 - 52.0 %   MCV 100.5 (H) 80.0 - 100.0 fL   MCH 32.9 26.0 - 34.0 pg   MCHC 32.7 30.0 - 36.0 g/dL   RDW 13.5 11.5 - 15.5 %   Platelets 89 (L) 150 - 400 K/uL    Comment: Immature Platelet Fraction may be clinically indicated, consider ordering this additional test [id number] CONSISTENT WITH PREVIOUS RESULT    nRBC 0.0 0.0 - 0.2 %   Neutrophils Relative % 67 %   Neutro Abs 5.9 1.7 - 7.7 K/uL   Lymphocytes Relative 18 %   Lymphs Abs 1.6 0.7 - 4.0 K/uL   Monocytes Relative 14 %    Monocytes Absolute 1.2 (H) 0.1 - 1.0 K/uL   Eosinophils Relative 1 %   Eosinophils Absolute 0.0 0.0 - 0.5 K/uL   Basophils Relative 0 %   Basophils Absolute 0.0 0.0 - 0.1 K/uL   Immature Granulocytes 0 %   Abs Immature Granulocytes 0.03 0.00 - 0.07 K/uL    Comment: Performed at [facility], [place] [address] [place], [place] [id number]  Basic metabolic panel     Status: Abnormal   Collection Time: 09/03/19  3:07 AM  Result Value Ref Range   Sodium 136 135 - 145 mmol/L   Potassium 3.7 3.5 - 5.1 mmol/L   Chloride 105 98 - 111 mmol/L   CO2 26 22 - 32 mmol/L   Glucose, Bld 109 (H) 70 - 99 mg/dL   BUN 11 8 - 23 mg/dL   Creatinine, Ser 0.94 0.61 - 1.24 mg/dL   Calcium 7.8 (L) 8.9 - 10.3 mg/dL   GFR calc non Af Amer >60 >60 mL/min   GFR calc Af Amer >60 >60 mL/min   Anion gap 5 5 - 15    Comment: Performed at [facility], [place] [address], [place], [place] [address]  Glucose, capillary     Status: Abnormal   Collection Time: 09/03/19  3:22 AM  Result Value Ref Range   Glucose-Capillary 104 (H) 70 - 99 mg/dL  Glucose, capillary     Status: Abnormal   Collection Time: 09/03/19  8:05 AM  Result Value Ref Range   Glucose-Capillary 104 (H) 70 - 99 mg/dL    ECG   N/A - Personally Reviewed  Telemetry   Sinus rhythm - Personally Reviewed  Radiology    Dg Chest Port 1 View  Result Date: 09/02/2019 CLINICAL DATA:  Status post coronary artery bypass grafting. Status postextubation EXAM: PORTABLE CHEST 1 VIEW COMPARISON:  September 01, 2019 FINDINGS: Endotracheal tube and nasogastric tube have been removed. Central catheter positions are unchanged. Swan-Ganz catheter tip is in the right main pulmonary artery. Catheter placed from a femoral approach has its tip  in the proximal left lower lobe pulmonary artery. There is a chest tube on the left. No pneumothorax. There is left lower lobe atelectasis with minimal left pleural effusion. The right lung is clear. Heart is upper normal in  size with pulmonary vascularity normal. Status post coronary artery bypass grafting. No adenopathy. No bone lesions. IMPRESSION: Tube and catheter positions as described without pneumothorax. Note that the endotracheal tube and nasogastric tube have been removed. Left lower lobe atelectatic change with small left pleural effusion. Right lung clear. Stable cardiac silhouette. Electronically Signed   By: Lowella Grip III M.D.   On: 09/02/2019 10:48   Dg Chest Port 1 View  Result Date: 09/01/2019 CLINICAL DATA:  Status post CABG. EXAM: PORTABLE CHEST 1 VIEW COMPARISON:  Chest x-ray from yesterday. FINDINGS: Endotracheal tube in position with the tip 1.6 cm above the level of the carina. Enteric tube entering the stomach with the tip below the field of view. Right internal jugular Swan-Ganz catheter with the tip in the right pulmonary artery. Mediastinal and left chest tubes are in good position. Interval CABG. Normal heart size. Low lung volumes with bronchovascular crowding. Bibasilar atelectasis. Possible layering small right pleural effusion. No pneumothorax. No acute osseous abnormality. IMPRESSION: 1. Status post CABG with appropriately positioned support tubes and apparatus. 2. Low lung volumes. Possible layering small right pleural effusion. Electronically Signed   By: Titus Dubin M.D.   On: 09/01/2019 13:45    Cardiac Studies   N/A  Assessment   Principal Problem:   Coronary artery disease involving native coronary artery of native heart with unstable angina pectoris ([place]) Active Problems:   Familial hyperlipidemia   Unstable angina pectoris (HCC)   S/P CABG x 4   Essential hypertension   Plan   1. CAD s/p CABG x 4 - recovering POD #1, no complaints. Requiring neo - bp low, plan to add midodrine. On aspirin. 2. 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 3. Hypertension -remains hypotensive on neo. Plan for midodrine. 4. Anasarca - Now up 4L (9kg in 2 days) -  will need diuresis, suggest lasix gtts given low bp. 5. Anemia - Hemoglobin trending down - now 7.2, ?due to bleeding or dilutional, may benefit from colloid transfusion with regards to hypotension and anasarca.  Time Spent Directly with Patient:  I have spent a total 35 minutes with the patient reviewing hospital notes, telemetry, EKGs, labs an examining the patient as well as establishing an assessment and plan that was discussed personally with the patient.  > 50% of time was spent in direct patient care.  Length of Stay:  LOS: 3 days   [name], MD, [facility], [place] Director of the Advanced Lipid Disorders &  Cardiovascular Risk Reduction Clinic Diplomate of the American Board of Clinical Lipidology Attending Cardiologist  Direct Dial: [phone]  Fax: [phone]  Website:  [email or web address] Edris Friedt 09/03/2019, 8:49 AM

## 2019-09-03 NOTE — Progress Notes (Signed)
TCTS Evening Rounds  Stable day; walking in hall, no complaints Orders written for progressive care, but remains on NE Exam stable A/P: increase midodrine  Luca Dyar Z. Orvan Seen, [place]

## 2019-09-03 NOTE — Progress Notes (Signed)
2 Days Post-Op Procedure(s) (LRB): CORONARY ARTERY BYPASS GRAFTING (CABG) x Four , using left internal mammary artery and right leg greater saphenous vein harvested endoscopically (N/A) Transesophageal Echocardiogram (Tee) (N/A) Subjective: Feeling better  Objective: Vital signs in last 24 hours: Temp:  [97.7 F (36.5 C)-99.5 F (37.5 C)] 98.4 F (36.9 C) (10/25 0300) Pulse Rate:  [61-119] 74 (10/25 0700) Cardiac Rhythm: Normal sinus rhythm (10/25 0400) Resp:  [12-28] 14 (10/25 0700) BP: (74-116)/(49-69) 93/57 (10/25 0700) SpO2:  [89 %-100 %] 97 % (10/25 0700) Arterial Line BP: (91-137)/(37-119) 91/37 (10/25 0045) Weight:  [106.7 kg] 106.7 kg (10/25 0500)  Hemodynamic parameters for last 24 hours: PAP: (28-33)/(9-16) 30/9 CO:  [6.2 L/min] 6.2 L/min CI:  [3 L/min/m2] 3 L/min/m2  Intake/Output from previous day: 10/24 0701 - 10/25 0700 In: 1783.1 [P.O.:840; I.V.:843.1; IV Piggyback:100] Out: 1075 [Urine:925; Chest Tube:150] Intake/Output this shift: No intake/output data recorded.  General appearance: alert and cooperative Neurologic: intact Heart: regular rate and rhythm, S1, S2 normal, no murmur, click, rub or gallop Lungs: clear to auscultation bilaterally Abdomen: soft, non-tender; bowel sounds normal; no masses,  no organomegaly Extremities: edema 1+ Wound: c/d/i  Lab Results: Recent Labs    09/02/19 1706 09/03/19 0307  WBC 11.3* 8.7  HGB 8.3* 7.2*  HCT 25.0* 22.0*  PLT 103* 89*   BMET:  Recent Labs    09/02/19 1706 09/03/19 0307  NA 136 136  K 3.7 3.7  CL 106 105  CO2 21* 26  GLUCOSE 141* 109*  BUN 11 11  CREATININE 1.07 0.94  CALCIUM 8.1* 7.8*    PT/INR:  Recent Labs    09/01/19 1400  LABPROT 17.8*  INR 1.5*   ABG    Component Value Date/Time   PHART 7.314 (L) 09/01/2019 1806   HCO3 19.4 (L) 09/01/2019 1806   TCO2 21 (L) 09/01/2019 1806   ACIDBASEDEF 6.0 (H) 09/01/2019 1806   O2SAT 97.0 09/01/2019 1806   CBG (last 3)  Recent Labs     09/02/19 2002 09/02/19 2336 09/03/19 0322  GLUCAP 109* 110* 104*    Assessment/Plan: S/P Procedure(s) (LRB): CORONARY ARTERY BYPASS GRAFTING (CABG) x Four , using left internal mammary artery and right leg greater saphenous vein harvested endoscopically (N/A) Transesophageal Echocardiogram (Tee) (N/A) Mobilize Diuresis midodrine to allow NE discontinuation  May txfer to floor if beds   LOS: 3 days    [name] [name] 09/03/2019

## 2019-09-04 ENCOUNTER — Inpatient Hospital Stay (HOSPITAL_COMMUNITY): Payer: 59

## 2019-09-04 ENCOUNTER — Encounter (HOSPITAL_COMMUNITY): Payer: Self-pay | Admitting: Thoracic Surgery (Cardiothoracic Vascular Surgery)

## 2019-09-04 DIAGNOSIS — Z951 Presence of aortocoronary bypass graft: Secondary | ICD-10-CM | POA: Diagnosis not present

## 2019-09-04 DIAGNOSIS — Z09 Encounter for follow-up examination after completed treatment for conditions other than malignant neoplasm: Secondary | ICD-10-CM

## 2019-09-04 DIAGNOSIS — I9581 Postprocedural hypotension: Secondary | ICD-10-CM

## 2019-09-04 DIAGNOSIS — E7849 Other hyperlipidemia: Secondary | ICD-10-CM | POA: Diagnosis not present

## 2019-09-04 DIAGNOSIS — I25110 Atherosclerotic heart disease of native coronary artery with unstable angina pectoris: Secondary | ICD-10-CM | POA: Diagnosis not present

## 2019-09-04 LAB — BASIC METABOLIC PANEL
Anion gap: 9 (ref 5–15)
BUN: 9 mg/dL (ref 8–23)
CO2: 27 mmol/L (ref 22–32)
Calcium: 8.3 mg/dL — ABNORMAL LOW (ref 8.9–10.3)
Chloride: 103 mmol/L (ref 98–111)
Creatinine, Ser: 0.88 mg/dL (ref 0.61–1.24)
GFR calc Af Amer: >60 mL/min (ref >60)
GFR calc non Af Amer: >60 mL/min (ref >60)
Glucose, Bld: 102 mg/dL — ABNORMAL HIGH (ref 70–99)
Potassium: 3.9 mmol/L (ref 3.5–5.1)
Sodium: 139 mmol/L (ref 135–145)

## 2019-09-04 LAB — BPAM RBC
Blood Product Expiration Date: 202011192359
Blood Product Expiration Date: 202011222359
ISSUE DATE / TIME: 202010231834
Unit Type and Rh: 8400
Unit Type and Rh: 8400

## 2019-09-04 LAB — TYPE AND SCREEN
ABO/RH(D): AB POS
Antibody Screen: NEGATIVE
Unit division: 0
Unit division: 0

## 2019-09-04 LAB — CBC WITH DIFFERENTIAL/PLATELET
Abs Immature Granulocytes: 0.03 10*3/uL (ref 0.00–0.07)
Basophils Absolute: 0 10*3/uL (ref 0.0–0.1)
Basophils Relative: 0 %
Eosinophils Absolute: 0.1 10*3/uL (ref 0.0–0.5)
Eosinophils Relative: 1 %
HCT: 24.9 % — ABNORMAL LOW (ref 39.0–52.0)
Hemoglobin: 8.3 g/dL — ABNORMAL LOW (ref 13.0–17.0)
Immature Granulocytes: 0 %
Lymphocytes Relative: 20 %
Lymphs Abs: 1.7 10*3/uL (ref 0.7–4.0)
MCH: 32.9 pg (ref 26.0–34.0)
MCHC: 33.3 g/dL (ref 30.0–36.0)
MCV: 98.8 fL (ref 80.0–100.0)
Monocytes Absolute: 0.9 10*3/uL (ref 0.1–1.0)
Monocytes Relative: 11 %
Neutro Abs: 5.6 10*3/uL (ref 1.7–7.7)
Neutrophils Relative %: 68 %
Platelets: 117 10*3/uL — ABNORMAL LOW (ref 150–400)
RBC: 2.52 MIL/uL — ABNORMAL LOW (ref 4.22–5.81)
RDW: 13.5 % (ref 11.5–15.5)
WBC: 8.3 10*3/uL (ref 4.0–10.5)
nRBC: 0 % (ref 0.0–0.2)

## 2019-09-04 LAB — GLUCOSE, CAPILLARY
Glucose-Capillary: 102 mg/dL — ABNORMAL HIGH (ref 70–99)
Glucose-Capillary: 102 mg/dL — ABNORMAL HIGH (ref 70–99)
Glucose-Capillary: 106 mg/dL — ABNORMAL HIGH (ref 70–99)
Glucose-Capillary: 112 mg/dL — ABNORMAL HIGH (ref 70–99)
Glucose-Capillary: 125 mg/dL — ABNORMAL HIGH (ref 70–99)
Glucose-Capillary: 78 mg/dL (ref 70–99)
Glucose-Capillary: 95 mg/dL (ref 70–99)

## 2019-09-04 MED ORDER — ENOXAPARIN SODIUM 30 MG/0.3ML ~~LOC~~ SOLN
30.0000 mg | SUBCUTANEOUS | Status: DC
  Administered 2019-09-04 – 2019-09-07 (×4): 30 mg via SUBCUTANEOUS
  Filled 2019-09-04 (×4): qty 0.3

## 2019-09-04 MED ORDER — LACTULOSE 10 GM/15ML PO SOLN: 20.0000 g | Freq: Every day | ORAL | Status: DC | PRN

## 2019-09-04 MED ORDER — FUROSEMIDE 40 MG PO TABS
40.0000 mg | ORAL_TABLET | Freq: Every day | ORAL | Status: DC
  Administered 2019-09-05 – 2019-09-07 (×3): 40 mg via ORAL
  Filled 2019-09-04 (×3): qty 1

## 2019-09-04 MED ORDER — POTASSIUM CHLORIDE CRYS ER 20 MEQ PO TBCR
20.0000 meq | EXTENDED_RELEASE_TABLET | ORAL | Status: AC
  Administered 2019-09-04 (×3): 20 meq via ORAL
  Filled 2019-09-04 (×3): qty 1

## 2019-09-04 MED FILL — Heparin Sodium (Porcine) Inj 1000 Unit/ML: INTRAMUSCULAR | Qty: 30 | Status: AC

## 2019-09-04 MED FILL — Potassium Chloride Inj 2 mEq/ML: INTRAVENOUS | Qty: 40 | Status: AC

## 2019-09-04 MED FILL — Magnesium Sulfate Inj 50%: INTRAMUSCULAR | Qty: 10 | Status: AC

## 2019-09-04 NOTE — Addendum Note (Signed)
Addendum  created 09/04/19 1049 by Josephine Igo, CRNA   Order list changed

## 2019-09-04 NOTE — Progress Notes (Signed)
Progress Note  Patient Name: [name] [name] Date of Encounter: 09/04/2019  Primary Cardiologist: No primary care provider on file.   Subjective   He is doing well this morning and denies chest pain or shortness of breath.  He has been ambulating in the hallway without any difficulties with exception of chronic lightheadedness which he states he has been dealing with for about 10 years.  This morning, he is not been able to ambulate yet due to feeling dizzy and lightheaded.  He states that at home, he will usually get up slowly.  Inpatient Medications    Scheduled Meds: . acetaminophen  1,000 mg Oral Q6H   Or  . acetaminophen (TYLENOL) oral liquid 160 mg/5 mL  1,000 mg Per Tube Q6H  . aspirin EC  325 mg Oral Daily   Or  . aspirin  324 mg Per Tube Daily  . bisacodyl  10 mg Oral Daily   Or  . bisacodyl  10 mg Rectal Daily  . Chlorhexidine Gluconate Cloth  6 each Topical Daily  . docusate sodium  200 mg Oral Daily  . ezetimibe  10 mg Oral Daily  . furosemide  40 mg Intravenous BID  . insulin aspart  0-24 Units Subcutaneous Q4H  . metoprolol tartrate  12.5 mg Oral BID   Or  . metoprolol tartrate  12.5 mg Per Tube BID  . midodrine  10 mg Oral TID WC  . pantoprazole  40 mg Oral Daily  . rosuvastatin  40 mg Oral QPM  . sodium chloride flush  10-40 mL Intracatheter Q12H  . sodium chloride flush  3 mL Intravenous Q12H   Continuous Infusions: . sodium chloride Stopped (09/02/19 1232)  . sodium chloride    . sodium chloride 20 mL/hr at 09/01/19 1325  . lactated ringers    . lactated ringers    . lactated ringers 10 mL/hr at 09/04/19 0400  . phenylephrine (NEO-SYNEPHRINE) Adult infusion 20 mcg/min (09/04/19 0400)   PRN Meds: sodium chloride, lactated ringers, metoprolol tartrate, morphine injection, ondansetron (ZOFRAN) IV, oxyCODONE, phenol, sodium chloride flush, sodium chloride flush, traMADol   Vital Signs    Vitals:   09/04/19 0230 09/04/19 0300 09/04/19 0330 09/04/19  0400  BP: (!) 90/56 (!) 88/58 99/68 (!) 84/55  Pulse: 74 71 68 72  Resp: 15 17 (!) 26 (!) 21  Temp:    98.3 F (36.8 C)  TempSrc:    Oral  SpO2: 100% 100% 100% 100%  Weight:      Height:        Intake/Output Summary (Last 24 hours) at 09/04/2019 0646 Last data filed at 09/04/2019 0400 Gross per 24 hour  Intake 535.57 ml  Output 4030 ml  Net -3494.43 ml   Filed Weights   08/31/19 0600 09/02/19 0600 09/03/19 0500  Weight: 97.1 kg 104.4 kg 106.7 kg    Telemetry    No evidence of V. tach or V. fib- Personally Reviewed  ECG    None today- Personally Reviewed  Physical Exam   GEN: No acute distress.   Cardiac: RRR, no murmurs, rubs, or gallops.  Respiratory: Clear to auscultation bilaterally. GI: Soft, nontender, non-distended  MS:  1-2+ bilateral pitting edema worse on the right lower extremity Neuro:  Nonfocal  Psych: Normal affect   Labs    Chemistry Recent Labs  Lab 08/31/19 1312  09/02/19 1706 09/03/19 0307 09/04/19 0541  NA 139   < > 136 136 139  K 4.0   < >  3.7 3.7 3.9  CL 104   < > 106 105 103  CO2 25   < > 21* 26 27  GLUCOSE 98   < > 141* 109* 102*  BUN 13   < > 11 11 9   CREATININE 1.08   < > 1.07 0.94 0.88  CALCIUM 9.3   < > 8.1* 7.8* 8.3*  PROT 5.9*  --   --   --   --   ALBUMIN 3.8  --   --   --   --   AST 24  --   --   --   --   ALT 19  --   --   --   --   ALKPHOS 35*  --   --   --   --   BILITOT 0.7  --   --   --   --   GFRNONAA >60   < > >60 >60 >60  GFRAA >60   < > >60 >60 >60  ANIONGAP 10   < > 9 5 9    < > = values in this interval not displayed.     Hematology Recent Labs  Lab 09/02/19 0354 09/02/19 1706 09/03/19 0307  WBC 9.5 11.3* 8.7  RBC 2.68* 2.53* 2.19*  HGB 8.9* 8.3* 7.2*  HCT 26.8* 25.0* 22.0*  MCV 100.0 98.8 100.5*  MCH 33.2 32.8 32.9  MCHC 33.2 33.2 32.7  RDW 13.3 13.5 13.5  PLT 101* 103* 89*    Cardiac EnzymesNo results for input(s): TROPONINI in the last 168 hours. No results for input(s): TROPIPOC in the  last 168 hours.   BNPNo results for input(s): BNP, PROBNP in the last 168 hours.   DDimer No results for input(s): DDIMER in the last 168 hours.   Radiology    Dg Chest Port 1 View  Result Date: 09/03/2019 CLINICAL DATA:  Status post CABG EXAM: PORTABLE CHEST 1 VIEW COMPARISON:  09/02/2019 FINDINGS: Interval removal of right neck pulmonary arterial catheter, with right neck vascular sheath remaining in position. Otherwise unchanged examination status post median sternotomy with mediastinal and left-sided chest tubes in position. No significant pneumothorax. Bibasilar atelectasis and small pleural effusions. IMPRESSION: 1. Interval removal of right neck pulmonary arterial catheter, with right neck vascular sheath remaining in position. 2. Otherwise unchanged examination status post median sternotomy with mediastinal and left-sided chest tubes in position. Electronically Signed   By: Eddie Candle M.D.   On: 09/03/2019 15:26    Cardiac Studies   08/31/2019-left heart cath   Ost RCA to Prox RCA lesion is 100% stenosed.  Dist LM to Prox LAD lesion is 99% stenosed.  Ost Cx lesion is 80% stenosed.  Prox LAD to Mid LAD lesion is 100% stenosed.  Mid LM lesion is 80% stenosed.  Prox Cx lesion is 80% stenosed.   Severe  native coronary artery disease with 80% calcified distal left main stenosis; 99% ostial LAD stenosis with total occlusion of the LAD after the proximal diagonal and septal perforating artery; 80% ostial circumflex stenosis followed by 80% proximal stenosis with extensive collateralization to a dominant RCA from the circumflex vessel as well as faint collaterals to the distal LAD, and total proximal RCA occlusion.  LVEDP 14 mm   09/01/2019-echocardiogram POST-OP IMPRESSIONS - Left Ventricle: The left ventricle is unchanged from pre-bypass. - Aorta: The aorta appears unchanged from pre-bypass. - Left Atrial Appendage: The left atrial appendage appears unchanged from  pre-bypass. - Aortic Valve: The aortic valve appears unchanged  from pre-bypass. - Mitral Valve: The mitral valve appears unchanged from pre-bypass. There is mild regurgitation. - Tricuspid Valve: The tricuspid valve appears unchanged from pre-bypass. - Interatrial Septum: The interatrial septum appears unchanged from pre-bypass. - Pericardium: The pericardium appears unchanged from pre-bypass.  PRE-OP FINDINGS  Left Ventricle: The left ventricle has low normal systolic function, with an ejection fraction of 50-55%. The cavity size was normal. There is no increase in left ventricular wall thickness. No evidence of left ventricular regional wall motion  abnormalities.  Right Ventricle: The right ventricle has normal systolic function. The cavity was normal. There is no increase in right ventricular wall thickness.  Left Atrium: Left atrial size was dilated. The left atrial appendage is well visualized and there is no evidence of thrombus present.  Right Atrium: Right atrial size was dilated. Right atrial pressure is estimated at 10 mmHg.  Interatrial Septum: No atrial level shunt detected by color flow Doppler.  Pericardium: There is no evidence of pericardial effusion.  Mitral Valve: The mitral valve is normal in structure. Mitral valve regurgitation is trivial by color flow Doppler.  Tricuspid Valve: The tricuspid valve was normal in structure. Tricuspid valve regurgitation was not visualized by color flow Doppler.  Aortic Valve: The aortic valve is normal in structure. Aortic valve regurgitation was not visualized by color flow Doppler. There is no evidence of aortic valve stenosis.  Pulmonic Valve: The pulmonic valve was normal in structure. Pulmonic valve regurgitation is trivial by color flow Doppler.   Aorta: The aortic root, ascending aorta and aortic arch are normal in size and structure.   Patient Profile     73 yo male with FH,, known CAD, who had a recent  high risk myoview with EF at 39%, multiple areas of ischemia and TID, found to have multivessel CAD and referred for CABG.  Assessment & Plan    #Coronary artery disease status post CABG x4 Doing well postop and currently denies chest pain or shortness of breath.  He remains hypotensive and is on low-dose phenylephrine and midodrine.  He has been ambulating in the hallway without any difficulties or recurrent angina -Continue aspirin  #Familial hyperlipidemia -Continue Crestor, Zetia, Praluent  #Hypertension BP currently at 97/60.  Remains hypotensive on low-dose neo -Continue midodrine -Discontinue metoprolol  #Anasarca-improving #HFmrEF Weight today is 233 pounds which is actually down about 2 pounds from yesterday.  (his weight on admission was 97 kg / 214 pounds).  On physical exams, he has about 1-2+ pitting edema on the bilateral lower extremities. -On IV Lasix 40 mg twice daily with good urinary output  #Normocytic-macrocytic anemia Hemoglobin improved to 8.3 from 7.2 yesterday. -Continue to monitor  For questions or updates, please contact [place] Please consult [email or web address] for contact info under Cardiology/STEMI.      Signed, Jean Rosenthal, MD  09/04/2019, 6:46 AM

## 2019-09-04 NOTE — Progress Notes (Signed)
EVENING ROUNDS NOTE :     [address]       [place], [address]             [phone]                 3 Days Post-Op Procedure(s) (LRB): CORONARY ARTERY BYPASS GRAFTING (CABG) x Four , using left internal mammary artery and right leg greater saphenous vein harvested endoscopically (N/A) Transesophageal Echocardiogram (Tee) (N/A)   Total Length of Stay:  LOS: 4 days  Events:  On 11 on neo    BP 111/62   Pulse 84   Temp 98.3 F (36.8 C) (Oral)   Resp (!) 24   Ht 5\' 8"  (1.727 m)   Wt 105.8 kg   SpO2 98%   BMI 35.47 kg/m         . phenylephrine (NEO-SYNEPHRINE) Adult infusion 13 mcg/min (09/04/19 1400)    I/O last 3 completed shifts: In: 1022.5 [I.V.:1022.5] Out: [id number] [Urine:4610; Chest Tube:160]   CBC Latest Ref Rng & Units 09/04/2019 09/03/2019 09/02/2019  WBC 4.0 - 10.5 K/uL 8.3 8.7 11.3(H)  Hemoglobin 13.0 - 17.0 g/dL 8.3(L) 7.2(L) 8.3(L)  Hematocrit 39.0 - 52.0 % 24.9(L) 22.0(L) 25.0(L)  Platelets 150 - 400 K/uL 117(L) 89(L) 103(L)    BMP Latest Ref Rng & Units 09/04/2019 09/03/2019 09/02/2019  Glucose 70 - 99 mg/dL 102(H) 109(H) 141(H)  BUN 8 - 23 mg/dL 9 11 11   Creatinine 0.61 - 1.24 mg/dL 0.88 0.94 1.07  BUN/Creat Ratio 10 - 24 - - -  Sodium 135 - 145 mmol/L 139 136 136  Potassium 3.5 - 5.1 mmol/L 3.9 3.7 3.7  Chloride 98 - 111 mmol/L 103 105 106  CO2 22 - 32 mmol/L 27 26 21(L)  Calcium 8.9 - 10.3 mg/dL 8.3(L) 7.8(L) 8.1(L)    ABG    Component Value Date/Time   PHART 7.314 (L) 09/01/2019 1806   PCO2ART 37.8 09/01/2019 1806   PO2ART 90.0 09/01/2019 1806   HCO3 19.4 (L) 09/01/2019 1806   TCO2 21 (L) 09/01/2019 1806   ACIDBASEDEF 6.0 (H) 09/01/2019 1806   O2SAT 97.0 09/01/2019 1806       Melodie Bouillon, MD 09/04/2019 5:05 PM

## 2019-09-04 NOTE — Progress Notes (Addendum)
      [address]       [place] [address]             [phone]      3 Days Post-Op Procedure(s) (LRB): CORONARY ARTERY BYPASS GRAFTING (CABG) x Four , using left internal mammary artery and right leg greater saphenous vein harvested endoscopically (N/A) Transesophageal Echocardiogram (Tee) (N/A)   Subjective:  Patient doing okay.  He does state his right leg hurts more than his chest.    Objective: Vital signs in last 24 hours: Temp:  [98.1 F (36.7 C)-99.2 F (37.3 C)] 98.3 F (36.8 C) (10/26 0400) Pulse Rate:  [68-114] 86 (10/26 0700) Cardiac Rhythm: Normal sinus rhythm (10/26 0400) Resp:  [10-32] 21 (10/26 0700) BP: (83-109)/(55-68) 97/60 (10/26 0700) SpO2:  [90 %-100 %] 100 % (10/26 0700) Weight:  [105.8 kg] 105.8 kg (10/26 0500)  Intake/Output from previous day: 10/25 0701 - 10/26 0700 In: 580.9 [I.V.:580.9] Out: 4330 [Urine:4200; Chest Tube:130]  General appearance: alert, cooperative and no distress Heart: regular rate and rhythm Lungs: clear to auscultation bilaterally Abdomen: soft, non-tender; bowel sounds normal; no masses,  no organomegaly Extremities: edema trace Wound: clean and dry,   Lab Results: Recent Labs    09/03/19 0307 09/04/19 0541  WBC 8.7 8.3  HGB 7.2* 8.3*  HCT 22.0* 24.9*  PLT 89* 117*   BMET:  Recent Labs    09/03/19 0307 09/04/19 0541  NA 136 139  K 3.7 3.9  CL 105 103  CO2 26 27  GLUCOSE 109* 102*  BUN 11 9  CREATININE 0.94 0.88  CALCIUM 7.8* 8.3*    PT/INR:  Recent Labs    09/01/19 1400  LABPROT 17.8*  INR 1.5*   ABG    Component Value Date/Time   PHART 7.314 (L) 09/01/2019 1806   HCO3 19.4 (L) 09/01/2019 1806   TCO2 21 (L) 09/01/2019 1806   ACIDBASEDEF 6.0 (H) 09/01/2019 1806   O2SAT 97.0 09/01/2019 1806   CBG (last 3)  Recent Labs    09/03/19 2028 09/04/19 0002 09/04/19 0419  GLUCAP 103* 102* 78    Assessment/Plan: S/P Procedure(s) (LRB): CORONARY ARTERY BYPASS GRAFTING  (CABG) x Four , using left internal mammary artery and right leg greater saphenous vein harvested endoscopically (N/A) Transesophageal Echocardiogram (Tee) (N/A)  1. CV- NSR, BP remains low- on Neo at 20, on Midodrine 10 mg TID, on low dose Lopressor, will discuss discontinuation with Dr. Kipp Brood 2. Pulm- no acute issues, CXR is free from significant pleural effusion, weaning oxygen as tolerated 3. Renal- creatinine stable, remains volume overloaded, on Lasix 40 mg IV BID, supplement K 4. Expected post operative blood loss anemia, Mild Hgb stable at 8.3 5. Expected post operative thrombocytopenia- improving up to 117K 6. Right Leg pain- very painful to touch, not currently on Lovenox, will watch for signs of DVT 7. CBGs- well controlled, patient is not a diabetic, d/c cbgs, SSIP 8. Dispo- patient stable in NSR, persistent issues with Hypotension, on Neo, Midodrine, BB stopped per Cardiology, Hgb is stable, possibly start Lovenox will discuss with Dr. Kipp Brood, can transfer once off Neo   LOS: 4 days    Junie Panning Barrett PA-C 09/04/2019  Agree with above Will continue to wean neo Floor soon  [place] O Belal Scallon

## 2019-09-05 ENCOUNTER — Other Ambulatory Visit: Payer: Self-pay

## 2019-09-05 ENCOUNTER — Encounter (HOSPITAL_COMMUNITY): Payer: Self-pay

## 2019-09-05 DIAGNOSIS — Z951 Presence of aortocoronary bypass graft: Secondary | ICD-10-CM | POA: Diagnosis not present

## 2019-09-05 DIAGNOSIS — I200 Unstable angina: Secondary | ICD-10-CM | POA: Diagnosis not present

## 2019-09-05 DIAGNOSIS — I9581 Postprocedural hypotension: Secondary | ICD-10-CM | POA: Diagnosis not present

## 2019-09-05 DIAGNOSIS — I25110 Atherosclerotic heart disease of native coronary artery with unstable angina pectoris: Secondary | ICD-10-CM | POA: Diagnosis not present

## 2019-09-05 LAB — BASIC METABOLIC PANEL
Anion gap: 7 (ref 5–15)
BUN: 9 mg/dL (ref 8–23)
CO2: 27 mmol/L (ref 22–32)
Calcium: 8.1 mg/dL — ABNORMAL LOW (ref 8.9–10.3)
Chloride: 104 mmol/L (ref 98–111)
Creatinine, Ser: 0.89 mg/dL (ref 0.61–1.24)
GFR calc Af Amer: >60 mL/min (ref >60)
GFR calc non Af Amer: >60 mL/min (ref >60)
Glucose, Bld: 101 mg/dL — ABNORMAL HIGH (ref 70–99)
Potassium: 3.8 mmol/L (ref 3.5–5.1)
Sodium: 138 mmol/L (ref 135–145)

## 2019-09-05 LAB — CBC
HCT: 21.4 % — ABNORMAL LOW (ref 39.0–52.0)
Hemoglobin: 7.1 g/dL — ABNORMAL LOW (ref 13.0–17.0)
MCH: 33 pg (ref 26.0–34.0)
MCHC: 33.2 g/dL (ref 30.0–36.0)
MCV: 99.5 fL (ref 80.0–100.0)
Platelets: 133 10*3/uL — ABNORMAL LOW (ref 150–400)
RBC: 2.15 MIL/uL — ABNORMAL LOW (ref 4.22–5.81)
RDW: 13.4 % (ref 11.5–15.5)
WBC: 6.6 10*3/uL (ref 4.0–10.5)
nRBC: 0 % (ref 0.0–0.2)

## 2019-09-05 MED FILL — Lidocaine HCl Local Preservative Free (PF) Inj 2%: INTRAMUSCULAR | Qty: 15 | Status: AC

## 2019-09-05 MED FILL — Calcium Chloride Inj 10%: INTRAVENOUS | Qty: 10 | Status: AC

## 2019-09-05 MED FILL — Sodium Chloride IV Soln 0.9%: INTRAVENOUS | Qty: 2000 | Status: AC

## 2019-09-05 MED FILL — Sodium Bicarbonate IV Soln 8.4%: INTRAVENOUS | Qty: 50 | Status: AC

## 2019-09-05 MED FILL — Albumin, Human Inj 5%: INTRAVENOUS | Qty: 250 | Status: AC

## 2019-09-05 MED FILL — Electrolyte-R (PH 7.4) Solution: INTRAVENOUS | Qty: 8000 | Status: AC

## 2019-09-05 MED FILL — Heparin Sodium (Porcine) Inj 1000 Unit/ML: INTRAMUSCULAR | Qty: 10 | Status: AC

## 2019-09-05 MED FILL — Mannitol IV Soln 20%: INTRAVENOUS | Qty: 500 | Status: AC

## 2019-09-05 NOTE — Progress Notes (Signed)
Patient received from [facility]. Oriented to room and unit. CHG wipes applied. VSS. Call bell in reach. Will continue to monitor.  Arletta Bale, RN

## 2019-09-05 NOTE — Progress Notes (Signed)
RN Ginger already removed Central line at this time order will be complete

## 2019-09-05 NOTE — Progress Notes (Addendum)
Mr. Donaghey was examined at bedside this morning and is doing well without chest pain or shortness of breath.  He still continues to remain hypotensive despite being on low-dose phenylephrine.  BP has remained in the 70s-100s/50s-60s though his map is in the 70s.  It is reassuring that he continues to remain asymptomatic.  Objectively, he responded well to diuresis and has a total urine output of 3.7 L.  Overall since admission, he is net -2.4 L.  His physical exams reveals mild bibasilar crackles with right lower extremity edema (2-3+ pitting).  His weight this morning is 103.5 kg (from 105.8 kg yesterday).  I believe he is progressing quite well postoperatively and we will continue to follow along.  For his right lower extremity edema, since he is not reporting of calf pain or tenderness suspicion for DVT is low but can consider vascular ultrasound.  Signed Jean Rosenthal, MD  Internal medicine teaching service-PGY 2   Patient seen and evaluated, chart reviewed.  Remains relatively stable but still hypotensive on low-dose phenylephrine.  We stopped his beta-blocker yesterday, and midodrine dose was increased for hypotension.  I expect as he starts to get up and mobilize, his pressure should improve.  Does have some signs of volume overload and remains on intermittent diuretic.  Continue diuresis.   We will follow along and be available if assistance needed.   Glenetta Hew, MD

## 2019-09-05 NOTE — Progress Notes (Signed)
      [address]       [place] [address]             [phone]                 4 Days Post-Op Procedure(s) (LRB): CORONARY ARTERY BYPASS GRAFTING (CABG) x Four , using left internal mammary artery and right leg greater saphenous vein harvested endoscopically (N/A) Transesophageal Echocardiogram (Tee) (N/A)   Events: No events overnight.  No dizziness with ambulation this am _______________________________________________________________ Vitals: BP 102/68   Pulse 88   Temp 97.8 F (36.6 C) (Oral)   Resp 19   Ht 5\' 8"  (1.727 m)   Wt 103.5 kg   SpO2 97%   BMI 34.69 kg/m   - Neuro: alert NAD  - Cardiovascular: sinus  Drips: Neo at 1.     - Pulm: EWOB    ABG    Component Value Date/Time   PHART 7.314 (L) 09/01/2019 1806   PCO2ART 37.8 09/01/2019 1806   PO2ART 90.0 09/01/2019 1806   HCO3 19.4 (L) 09/01/2019 1806   TCO2 21 (L) 09/01/2019 1806   ACIDBASEDEF 6.0 (H) 09/01/2019 1806   O2SAT 97.0 09/01/2019 1806    - Abd: soft ND - Extremity: RLE swelling  .Intake/Output      10/26 0701 - 10/27 0700 10/27 0701 - 10/28 0700   P.O. 480    I.V. (mL/kg) 197.2 (1.9)    Total Intake(mL/kg) 677.2 (6.5)    Urine (mL/kg/hr) 3525 (1.4)    Chest Tube     Total Output 3525    Net -2847.9            _______________________________________________________________ Labs: CBC Latest Ref Rng & Units 09/05/2019 09/04/2019 09/03/2019  WBC 4.0 - 10.5 K/uL 6.6 8.3 8.7  Hemoglobin 13.0 - 17.0 g/dL 7.1(L) 8.3(L) 7.2(L)  Hematocrit 39.0 - 52.0 % 21.4(L) 24.9(L) 22.0(L)  Platelets 150 - 400 K/uL 133(L) 117(L) 89(L)   CMP Latest Ref Rng & Units 09/05/2019 09/04/2019 09/03/2019  Glucose 70 - 99 mg/dL 101(H) 102(H) 109(H)  BUN 8 - 23 mg/dL 9 9 11   Creatinine 0.61 - 1.24 mg/dL 0.89 0.88 0.94  Sodium 135 - 145 mmol/L 138 139 136  Potassium 3.5 - 5.1 mmol/L 3.8 3.9 3.7  Chloride 98 - 111 mmol/L 104 103 105  CO2 22 - 32 mmol/L 27 27 26   Calcium 8.9 - 10.3  mg/dL 8.1(L) 8.3(L) 7.8(L)  Total Protein 6.5 - 8.1 g/dL - - -  Total Bilirubin 0.3 - 1.2 mg/dL - - -  Alkaline Phos 38 - 126 U/L - - -  AST 15 - 41 U/L - - -  ALT 0 - 44 U/L - - -    CXR: none  _______________________________________________________________  Assessment and Plan: POD 4 s/p CABG doing well  Neuro: pain controlled CV: will wean Neo.  BB on hold due to blood pressure issues.  On Midodrine TID.  On asp/statin Pulm: continue pulm toilet Renal: stable GI: on diet Heme: stable ID: stable Endo: SSI  Dispo: floor once off neo  Melodie Bouillon, MD 09/05/2019 10:02 AM

## 2019-09-06 ENCOUNTER — Encounter: Payer: Self-pay | Admitting: *Deleted

## 2019-09-06 LAB — PREPARE RBC (CROSSMATCH)

## 2019-09-06 MED ORDER — SODIUM CHLORIDE 0.9% IV SOLUTION: Freq: Once | INTRAVENOUS | Status: DC

## 2019-09-06 NOTE — Progress Notes (Signed)
      [address]       [place] [address]             [phone]      5 Days Post-Op Procedure(s) (LRB): CORONARY ARTERY BYPASS GRAFTING (CABG) x Four , using left internal mammary artery and right leg greater saphenous vein harvested endoscopically (N/A) Transesophageal Echocardiogram (Tee) (N/A) Subjective: Doing laps in the hallway this morning. No complaints.   Objective: Vital signs in last 24 hours: Temp:  [97.5 F (36.4 C)-99.1 F (37.3 C)] 98.2 F (36.8 C) (10/28 0610) Pulse Rate:  [79-95] 95 (10/28 0809) Cardiac Rhythm: Sinus tachycardia (10/28 0822) Resp:  [17-25] 17 (10/28 0809) BP: (94-111)/(61-77) 105/69 (10/28 0809) SpO2:  [92 %-97 %] 97 % (10/28 0809) Weight:  [101.5 kg] 101.5 kg (10/28 0612)    Intake/Output from previous day: 10/27 0701 - 10/28 0700 In: 251.5 [P.O.:250; I.V.:1.5] Out: 1705 [Urine:1705] Intake/Output this shift: No intake/output data recorded.  General appearance: alert, cooperative and no distress Heart: regular rate and rhythm, S1, S2 normal, no murmur, click, rub or gallop Lungs: clear to auscultation bilaterally Abdomen: soft, non-tender; bowel sounds normal; no masses,  no organomegaly Extremities: 1-2 non pitting edema in bilateral lower extremities Wound: clean and dry  Lab Results: Recent Labs    09/04/19 0541 09/05/19 0254  WBC 8.3 6.6  HGB 8.3* 7.1*  HCT 24.9* 21.4*  PLT 117* 133*   BMET:  Recent Labs    09/04/19 0541 09/05/19 0254  NA 139 138  K 3.9 3.8  CL 103 104  CO2 27 27  GLUCOSE 102* 101*  BUN 9 9  CREATININE 0.88 0.89  CALCIUM 8.3* 8.1*    PT/INR: No results for input(s): LABPROT, INR in the last 72 hours. ABG    Component Value Date/Time   PHART 7.314 (L) 09/01/2019 1806   HCO3 19.4 (L) 09/01/2019 1806   TCO2 21 (L) 09/01/2019 1806   ACIDBASEDEF 6.0 (H) 09/01/2019 1806   O2SAT 97.0 09/01/2019 1806   CBG (last 3)  Recent Labs    09/04/19 1602 09/04/19 2021 09/04/19  2344  GLUCAP 102* 112* 106*    Assessment/Plan: S/P Procedure(s) (LRB): CORONARY ARTERY BYPASS GRAFTING (CABG) x Four , using left internal mammary artery and right leg greater saphenous vein harvested endoscopically (N/A) Transesophageal Echocardiogram (Tee) (N/A)  CV- NSR int the 90s, Sinus tach up to 120 while ambulating. BP well controlled. Continue ASA, zetia, and statin. Continue midodrine.  Pulm- tolerating room air with good oxygen saturation.  Renal-creatinine 0.89, electrolytes okay. Negative fluid balance. Remains 4kg over baseline weight. Continue Lasix H and H 7.1/21.4-will transfuse 1 unit pRBC Endo-blood glucose has been well controlled. 102/112/106   Plan: transfuse 1 unit of pRBC. Continue to ambulate in the halls. Continue lasix for fluid overload. Continue to encourage incentive spirometer. Added flutter valve. Watch right IJ central line site hematoma.     LOS: 6 days    Elgie Collard 09/06/2019

## 2019-09-06 NOTE — Care Management Important Message (Signed)
Important Message  Patient Details  Name: Keion Criner MRN: [id number] Date of Birth: [birth year]   Medicare Important Message Given:  Yes     Shelda Altes 09/06/2019, 11:38 AM

## 2019-09-06 NOTE — Discharge Summary (Signed)
[address]       [place] [address]             [phone]      Physician Discharge Summary  Patient ID: Sheik Bogacki MRN: [id number] DOB/AGE: [birth year] 74 y.o.  Admit date: 08/31/2019 Discharge date: 09/07/2019  Admission Diagnoses:  Patient Active Problem List   Diagnosis Date Noted   Essential hypertension 09/02/2019   Unstable angina pectoris [facility])    Coronary artery disease involving native coronary artery of native heart with unstable angina pectoris ([place]) 08/31/2019   Family history of heart disease in male family member before age 61 09/05/2015   Familial hyperlipidemia 09/08/2014   CAD (coronary artery disease) 09/08/2014   Gout 09/08/2014   Mild obesity 09/08/2014    Discharge Diagnoses:  Principal Problem:   Coronary artery disease involving native coronary artery of native heart with unstable angina pectoris ([place]) Active Problems:   Familial hyperlipidemia   Unstable angina pectoris (HCC)   S/P CABG x 4   Essential hypertension   Discharged Condition: good  HPI:   73 year old male admitted to the hospital following a left heart catheterization.  He has a history of familial hyperlipidemia, coronary artery disease has been managed medically for the last 30 years.  Last months progressive exertional dyspnea and mild exertional chest pain.  His left heart cath showed severe left main and three-vessel coronary artery disease and CTS consulted to assist with management.  Hospital Course:   Mr. O'Meara underwent a CABG x 4 with Dr. Kipp Brood. He tolerated the procedure well and was transferred to the surgical ICU for continued care. He was extubated in a timely manner. POD 1 he had no complaints. It was safe to advance his diet. We mobilized the patient and started a diuretic regimen. He continued to improve but was started on midodrine to allow norepinephrine discontinuation. He was stable to transfer to the stepdown unit for  continued care. POD 3 he continued to progress. We were giving IV lasix BID for fluid overload. We started lovenox for DVT prophylaxis. We held his BB due to hypotension. He was transferred to the stepdown unit on 10/27. On the floor he continued to ambulate with limited assistance. His hemoglobin was 7.1 therefore we transfused one unit of pRBC. His hemoglobin then came up to 8.3/24.9.  Today, he is ambulating with limited assistance, his incisions are healing well, he is tolerating room air and is ready for discharge home.   Consults: Cardiology  Significant Diagnostic Studies:   CLINICAL DATA:  Sore chest post open heart surgery.  EXAM: PORTABLE CHEST 1 VIEW  COMPARISON:  09/03/2019  FINDINGS: Right IJ central venous sheath remains in place and unchanged. Lungs are hypoinflated with subtle stable left base opacification likely small amount of fluid with atelectasis. Cardiomediastinal silhouette and remainder of the exam is unchanged.  IMPRESSION: Minimal stable left base opacification likely small amount of pleural fluid/atelectasis.  Right IJ central venous sheath unchanged.   Electronically Signed   By: Marin Olp M.D.   On: 09/04/2019 07:49   Treatments:   09/01/2019 Patient:  Frederico Hamman O'Meara Pre-Op Dx: 3V CAD   Post-op Dx:  same Procedure: CABG X 4.  LIMA LAD, RSVG D1, OM2, PLV   Endoscopic greater saphenous vein harvest on the right Intra-operative Transesophageal Echocardiogram  Surgeon and Role:      * Lightfoot, Lucile Crater, MD - Primary    Evonnie Pat, PA-C - assisting  Anesthesia  general EBL:  537ml Blood Administration: none Xclamp Time:  61 min Pump Time:  120min  Drains: 27 F blake drain: L, mediastinal  Wires: A and V Counts: correct   Indications: 73 year old male with a long history of familial hyperlipidemia coronary artery disease underwent an elective LHC which showed severe 3 vessel disease Findings: Decent LAD target.   A 1.5 mm probe was able to pass down to the apex.  Good circumflex, targets good PLV.  Good conduit  Discharge Exam: Blood pressure (!) 120/95, pulse 84, temperature 99.1 F (37.3 C), temperature source Oral, resp. rate 20, height 5\' 8"  (1.727 m), weight 101 kg, SpO2 93 %.   General appearance: alert, cooperative and no distress Heart: regular rate and rhythm, S1, S2 normal, no murmur, click, rub or gallop Lungs: clear to auscultation bilaterally Abdomen: soft, non-tender; bowel sounds normal; no masses,  no organomegaly Extremities: 1-2+ pitting edema in right lower leg (evh site) Wound: clean and dry   Disposition: Discharge disposition: 01-Home or Self Care        Allergies as of 09/07/2019   No Known Allergies     Medication List    STOP taking these medications   clopidogrel 75 MG tablet Commonly known as: PLAVIX   hydrochlorothiazide 12.5 MG capsule Commonly known as: MICROZIDE   metoprolol succinate 25 MG 24 hr tablet Commonly known as: TOPROL-XL   nitroGLYCERIN 0.4 MG SL tablet Commonly known as: NITROSTAT   telmisartan 80 MG tablet Commonly known as: MICARDIS     TAKE these medications   aspirin 325 MG tablet Take 81 mg by mouth every evening.   cetirizine 10 MG tablet Commonly known as: ZYRTEC Take 10 mg by mouth every evening.   ezetimibe 10 MG tablet Commonly known as: ZETIA TAKE 1 TABLET BY MOUTH  DAILY What changed: when to take this   FISH OIL PO Take 1,000 mg by mouth.   furosemide 40 MG tablet Commonly known as: LASIX Take 1 tablet (40 mg total) by mouth daily.   midodrine 10 MG tablet Commonly known as: PROAMATINE Take 1 tablet (10 mg total) by mouth 3 (three) times daily with meals.   Nasal Spray 0.05 % Soln Place 1 spray into the nose daily as needed (congestion).   Niaspan 1000 MG CR tablet Generic drug: niacin TAKE 1 TABLET BY MOUTH  EVERY NIGHT AT BEDTIME   oxyCODONE 5 MG immediate release tablet Commonly known as:  Oxy IR/ROXICODONE Take 1-2 tablets (5-10 mg total) by mouth every 3 (three) hours as needed for severe pain.   Praluent 150 MG/ML Soaj Generic drug: Alirocumab Inject 150 mg into the skin every 14 (fourteen) days.   rosuvastatin 40 MG tablet Commonly known as: CRESTOR TAKE 1 TABLET BY MOUTH  DAILY What changed: when to take this   Vitamin D3 125 MCG (5000 UT) Caps Take 5,000 Units by mouth every evening.      Follow-up Information    Lendon Colonel, NP Follow up.   Specialties: Nurse Practitioner, Radiology, Cardiology Why: Jory Sims, NP 11/9 @10 :45 am ([facility])  Contact information: [place] [address] [phone]        Hulan Fess, MD. Call in 1 day(s).   Specialty: Family Medicine Contact information: [place] [address] [phone]        Lajuana Matte, MD Follow up.   Specialty: Cardiothoracic Surgery Why: Your routine follow-up appointment is on 09/15/2019 at  10:45am. Please your hospital paperwork.  Contact information: [place] [address] [phone]          The patient has been discharged on:   1.Beta Blocker:  Yes [   ]                              No   [ x  ]                              If No, reason: hypotension  2.Ace Inhibitor/ARB: Yes [   ]                                     No  [  x  ]                                     If No, reason: hypotension  3.Statin:   Yes [x   ]                  No  [   ]                  If No, reason:  4.Ecasa:  Yes  [ x  ]                  No   [   ]                  If No, reason:    Signed: Elgie Collard 09/07/2019, 8:39 AM

## 2019-09-06 NOTE — Progress Notes (Signed)
Pt ambulated 470 feet in hall way with walker and one standby assisted. He tolerated well without any acute distress noted. Vital signs stable, HR 70s-80s, sinus rhythm on monitor, BP 98/65- 110/68 mmHg, remained afebrile. Pain tolerated well. He slept very well tonight. Continue to monitor.  Kennyth Lose, RN

## 2019-09-06 NOTE — Progress Notes (Signed)
This morning Pt walked 470 feet in hallway, well tolerated, HR 117-127,sinus tachy cardia on monitor, SPO2 room air 97% when walking. No acute distress. At rest, HR 80s. Continue to monitor.  Kennyth Lose, RN

## 2019-09-06 NOTE — Progress Notes (Signed)
CARDIAC REHAB PHASE I   PRE:  Rate/Rhythm: 92 irreg  BP:  Sitting: 101/65      SaO2: 97 RA  MODE:  Ambulation: 470 ft   POST:  Rate/Rhythm: 125 ST  BP:  Sitting: 107/80    SaO2: 98 RA  Pt ambulated 437ft in hallway with a sitting reast break half way through. Pt states need to sit to "evaluate if lightheaded", pt concluded he was not dizzy and a little short of breath. Pt with consistent weak cough throughout walk. Pt helped to BR then returned to recliner. Requested Flutter for pt. Encouraged a third walk later today and continued IS use. Pt requesting walker for home use. Will continue to follow.  [id number] Rufina Falco, RN BSN 09/06/2019 10:48 AM

## 2019-09-06 NOTE — Progress Notes (Signed)
Patient ambulated in hallway with family. aproximately 700 feet. Back in room. Call bell with in reach. Giorgia Wahler, Bettina Gavia RN

## 2019-09-06 NOTE — Discharge Instructions (Signed)

## 2019-09-07 ENCOUNTER — Telehealth: Payer: 59 | Admitting: Cardiovascular Disease

## 2019-09-07 LAB — HEMOGLOBIN AND HEMATOCRIT, BLOOD
HCT: 24.9 % — ABNORMAL LOW (ref 39.0–52.0)
Hemoglobin: 8.3 g/dL — ABNORMAL LOW (ref 13.0–17.0)

## 2019-09-07 LAB — TYPE AND SCREEN
ABO/RH(D): AB POS
Antibody Screen: NEGATIVE
Unit division: 0

## 2019-09-07 LAB — BPAM RBC
Blood Product Expiration Date: 202011052359
ISSUE DATE / TIME: 202010281335
Unit Type and Rh: 6200

## 2019-09-07 MED ORDER — OXYCODONE HCL 5 MG PO TABS: 5.0000 mg | ORAL_TABLET | ORAL | 0 refills | Status: DC | PRN

## 2019-09-07 MED ORDER — MIDODRINE HCL 10 MG PO TABS: 10.0000 mg | ORAL_TABLET | Freq: Three times a day (TID) | ORAL | 0 refills | Status: DC

## 2019-09-07 MED ORDER — FUROSEMIDE 40 MG PO TABS: 40.0000 mg | ORAL_TABLET | Freq: Every day | ORAL | 0 refills | Status: DC

## 2019-09-07 NOTE — Progress Notes (Signed)
      [address]       [place] [address]             [phone]      6 Days Post-Op Procedure(s) (LRB): CORONARY ARTERY BYPASS GRAFTING (CABG) x Four , using left internal mammary artery and right leg greater saphenous vein harvested endoscopically (N/A) Transesophageal Echocardiogram (Tee) (N/A) Subjective: Only complaint is a dry cough.   Objective: Vital signs in last 24 hours: Temp:  [98.3 F (36.8 C)-99 F (37.2 C)] 98.8 F (37.1 C) (10/28 2036) Pulse Rate:  [80-96] 84 (10/28 2036) Cardiac Rhythm: Sinus tachycardia (10/28 1955) Resp:  [17-24] 19 (10/28 2036) BP: (101-116)/(65-80) 102/70 (10/28 2036) SpO2:  [94 %-100 %] 96 % (10/28 2036)     Intake/Output from previous day: 10/28 0701 - 10/29 0700 In: 666.6 [P.O.:240; Blood:426.6] Out: 600 [Urine:600] Intake/Output this shift: Total I/O In: -  Out: 250 [Urine:250]  General appearance: alert, cooperative and no distress Heart: regular rate and rhythm, S1, S2 normal, no murmur, click, rub or gallop Lungs: clear to auscultation bilaterally Abdomen: soft, non-tender; bowel sounds normal; no masses,  no organomegaly Extremities: 1-2+ pitting edema in right lower leg (evh site) Wound: clean and dry  Lab Results: Recent Labs    09/05/19 0254 09/07/19 0322  WBC 6.6  --   HGB 7.1* 8.3*  HCT 21.4* 24.9*  PLT 133*  --    BMET:  Recent Labs    09/05/19 0254  NA 138  K 3.8  CL 104  CO2 27  GLUCOSE 101*  BUN 9  CREATININE 0.89  CALCIUM 8.1*    PT/INR: No results for input(s): LABPROT, INR in the last 72 hours. ABG    Component Value Date/Time   PHART 7.314 (L) 09/01/2019 1806   HCO3 19.4 (L) 09/01/2019 1806   TCO2 21 (L) 09/01/2019 1806   ACIDBASEDEF 6.0 (H) 09/01/2019 1806   O2SAT 97.0 09/01/2019 1806   CBG (last 3)  Recent Labs    09/04/19 1602 09/04/19 2021 09/04/19 2344  GLUCAP 102* 112* 106*    Assessment/Plan: S/P Procedure(s) (LRB): CORONARY ARTERY BYPASS  GRAFTING (CABG) x Four , using left internal mammary artery and right leg greater saphenous vein harvested endoscopically (N/A) Transesophageal Echocardiogram (Tee) (N/A)  CV- NSR int the 90s, Sinus tach up to 120 while ambulating. BP well controlled. Continue ASA, zetia, and statin. Continue midodrine. BB on hold due to low BP. Pulm- tolerating room air with good oxygen saturation.  Renal-creatinine 0.89, electrolytes okay. Negative fluid balance. Remains over baseline weight. Continue Lasix H and H 8.3/24.9-s/p 1 unit pRBC Endo- 102/112/106, well controlled.  Plan: One week of lasix for fluid overload. Continue throat drops and lubricate throat often for dry cough. Not sure any particular cough medicine will help. BP 120/95 this morning. May be able to wean midodrine.     LOS: 7 days    [name] [name] 09/07/2019

## 2019-09-07 NOTE — Progress Notes (Signed)
Nsg Discharge Note  Admit Date:  08/31/2019 Discharge date: 09/07/2019   Martis Dommer to be D/C'd per MD order.  AVS completed.  . Patient/caregiver able to verbalize understanding.  Discharge Medication: Allergies as of 09/07/2019   No Known Allergies     Medication List    STOP taking these medications   clopidogrel 75 MG tablet Commonly known as: PLAVIX   hydrochlorothiazide 12.5 MG capsule Commonly known as: MICROZIDE   metoprolol succinate 25 MG 24 hr tablet Commonly known as: TOPROL-XL   nitroGLYCERIN 0.4 MG SL tablet Commonly known as: NITROSTAT   telmisartan 80 MG tablet Commonly known as: MICARDIS     TAKE these medications   aspirin 325 MG tablet Take 81 mg by mouth every evening.   cetirizine 10 MG tablet Commonly known as: ZYRTEC Take 10 mg by mouth every evening.   ezetimibe 10 MG tablet Commonly known as: ZETIA TAKE 1 TABLET BY MOUTH  DAILY What changed: when to take this   FISH OIL PO Take 1,000 mg by mouth.   furosemide 40 MG tablet Commonly known as: LASIX Take 1 tablet (40 mg total) by mouth daily.   midodrine 10 MG tablet Commonly known as: PROAMATINE Take 1 tablet (10 mg total) by mouth 3 (three) times daily with meals.   Nasal Spray 0.05 % Soln Place 1 spray into the nose daily as needed (congestion).   Niaspan 1000 MG CR tablet Generic drug: niacin TAKE 1 TABLET BY MOUTH  EVERY NIGHT AT BEDTIME   oxyCODONE 5 MG immediate release tablet Commonly known as: Oxy IR/ROXICODONE Take 1-2 tablets (5-10 mg total) by mouth every 3 (three) hours as needed for severe pain.   Praluent 150 MG/ML Soaj Generic drug: Alirocumab Inject 150 mg into the skin every 14 (fourteen) days.   rosuvastatin 40 MG tablet Commonly known as: CRESTOR TAKE 1 TABLET BY MOUTH  DAILY What changed: when to take this   Vitamin D3 125 MCG (5000 UT) Caps Take 5,000 Units by mouth every evening.            Durable Medical Equipment  (From admission,  onward)         Start     Ordered   09/07/19 0908  For home use only DME Walker rolling  Once    Comments: Post op  Question:  Patient needs a walker to treat with the following condition  Answer:  Weakness   09/07/19 0907          Discharge Assessment: Vitals:   09/06/19 2036 09/07/19 0753  BP: 102/70 (!) 120/95  Pulse: 84 84  Resp: 19 20  Temp: 98.8 F (37.1 C) 99.1 F (37.3 C)  SpO2: 96% 93%   Skin clean, dry and intact without evidence of skin break down, no evidence of skin tears noted. IV catheter discontinued intact. Site without signs and symptoms of complications - no redness or edema noted at insertion site, patient denies c/o pain - only slight tenderness at site.  Dressing with slight pressure applied.  D/c Instructions-Education: Discharge instructions given to patient/family with verbalized understanding. D/c education completed with patient/family including follow up instructions, medication list, d/c activities limitations if indicated, with other d/c instructions as indicated by MD - patient able to verbalize understanding, all questions fully answered. Patient instructed to return to ED, call 911, or call MD for any changes in condition.  Patient escorted via [place], and D/C home via private auto.  Dolan Xia, Jolene Schimke, RN 09/07/2019  11:58 AM

## 2019-09-07 NOTE — Progress Notes (Signed)
CARDIAC REHAB PHASE I   D/c education completed with pt. Pt educated on importance of showers and monitoring incisions daily. Encouraged continued IS and Flutter use, walks, and sternal precautions. Pt given in-the-tube sheet along with heart healthy diet. Reviewed restrictions and exercise guidelines. Will refer to CRP II [place]. Pt is interested in participating in Virtual Cardiac and Pulmonary Rehab. Pt advised that Virtual Cardiac and Pulmonary Rehab is provided at no cost to the patient.  Checklist:  1. Pt has smart device  ie smartphone and/or ipad for downloading an app  Yes 2. Reliable internet/wifi service    Yes 3. Understands how to use their smartphone and navigate within an app.  Yes  Pt verbalized understanding and is in agreement.  [id number] Rufina Falco, RN BSN 09/07/2019 9:35 AM

## 2019-09-07 NOTE — TOC Transition Note (Signed)
Transition of Care [facility]) - CM/SW Discharge Note Marvetta Gibbons RN, BSN Transitions of Care Unit 4E- RN Case Manager [phone]   Patient Details  Name: [name] [name] MRN: [id number] Date of Birth: 01/15/1946  Transition of Care [facility]) CM/SW Contact:  Dawayne Patricia, RN Phone Number: 09/07/2019, 10:18 AM   Clinical Narrative:    Pt stable for transition home, s/p CABG. Spoke with pt and daughter at the bedside to discuss transition of care needs- pt plans to stay with daughter for awhile during recovery. Would like a RW for home, no other DME needs noted. Pt moving around well with cardiac rehab no HH needs noted. Order placed for RW - call made to [place] with [employer] - RW to be delivered to room prior to discharge today.    Final next level of care: Home/Self Care Barriers to Discharge: No Barriers Identified   Patient Goals and CMS Choice Patient states their goals for this hospitalization and ongoing recovery are:: to return home CMS Medicare.gov Compare Post Acute Care list provided to:: Patient Choice offered to / list presented to : Patient, Adult Children  Discharge Placement             Home/self care          Discharge Plan and Services   Discharge Planning Services: CM Consult Post Acute Care Choice: Durable Medical Equipment          DME Arranged: Walker rolling DME Agency: [employer] Date DME Agency Contacted: 09/07/19 Time DME Agency Contacted: 1000 Representative spoke with at DME Agency: Thedore Mins HH Arranged: NA [place] Agency: NA        Social Determinants of Health ([place]) Interventions     Readmission Risk Interventions Readmission Risk Prevention Plan 09/07/2019  Post Dischage Appt Complete  Medication Screening Complete  Transportation Screening Complete  Some recent data might be hidden

## 2019-09-08 ENCOUNTER — Telehealth (HOSPITAL_COMMUNITY): Payer: Self-pay

## 2019-09-08 NOTE — Telephone Encounter (Signed)
Called patient to see if he is interested in the Cardiac Rehab Program. Patient expressed interest. Explained scheduling process and went over insurance, patient verbalized understanding. Will contact patient for scheduling once f/u has been completed.  °

## 2019-09-08 NOTE — Telephone Encounter (Signed)
Pt insurance is active and benefits verified through [facility]. Co-pay $30.00, DED $2,500.00/$2,500.00 met, out of pocket $6,500.00/$6,500.00 met, co-insurance 0%. No pre-authorization required. Passport, 09/08/2019 @ 917AM, WSF#[id number]  Will contact patient to see if he is interested in the Cardiac Rehab Program. If interested, patient will need to complete follow up appt. Once completed, patient will be contacted for scheduling upon review by the RN Navigator.

## 2019-09-12 ENCOUNTER — Telehealth: Payer: Self-pay

## 2019-09-12 NOTE — Telephone Encounter (Signed)
Mr Marso called today C/O a cough x 11 days, he tried mucinex OTC with no relief. Over the weekend he developed a short throat and low grade fevers at night with continued cough.  He is having no issues with his incision sites. He is having pain from the cough. I recommended that he should call his PCP -Dr Hulan Fess for eval and treatment of cough and sore throat. Mr Davin stated that he understood and would follow up with his PCP.  He is scheduled to see Dr Kipp Brood on Friday 09/15/2019

## 2019-09-13 NOTE — Progress Notes (Signed)
Cardiology Office Note   Date:  09/18/2019   ID:  Albion, Depietro 28-Jul-1946, MRN [id number]  PCP:  Hulan Fess, MD  Cardiologist:  Dr. Claiborne Billings  CC: Post hospital follow up   History of Present Illness: [name] [name] is a 73 y.o. male who presents for posthospitalization follow-up after coronary artery bypass grafting x4 by Dr. Kipp Brood.  The patient was seen initially by Dr. Claiborne Billings with cardiac catheterization performed on 08/31/2019, revealing severe native coronary artery disease with an 80% calcified distal left main stenosis, 99% ostial LAD stenosis with total occlusion of the LAD after the proximal diagonal and septal perforating artery; 80% ostial circumflex stenosis followed by 80% proximal stenosis with extensive collateralization to the dominant RCA from the circumflex vessel, as well as faint collaterals to the distal LAD, and total proximal RCA occlusion.  The patient had an urgent surgical consultation for CABG revascularization.  The patient underwent CABG on 09/01/2019 (LIMA to LAD, R SVG to diagonal 1, OM 2, and PLV, with endoscopic SVG harvest on the right.  Postoperatively he required 1 unit of packed red blood cells due to blood loss anemia.  He received IV Lasix for fluid overload.  He was discharged on 09/06/2019.  Metoprolol, hydrochlorothiazide, clopidogrel, and telmisartan were discontinued prior to discharge.  This was due to bradycardia and hypotension.,  He comes today feeling "awful." Since being in the hospital he has had a significant cough, chest congestion, sore throat, nasal drainage, He has tried to walk 3 times a day, but gets tired. His HR has been elevated. He cannot sleep due to the coughing. He has been running a fever up to 101.2  Chest X-ray did not reveal pneumonia, he tested negative for COVID, Flu , streph, and pneumonia via labs. Nonetheless, he is now taking Augmentin and Doxycycline. He has been on Zyrtec "for years" and is also using an inhaler.  The inhaler has helped with the chest congestion, but not the coughing and nasal drainage.    He is frustrated as this cold is slowing his recovery. He continues having soreness in his chest from the coughing.  He has multiple questions.   Past Medical History:  Diagnosis Date  . BCC (basal cell carcinoma) 10/07/2009   back of right ear-([facility])  . 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 Coronary artery disease   . Gout    Of the left third, fourth, and fifth MTPareas with swelling of left foot and pain  . Hyperlipidemia   . Hypertension     Past Surgical History:  Procedure Laterality Date  . CARDIAC CATHETERIZATION  01/04/1986  . CORONARY ARTERY BYPASS GRAFT N/A 09/01/2019   Procedure: CORONARY ARTERY BYPASS GRAFTING (CABG) x Four , using left internal mammary artery and right leg greater saphenous vein harvested endoscopically;  Surgeon: Lajuana Matte, MD;  Location: [facility];  Service: Open Heart Surgery;  Laterality: N/A;  . LEFT HEART CATH AND CORONARY ANGIOGRAPHY N/A 08/31/2019   Procedure: LEFT HEART CATH AND CORONARY ANGIOGRAPHY;  Surgeon: Troy Sine, MD;  Location: [facility];  Service: Cardiovascular;  Laterality: N/A;  . TEE WITHOUT CARDIOVERSION N/A 09/01/2019   Procedure: Transesophageal Echocardiogram (Tee);  Surgeon: Lajuana Matte, MD;  Location: [facility];  Service: Open Heart Surgery;  Laterality: N/A;     Current Outpatient Medications  Medication Sig Dispense Refill  . Alirocumab (PRALUENT) 150 MG/ML SOAJ Inject 150 mg  into the skin every 14 (fourteen) days. 2 pen 6  . amoxicillin-clavulanate (AUGMENTIN) 875-125 MG tablet Take 1 tablet by mouth 2 (two) times daily.    Marland Kitchen aspirin EC 81 MG tablet Take 81 mg by mouth daily.    Marland Kitchen doxycycline (VIBRA-TABS) 100 MG tablet Take 100 mg by mouth 2 (two) times daily.    Marland Kitchen ezetimibe (ZETIA) 10 MG tablet TAKE 1 TABLET BY MOUTH  DAILY (Patient taking  differently: Take 10 mg by mouth every evening. ) 90 tablet 3  . furosemide (LASIX) 40 MG tablet Take 1 tablet (40 mg total) by mouth daily. 7 tablet 0  . Oxymetazoline HCl (NASAL SPRAY) 0.05 % SOLN Place 1 spray into the nose daily as needed (congestion).    . rosuvastatin (CRESTOR) 40 MG tablet TAKE 1 TABLET BY MOUTH  DAILY (Patient taking differently: Take 40 mg by mouth every evening. ) 90 tablet 1  . benzonatate (TESSALON PERLES) 100 MG capsule Take 1 capsule (100 mg total) by mouth 3 (three) times daily as needed for cough. 90 capsule 0  . fluticasone (FLONASE) 50 MCG/ACT nasal spray Place 2 sprays into both nostrils daily. 16 g 1  . loratadine (CLARITIN) 10 MG tablet Take 1 tablet (10 mg total) by mouth daily. 30 tablet 3   No current facility-administered medications for this visit.     Allergies:   Patient has no known allergies.    Social History:  The patient  reports that he has never smoked. He has never used smokeless tobacco. He reports current alcohol use of about 5.0 standard drinks of alcohol per week.   Family History:  The patient's family history includes Heart attack in his father; Heart disease in his brother; Hyperlipidemia in his brother and father.    ROS: All other systems are reviewed and negative. Unless otherwise mentioned in H&P    PHYSICAL EXAM: VS:  BP 120/70   Pulse 92   Temp 98.6 F (37 C)   Ht 5\' 8"  (1.727 m)   Wt 214 lb 9.6 oz (97.3 kg)   SpO2 92%   BMI 32.63 kg/m  , BMI Body mass index is 32.63 kg/m. GEN: Well nourished, well developed, in no acute distress HEENT: normal Neck: no JVD, carotid bruits, or masses Cardiac: RRR tachycardic,  no murmurs, rubs, or gallops,no edema  Respiratory:  Clear to auscultation bilaterally, normal work of breathing GI: soft, nontender, nondistended, + BS MS: no deformity or atrophy, sternotomy is well healed but has an excoriated area at the distal portion, no drainage. SVG graft site RLL is well healed.  Mild distal edema is noted.  Skin: warm and dry, no rash. Right upper abdomen, right lateral chest ecchymosis is noted, (they have circled this with a marker).  Neuro:  Strength and sensation are intact Psych: euthymic mood, full affect   EKG:  Sinus rhythm rate of 92 bpm/ ST & T wave abnormality.   Recent Labs: 07/10/2019: BNP 81.2 08/31/2019: ALT 19 09/02/2019: Magnesium 2.5 09/05/2019: BUN 9; Creatinine, Ser 0.89; Platelets 133; Potassium 3.8; Sodium 138 09/07/2019: Hemoglobin 8.3    Lipid Panel    Component Value Date/Time   CHOL 127 04/14/2019 0853   CHOL 153 12/20/2015 0949   TRIG 86 04/14/2019 0853   TRIG 45 12/20/2015 0949   TRIG 72 12/06/2014 0944   HDL 75 04/14/2019 0853   HDL 76 12/20/2015 0949   HDL 94 12/06/2014 0944   CHOLHDL 1.7 04/14/2019 0853   CHOLHDL 2.0  12/20/2015 0949   LDLCALC 35 04/14/2019 0853   LDLCALC 68 12/20/2015 0949      Wt Readings from Last 3 Encounters:  09/18/19 214 lb 9.6 oz (97.3 kg)  09/15/19 216 lb (98 kg)  09/07/19 222 lb 11.2 oz (101 kg)      Other studies Reviewed:  CABG 09/01/2019 CABG X4. LIMA LAD, RSVG D1, OM2, PLV Endoscopic greater saphenous vein harvest on theright Intra-operative Transesophageal Echocardiogram  LHC: 08/31/2019  Ost RCA to Prox RCA lesion is 100% stenosed.  Dist LM to Prox LAD lesion is 99% stenosed.  Ost Cx lesion is 80% stenosed.  Prox LAD to Mid LAD lesion is 100% stenosed.  Mid LM lesion is 80% stenosed.  Prox Cx lesion is 80% stenosed.   Severe  native coronary artery disease with 80% calcified distal left main stenosis; 99% ostial LAD stenosis with total occlusion of the LAD after the proximal diagonal and septal perforating artery; 80% ostial circumflex stenosis followed by 80% proximal stenosis with extensive collateralization to a dominant RCA from the circumflex vessel as well as faint collaterals to the distal LAD, and total proximal RCA occlusion.  Echocardiogram 07/11/2019 .  The left ventricle has normal systolic function with an ejection fraction of 60-65%. The cavity size was normal. Left ventricular diastolic Doppler parameters are consistent with impaired relaxation.  2. The right ventricle has normal systolic function. The cavity was normal. There is no increase in right ventricular wall thickness.  3. Left atrial size was mildly dilated.  4. The aortic valve is grossly normal. No stenosis of the aortic valve.  5. The aorta is abnormal unless otherwise noted.  6. There is moderate dilatation of the ascending aorta.  7. The atrial septum is grossly normal.  ASSESSMENT AND PLAN:  1.  CAD: S/P CABG. Recovery is slow due to viral/bacterial infection. He is on two antibiotics and inhaler, and continues to have a frequent cough.  HR is not well controlled currently. He is not febrile at this time.  I would like to begin low dose BB, (metoprolol tartrate 12.5 mg BID) for HR control but his BP is soft and he is still recovering from infection. HR may be related to inhaled steroids as well. Will have some labs drawn to include BMET and CBC to evaluate his kidney status and for anemia, as his last labs revealed Hgb of 8.2.   2. Viral vs Bacterial infection: As above on two antibiotics. I will prescribe him Tessalon Perles 100 mg TID prn for cough. He has significant nasal drainage. I have suggested loratadine 10 mg daily and Flonase spray for symptomatic relief. He is to follow up with his PCP for management. May need referral to ENT or Allergist if clinically warranted. I have asked him to change and wash his cloth masks frequently.   3. Hyperlipidemia: On Praluent and rosuvastatin.  Checking labs in 3 months with goal of LDL < 70.  4. Hypertension: Low normal. Not on any antihypertensive medications at this time. Was hypotensive in the hospital. Would like to start him on metoprolol due to elevated HR, but will wait until follow up.   Current medicines are reviewed at  length with the patient today.  I have spent over 40 minutes with this patient answering questions, examining him and discussing treatment regimen.   Labs/ tests ordered today include: BMET CBC.  Phill Myron. West Pugh, ANP, AACC   09/18/2019 12:20 PM    [place] [id number]  [address] Office [phone] Fax [phone]  Notice: This dictation was prepared with Dragon dictation along with smaller phrase technology. Any transcriptional errors that result from this process are unintentional and may not be corrected upon review.

## 2019-09-14 ENCOUNTER — Other Ambulatory Visit: Payer: Self-pay

## 2019-09-14 DIAGNOSIS — R05 Cough: Secondary | ICD-10-CM

## 2019-09-14 DIAGNOSIS — R0602 Shortness of breath: Secondary | ICD-10-CM

## 2019-09-14 DIAGNOSIS — R059 Cough, unspecified: Secondary | ICD-10-CM

## 2019-09-15 ENCOUNTER — Encounter: Payer: Self-pay | Admitting: Thoracic Surgery (Cardiothoracic Vascular Surgery)

## 2019-09-15 ENCOUNTER — Ambulatory Visit (INDEPENDENT_AMBULATORY_CARE_PROVIDER_SITE_OTHER): Payer: Self-pay | Admitting: Thoracic Surgery (Cardiothoracic Vascular Surgery)

## 2019-09-15 ENCOUNTER — Ambulatory Visit
Admission: RE | Admit: 2019-09-15 | Discharge: 2019-09-15 | Disposition: A | Discharge status: Home / Self Care | Payer: 59 | Source: Ambulatory Visit | Attending: Thoracic Surgery (Cardiothoracic Vascular Surgery) | Admitting: Thoracic Surgery (Cardiothoracic Vascular Surgery)

## 2019-09-15 ENCOUNTER — Other Ambulatory Visit: Payer: Self-pay

## 2019-09-15 VITALS — BP 125/80 | HR 80 | Temp 97.8°F | Resp 20 | Ht 68.0 in | Wt 216.0 lb

## 2019-09-15 DIAGNOSIS — R059 Cough, unspecified: Secondary | ICD-10-CM

## 2019-09-15 DIAGNOSIS — R05 Cough: Secondary | ICD-10-CM

## 2019-09-15 DIAGNOSIS — R0602 Shortness of breath: Secondary | ICD-10-CM

## 2019-09-15 DIAGNOSIS — I2510 Atherosclerotic heart disease of native coronary artery without angina pectoris: Secondary | ICD-10-CM

## 2019-09-15 DIAGNOSIS — Z951 Presence of aortocoronary bypass graft: Secondary | ICD-10-CM

## 2019-09-15 MED ORDER — FUROSEMIDE 40 MG PO TABS: 40.0000 mg | ORAL_TABLET | Freq: Every day | ORAL | 0 refills | Status: DC

## 2019-09-15 NOTE — Progress Notes (Signed)
      [address]       [place] [address]             [phone]        [facility] [place] Medical Record [id number] Date of Birth: 1945/11/26  Referring: Troy Sine, MD Primary Care: Hulan Fess, MD Primary Cardiologist:No primary care provider on file.  Reason for visit:   follow-up  History of Present Illness:     Mr. O'Meara comes in for his first follow-up.  He mostly complains of a cough, for which he sought care at his PCP.  He was placed on abx, and feels like he is improving  Physical Exam: BP 125/80   Pulse 80   Temp 97.8 F (36.6 C) (Skin)   Resp 20   Ht 5\' 8"  (1.727 m)   Wt 216 lb (98 kg)   SpO2 93% Comment: RA  BMI 32.84 kg/m   Alert NAD RRR, no murmur.  Incision clean, mild redness at the top.  Sternum stabl Abdomen soft, NT/ND 2+ peripheral edema   Diagnostic Studies & Laboratory data: CXR: clear, small effuson     Assessment / Plan:   S/p CABG, doing well Instructed to to paint the top of the incision with betadine solution Will hold the midodrine for now Will continue lasix Will f/u in 1 month   Lajuana Matte 09/15/2019 12:37 PM

## 2019-09-18 ENCOUNTER — Ambulatory Visit: Payer: 59 | Admitting: Adult Health

## 2019-09-18 ENCOUNTER — Other Ambulatory Visit: Payer: Self-pay

## 2019-09-18 ENCOUNTER — Encounter: Payer: Self-pay | Admitting: Adult Health

## 2019-09-18 VITALS — BP 120/70 | HR 92 | Temp 98.6°F | Ht 68.0 in | Wt 214.6 lb

## 2019-09-18 DIAGNOSIS — Z79899 Other long term (current) drug therapy: Secondary | ICD-10-CM

## 2019-09-18 DIAGNOSIS — Z951 Presence of aortocoronary bypass graft: Secondary | ICD-10-CM | POA: Diagnosis not present

## 2019-09-18 DIAGNOSIS — I25110 Atherosclerotic heart disease of native coronary artery with unstable angina pectoris: Secondary | ICD-10-CM

## 2019-09-18 MED ORDER — FLUTICASONE PROPIONATE 50 MCG/ACT NA SUSP: 2.0000 | Freq: Every day | NASAL | 1 refills | Status: DC

## 2019-09-18 MED ORDER — BENZONATATE 100 MG PO CAPS: 100.0000 mg | ORAL_CAPSULE | Freq: Three times a day (TID) | ORAL | 0 refills | Status: DC | PRN

## 2019-09-18 MED ORDER — LORATADINE 10 MG PO TABS: 10.0000 mg | ORAL_TABLET | Freq: Every day | ORAL | 3 refills | Status: DC

## 2019-09-18 NOTE — Patient Instructions (Addendum)
Medication Instructions:  STOP- Zyrtec START- Claritin 10 mg daily START- Flonase Nasal Spray START- Tessalon Perles 100 mg by mouth three as needed for cough  *If you need a refill on your cardiac medications before your next appointment, please call your pharmacy*  Lab Work: CBC and BMP Today  If you have labs (blood work) drawn today and your tests are completely normal, you will receive your results only by: Marland Kitchen MyChart Message (if you have MyChart) OR . A paper copy in the mail If you have any lab test that is abnormal or we need to change your treatment, we will call you to review the results.  Testing/Procedures: None Ordered  Follow-Up: At [facility], you and your health needs are our priority.  As part of our continuing mission to provide you with exceptional heart care, we have created designated Provider Care Teams.  These Care Teams include your primary Cardiologist (physician) and Advanced Practice Providers (APPs -  Physician Assistants and Nurse Practitioners) who all work together to provide you with the care you need, when you need it.  Your next appointment:   November 16th @ 3:45 pm  The format for your next appointment:   In Person  Provider:   Jory Sims, DNP, ANP

## 2019-09-19 LAB — CBC
Hematocrit: 29.1 % — ABNORMAL LOW (ref 37.5–51.0)
Hemoglobin: 9.4 g/dL — ABNORMAL LOW (ref 13.0–17.7)
MCH: 31.1 pg (ref 26.6–33.0)
MCHC: 32.3 g/dL (ref 31.5–35.7)
MCV: 96 fL (ref 79–97)
Platelets: 345 10*3/uL (ref 150–450)
RBC: 3.02 x10E6/uL — ABNORMAL LOW (ref 4.14–5.80)
RDW: 14.2 % (ref 11.6–15.4)
WBC: 6 10*3/uL (ref 3.4–10.8)

## 2019-09-19 LAB — BASIC METABOLIC PANEL
BUN/Creatinine Ratio: 8 — ABNORMAL LOW (ref 10–24)
BUN: 8 mg/dL (ref 8–27)
CO2: 24 mmol/L (ref 20–29)
Calcium: 9.3 mg/dL (ref 8.6–10.2)
Chloride: 101 mmol/L (ref 96–106)
Creatinine, Ser: 0.97 mg/dL (ref 0.76–1.27)
GFR calc Af Amer: 89 mL/min/{1.73_m2} (ref >59)
GFR calc non Af Amer: 77 mL/min/{1.73_m2} (ref >59)
Glucose: 98 mg/dL (ref 65–99)
Potassium: 4.1 mmol/L (ref 3.5–5.2)
Sodium: 142 mmol/L (ref 134–144)

## 2019-09-20 MED ORDER — METOPROLOL TARTRATE 25 MG PO TABS: 12.5000 mg | ORAL_TABLET | Freq: Two times a day (BID) | ORAL | 3 refills | Status: DC

## 2019-09-25 ENCOUNTER — Other Ambulatory Visit: Payer: Self-pay | Admitting: Cardiovascular Disease

## 2019-09-25 ENCOUNTER — Ambulatory Visit: Payer: 59 | Admitting: Adult Health

## 2019-09-29 ENCOUNTER — Other Ambulatory Visit: Payer: Self-pay

## 2019-09-29 ENCOUNTER — Ambulatory Visit: Payer: 59 | Admitting: Cardiovascular Disease

## 2019-09-29 DIAGNOSIS — I10 Essential (primary) hypertension: Secondary | ICD-10-CM

## 2019-09-29 DIAGNOSIS — I25110 Atherosclerotic heart disease of native coronary artery with unstable angina pectoris: Secondary | ICD-10-CM | POA: Diagnosis not present

## 2019-09-29 DIAGNOSIS — E7849 Other hyperlipidemia: Secondary | ICD-10-CM | POA: Diagnosis not present

## 2019-09-29 DIAGNOSIS — Z951 Presence of aortocoronary bypass graft: Secondary | ICD-10-CM

## 2019-09-29 DIAGNOSIS — R059 Cough, unspecified: Secondary | ICD-10-CM

## 2019-09-29 DIAGNOSIS — Z6834 Body mass index (BMI) 34.0-34.9, adult: Secondary | ICD-10-CM

## 2019-09-29 DIAGNOSIS — R05 Cough: Secondary | ICD-10-CM

## 2019-09-29 DIAGNOSIS — E6609 Other obesity due to excess calories: Secondary | ICD-10-CM

## 2019-09-29 DIAGNOSIS — Z79899 Other long term (current) drug therapy: Secondary | ICD-10-CM

## 2019-09-29 MED ORDER — METOPROLOL SUCCINATE ER 25 MG PO TB24: 25.0000 mg | ORAL_TABLET | Freq: Every day | ORAL | 12 refills | Status: DC

## 2019-09-29 MED ORDER — FUROSEMIDE 20 MG PO TABS: 20.0000 mg | ORAL_TABLET | Freq: Every day | ORAL | 11 refills | Status: DC

## 2019-09-29 NOTE — Patient Instructions (Signed)
Medication Instructions:  STOP METOPROLOL TARTRATE  START METOPROLOL SUCCINATE 25MG  DAILY  TAKE LASIX 20MG  DAILY FOR SWELLING If you need a refill on your cardiac medications before your next appointment, please call your pharmacy.  Labwork: FASTING LIPID, CBC, CMET, TSH IN 3 MONTHS  HERE IN OUR OFFICE AT [facility]   You will need to fast. DO NOT EAT OR DRINK PAST MIDNIGHT.       If you have labs (blood work) drawn today and your tests are completely normal, you will receive your results only by: Marland Kitchen MyChart Message (if you have MyChart) OR . A paper copy in the mail If you have any lab test that is abnormal or we need to change your treatment, we will call you to review the results.  Follow-Up: IN 3 months-AFTER LAB In Person Shelva Majestic, MD.    At [facility], you and your health needs are our priority.  As part of our continuing mission to provide you with exceptional heart care, we have created designated Provider Care Teams.  These Care Teams include your primary Cardiologist (physician) and Advanced Practice Providers (APPs -  Physician Assistants and Nurse Practitioners) who all work together to provide you with the care you need, when you need it.  Thank you for choosing [facility] at [facility]!!

## 2019-09-29 NOTE — Progress Notes (Signed)
Patient ID: [name] [name], male   DOB: 1946-04-23, 73 y.o.   MRN: [id number]     HPI:  [name] [name] is a 73 y.o. male who is a former patient of Dr. Rollene Fare.  [name] [name] presents for a follow-up cardiology evaluation following his recent cardiac catheterization and CABG revascularization surgery.  [name] [name] has a long-standing history of familial hyperlipidemia.  His father and one of his brothers had familial hyperlipidemia.   His father died at age 53 secondary to advanced coronary artery disease.  [name] [name] cholesterols in the past had been as high as 400.  [name] [name] was initially diagnosed in the 1970s.  [name] [name] has participated in numerous drug studies over the years and was one of the precipitants in the initial statin trials with Mevacor.  As part of one of his studies, [name] [name] underwent diagnostic cardiac catheterization in [place].  [name] [name] was asymptomatic with chest pain but was found to have mild coronary obstructive disease with 20-40% RCA stenoses, 20-30% circumflex stenoses,  At least moderate LAD stenosis with poststenotic dilatation involving the diagonal and septal perforating artery in 1987.  [name] [name] has not been on medical therapy for CAD, with the exception of aggressive lipid-lowering treatment.  His last nuclear perfusion study was in 2012, which continue to show normal perfusion.  There was attenuation artifact.  Post-rest ejection fraction was 53%. In June 2014 an echo Doppler study  showed an ejection fraction of 55-60%; Normal diastolic parameters.  [name] [name] had mild left atrial dilatation.  There was mild aortic valve sclerosis without stenosis.  The patient tells me [name] [name] has been aggressive with his follow-up.  [name] [name] typically has laboratory checked via life extension on his own. Remotely, [name] [name] also underwent VAP testing to determine particles size and numbers.  H e has been on Zetia 10 mg Crestor 40 mg, Niaspan 2000 mg and 2000 mg of omega-3 fatty acids.  I did review blood work that [name] [name] had had in April 2015 from  life extension and at that time on aggressive medical therapy.  His total cholesterol was 185, triglycerides 59, HDL cholesterol 82, LDL cholesterol 91.  Homocysteine was 11.7.  Hemoglobin A1c was 5.7.  Thyroid function studies were normal.  [name] [name] was not anemic.  [name] [name] had normal renal function.  [name] [name] participated in this study looking at possible side effects from the injection site for PCSK9 inhibitor therapy.  [name] [name] tells me his brother  underwent stenting of his coronary artery age 51.  The patient also has a history of gout, and hypertension.  [name] [name] has been tolerating my [name] 80 mg without side effects.  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  [name] [name] denies chest pain.  [name] [name] denies PND, orthopnea.  [name] [name] denies palpitations.  In February 2016 [name] [name] had an NMR LipoProfile.  Total cholesterol was 185, triglycerides 72, HDL 94, LDL C 77, and LDL particle number was excellent at 683.  Insulin resistance score was 31.  In March 2016  a nuclear perfusion study continued  to show normal perfusion and was low risk with probable bowel attenuation artifact.  As part of his own blood testing [name] [name] had another lipid panel in August 2016 which showed a total cholesterol 140, HDL 50, LDL 77, and very normal triglyceride levels.  [name] [name] had purposeful weight loss of 30 pounds.  [name] [name] tells me [name] [name] had undergone hip surgery by Dr. Lonzo [name] in [place], [place].  [name] [name] underwent an NMR LipoProfile on 12/20/2015.  Total cholesterol is 153, HDL 76, triglycerides  45, and calculated LDL was 68.  [name] [name] had 1241, LDL particles which were predominantly large.  There were only 196 small LDL particles.  April lipoprotein B was normal at 61 as was LPa at 14.  [name] [name] again had his labs rechecked as part of life extension, blood testing in June 2017.  [name] [name] has a remote history of gout and has been taking a you.  All and uric acid was 3.3.  Renal function was normal.  [name] [name] had normal LFTs.  Lipid studies revealed a total cholesterol of 167, triglycerides 46, HDL  85, and LDL 73.  Homocysteine was normal at 9.5.  PSA was 0.8.  Testosterone was low at 297.  Hemoglobin A1c was excellent at 5.3.  TSH was normal.  [name] [name] was not anemic with hemoglobin of 13.5, hematocrit of 42.3.  MCV was minimally increased at 102.  Vitamin D level was normal at 95.7.  [name] [name] had been taking vitamin D 10,000 units daily supplementation  Since I last saw him one year ago, [name] [name] has remained active.  Over the past year [name] [name] remains active.  [name] [name] plays golf at least 2 days per week.  days a week.  [name] [name] walks several other days.  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  [name] [name] is widowed and not sexually active.   [name] [name] underwent follow-up laboratory on 04/14/2017 at life extension.  BUN 16, creatinine 0.96.  Total cholesterol 189, triglycerides 71, HDL 89, LDL 86.  Homocysteine 14.5.  C-reactive protein 2.1.  Hemoglobin 14.1/hematocrit 42.8.  Serum testosterone 196.  TSH 2.6.  Vitamin D 50.2.  Apo lipoprotein B 81.    When I  saw him in December 2018, I had a long discussion with reference to PCSK 9 inhibition in light of his familial hyperlipidemia.  [name] [name] has since been started on Praluent 150 mg every 2-week injections and had received 2 months of treatment.  Follow-up laboratory 2 days ago has shown a total cholesterol of 139, triglycerides 66, HDL 87, and LDL 39.  [name] [name] has not had any side  effects to the medication.  His insurance company had denied Repatha.  [name] [name] continues to be on concomitant therapy with Zetia 10 mg and rosuvastatin 40 mg.  [name] [name] also is on Toprol-XL 25 mg and telmisartan 80 mg for hypertension.  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  [name] [name] recently returned from a two-week trip to [place] to visit his son who lives there.    I last saw him in April 2019.  [name] [name] continued to be asymptomatic with reference to chest pain, and did not have any change in exercise tolerance.  Lipid studies at that time revealed a total cholesterol 139, triglycerides 66, HDL 87 and LDL  cholesterol was 39.  On his own, [name] [name] underwent laboratory by life extension in August 2019.  Chemistry was normal.  Fasting glucose was 83.  Total cholesterol 114, triglycerides 42, HDL 81, and LDL was 25.  Homocystine was normal.  Vitamin D level was 63.2.  Insulin level was normal at 5.7.  Apo lipoprotein B was excellent at 32.  Life line screening screening was done by his own accord which [name] [name] typically gets done every several years.  This now has shown improvement from previous evaluations with carotids now being totally normal without previous plaque.  His abdominal aorta was normal, there was no PVD.  Remains active.  [name] [name] plays golf 2 times per week at a minimum.  [name] [name] admits to weight  gain over the holidays.   When I last saw him in the office in January 2020, [name] [name] was remaining fairly stable.  However, over the past 6 months [name] [name] has noticed some mild gradual increase in exertional shortness of breath.  [name] [name] saw his primary physician, Dr. Hulan Fess and due to his exertional component of dyspnea [name] [name] referred him back to our office and [name] [name] saw Dr. Grayling Congress on July 10, 2019 for further evaluation.  An echo Doppler study on July 11, 2019 revealed EF at 60 to 65% with grade 1 diastolic dysfunction.  [name] [name] was referred for a nuclear perfusion study which was done with a Lexiscan protocol and this showed clear change from his prior evaluations and was interpreted as high risk with transient ischemic dilatation, EF 39%, and ischemia in several distributions. There was significant discussion with Dr. Davina Poke and an attempt at scheduling him for coronary CTA was unsuccessful.  After much discussion the decision was made to proceed with cardiac catheterization which initially the patient had some reservation.  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  Apparently his angiographic data was  reviewed at [facility] and decision was not to proceed with intervention or bypass at that time and [name] [name] has been on medical therapy for over 30 years.    I saw him for telemedicine evaluation on August 30, 2019 prior to undergoing his cardiac catheterization which was scheduled for the following day.  At that time, [name] [name] told me that since [name] [name] was  notified of his abnormal results on his own [name] [name] increased aspirin from 81 mg up to 325 mg and [name] [name] had old prescription of Plavix which had been prescribed by Dr. Rollene Fare approximately 8 years ago.  For the past several weeks [name] [name] put himself back on his oral Plavix medication and [name] [name] stated that [name] [name] had checked on the Internet and felt that even though it may not be as potent as it may have been when initially prescribed to him [name] [name] felt that this provided some additional coverage until his catheterization.  Cardiac catheterization was performed by me on August 31 2019.  This revealed severe native CAD with 80% calcified distal left main stenosis, 99% ostial LAD stenosis with total occlusion of the LAD after the proximal diagonal and septal perforating artery, 80% ostial circumflex stenosis followed by 80% proximal stenosis with extensive collateralization to a dominant RCA from the circumflex as well as faint collaterals to the distal LAD and total proximal RCA occlusion.  [name] [name] underwent urgent surgical consultation and on September 01, 2019 [name] [name] underwent successful CABG revascularization with a LIMA to the LAD, vein to the diagonal 1 and OM 2 and PLV with endoscopic vein harvesting from the right lower extremity.  [name] [name] received IV Lasix for fluid overload.  [name] [name] was discharged on September 06, 2019.  During his hospitalization prolonged, hydrochlorothiazide, Plavix, and telmisartan were discontinued prior to discharge due to bradycardia and hypotension.  [name] [name] was evaluated by Bunnie Domino on September 18, 2019 and had URI symptoms and fever.  A chest x-ray did not reveal pneumonia and  [name] [name] tested negative for Covid, flu, and srep.  [name] [name] has subsequently been evaluated by a physician and his primary care's office and has undergone several courses of antibiotics including Augmentin and doxycycline, but most recently levofloxacin.  His fever has resolved and [name] [name] is feeling better but [name] [name] still admits  to occasional cough.  [name] [name] has not had any pulmonary evaluation.  From a cardiac perspective, [name] [name] admits to residual swelling of his right lower extremity.  [name] [name] had been on Lasix 40 mg daily but this was discontinued approximately 3 weeks ago after [name] [name] had seen Dr. Kipp Brood who performed his CABG revascularization surgery.  [name] [name] continues to be on Praluent 150 mg every 2 weeks, rosuvastatin 40 mg and Zetia 10 mg for his hereditary hyperlipidemia.  [name] [name] is unaware of palpitations.  [name] [name] denies presyncope or syncope.  [name] [name] presents for his initial post hospital evaluation with me.   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   left post shoulder-sup ([id number]), left post leg    Coronary artery disease    Gout    Of the left third, fourth, and fifth MTPareas with swelling of left foot and pain   Hyperlipidemia    Hypertension     Past Surgical History:  Procedure Laterality Date   CARDIAC CATHETERIZATION  01/04/1986   CORONARY ARTERY BYPASS GRAFT N/A 09/01/2019   Procedure: CORONARY ARTERY BYPASS GRAFTING (CABG) x Four , using left internal mammary artery and right leg greater saphenous vein harvested endoscopically;  Surgeon: Lajuana Matte, MD;  Location: [facility];  Service: Open Heart Surgery;  Laterality: N/A;   LEFT HEART CATH AND CORONARY ANGIOGRAPHY N/A 08/31/2019   Procedure: LEFT HEART CATH AND CORONARY ANGIOGRAPHY;  Surgeon: Troy Sine, MD;  Location: [facility];  Service: Cardiovascular;  Laterality: N/A;   TEE WITHOUT CARDIOVERSION N/A 09/01/2019   Procedure:  Transesophageal Echocardiogram (Tee);  Surgeon: Lajuana Matte, MD;  Location: [facility];  Service: Open Heart Surgery;  Laterality: N/A;    No Known Allergies  Current Outpatient Medications  Medication Sig Dispense Refill   Alirocumab (PRALUENT) 150 MG/ML SOAJ Inject 150 mg into the skin every 14 (fourteen) days. 2 pen 6   aspirin EC 81 MG tablet Take 81 mg by mouth daily.     benzonatate (TESSALON PERLES) 100 MG capsule Take 1 capsule (100 mg total) by mouth 3 (three) times daily as needed for cough. 90 capsule 0   ezetimibe (ZETIA) 10 MG tablet TAKE 1 TABLET BY MOUTH  DAILY (Patient taking differently: Take 10 mg by mouth every evening. ) 90 tablet 3   fluticasone (FLONASE) 50 MCG/ACT nasal spray Place 2 sprays into both nostrils daily. 16 g 1   fluticasone (FLOVENT HFA) 110 MCG/ACT inhaler Inhale into the lungs 2 (two) times daily.     levofloxacin (LEVAQUIN) 500 MG tablet Take 500 mg by mouth daily.     loratadine (CLARITIN) 10 MG tablet Take 1 tablet (10 mg total) by mouth daily. 30 tablet 3   Oxymetazoline HCl (NASAL SPRAY) 0.05 % SOLN Place 1 spray into the nose daily as needed (congestion).     rosuvastatin (CRESTOR) 40 MG tablet TAKE 1 TABLET BY MOUTH  DAILY (Patient taking differently: Take 40 mg by mouth every evening. ) 90 tablet 1   furosemide (LASIX) 20 MG tablet Take 1 tablet (20 mg total) by mouth daily. 30 tablet 11   metoprolol succinate (TOPROL-XL) 25 MG 24 hr tablet Take 1 tablet (25 mg total) by mouth daily. 30 tablet 12   No current facility-administered medications for this visit.     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  [name] [name] is married and has 2 children, ages 61 and 97.  [name] [name] has 2 grandchildren.  There is no history of tobacco use.  [name] [name] does not drink alcohol.  Family history is notable that his mother died at age 47 with old age.  Father died at age 74 with advanced atherosclerosis.  [name] [name] has one brother  who is status post coronary stenting.  ROS General: Negative; No fevers, chills, or night sweats HEENT: Negative; No changes in vision or hearing, sinus congestion, difficulty swallowing Pulmonary: Negative; No cough, wheezing, shortness of breath, hemoptysis Cardiovascular:  See HPI; No chest pain, presyncope, syncope, palpitations,  Hives [name] [name] may note some trace ankle swelling GI: Negative; No nausea, vomiting, diarrhea, or abdominal pain GU: Negative; No dysuria, hematuria, or difficulty voiding Musculoskeletal: Negative; no myalgias, joint pain, or weakness Hematologic/Oncologic: Negative; no easy bruising, bleeding Neurologic: Positive for gout Endocrine: Negative; no heat/cold intolerance; no diabetes Neuro: Negative; no changes in balance, headaches Skin: Osteophytic for chronic rash  Psychiatric: Negative; No behavioral problems, depression Sleep: Negative; No daytime sleepiness, hypersomnolence, bruxism, restless legs, hypnogagnic hallucinations Other comprehensive 14 point system review is negative   Physical Exam BP 103/76 (BP Location: Left Arm, Patient Position: Sitting, Cuff Size: Normal)    Pulse 70    Ht '5\' 8"'$  (1.727 m)    Wt 214 lb 3.2 oz (97.2 kg)    BMI 32.57 kg/m    Repeat blood pressure by me was 120/82  Wt Readings from Last 3 Encounters:  09/29/19 214 lb 3.2 oz (97.2 kg)  09/18/19 214 lb 9.6 oz (97.3 kg)  09/15/19 216 lb (98 kg)   General: Alert, oriented, no distress.  Skin: normal turgor, no rashes, warm and dry HEENT: Normocephalic, atraumatic. Pupils equal round and reactive to light; sclera anicteric; extraocular muscles intact;  Nose without nasal septal hypertrophy Mouth/Parynx benign; Mallinpatti scale 3 Neck: No JVD, no carotid bruits; normal carotid upstroke Lungs: clear to ausculatation and percussion; no wheezing or rales Chest wall: without tenderness to palpitation Heart: PMI not displaced, RRR, s1 s2 normal, 1/6 systolic murmur, no  diastolic murmur, no rubs, gallops, thrills, or heaves Abdomen: soft, nontender; no hepatosplenomehaly, BS+; abdominal aorta nontender and not dilated by palpation. Back: no CVA tenderness Pulses 2+ Musculoskeletal: full range of motion, normal strength, no joint deformities Extremities: 1+ tense right lower extremity edema in the leg where his vein harvesting had been done;no clubbing cyanosis, Homan's sign negative  Neurologic: grossly nonfocal; Cranial nerves grossly wnl Psychologic: Normal mood and affect   ECG (independently read by me): Normal sinus rhythm at 70 bpm.  Borderline first-degree AV block with a PR level 206 ms.  Nonspecific T wave abnormality in V1 and V2  January 2020 ECG (independently read by me): Sinus rhythm at 81 bpm.  Mild first-degree AV block with a PR of 206 ms.  Nonspecific T changes.  02/11/2018 ECG (independently read by me): Sinus rhythm at 60 bpm.  First-degree AV block.  No ectopy.  December 2018 ECG (independently read by me): Normal sinus rhythm at 72 bpm.  Early transition.  First-degree AV block with a PR interval of 206 ms.  October 2016 ECG (independently read by me): Normal sinus rhythm at 62.  No ectopy.  No ST segment changes.  ECG (independently read by me): Normal sinus rhythm at 71 bpm.  No ectopy.  QTc interval 441 ms  LABS: BMP Latest Ref Rng & Units 09/18/2019 09/05/2019 09/04/2019  Glucose 65 -  99 mg/dL 98 101(H) 102(H)  BUN 8 - 27 mg/dL 8 9 9   Creatinine 0.76 - 1.27 mg/dL 0.97 0.89 0.88  BUN/Creat Ratio 10 - 24 8(L) - -  Sodium 134 - 144 mmol/L 142 138 139  Potassium 3.5 - 5.2 mmol/L 4.1 3.8 3.9  Chloride 96 - 106 mmol/L 101 104 103  CO2 20 - 29 mmol/L 24 27 27   Calcium 8.6 - 10.2 mg/dL 9.3 8.1(L) 8.3(L)   Hepatic Function Latest Ref Rng & Units 08/31/2019 04/14/2019 02/09/2018  Total Protein 6.5 - 8.1 g/dL 5.9(L) 6.1 6.6  Albumin 3.5 - 5.0 g/dL 3.8 4.4 4.6  AST 15 - 41 U/L 24 29 62(H)  ALT 0 - 44 U/L 19 20 36  Alk Phosphatase 38 -  126 U/L 35(L) 40 49  Total Bilirubin 0.3 - 1.2 mg/dL 0.7 0.4 0.4  Bilirubin, Direct 0.00 - 0.40 mg/dL - 0.13 -   CBC Latest Ref Rng & Units 09/18/2019 09/07/2019 09/05/2019  WBC 3.4 - 10.8 x10E3/uL 6.0 - 6.6  Hemoglobin 13.0 - 17.7 g/dL 9.4(L) 8.3(L) 7.1(L)  Hematocrit 37.5 - 51.0 % 29.1(L) 24.9(L) 21.4(L)  Platelets 150 - 450 x10E3/uL 345 - 133(L)   Lab Results  Component Value Date   MCV 96 09/18/2019   MCV 99.5 09/05/2019   MCV 98.8 09/04/2019   Lab Results  Component Value Date   TSH 2.840 12/06/2014   Lipid Panel     Component Value Date/Time   CHOL 127 04/14/2019 0853   CHOL 153 12/20/2015 0949   TRIG 86 04/14/2019 0853   TRIG 45 12/20/2015 0949   TRIG 72 12/06/2014 0944   HDL 75 04/14/2019 0853   HDL 76 12/20/2015 0949   HDL 94 12/06/2014 0944   CHOLHDL 1.7 04/14/2019 0853   CHOLHDL 2.0 12/20/2015 0949   LDLCALC 35 04/14/2019 0853   LDLCALC 68 12/20/2015 0949   RADIOLOGY: No results found.  CARDIAC STUDIES:  Ost RCA to Prox RCA lesion is 100% stenosed.  Dist LM to Prox LAD lesion is 99% stenosed.  Ost Cx lesion is 80% stenosed.  Prox LAD to Mid LAD lesion is 100% stenosed.  Mid LM lesion is 80% stenosed.  Prox Cx lesion is 80% stenosed.   Severe  native coronary artery disease with 80% calcified distal left main stenosis; 99% ostial LAD stenosis with total occlusion of the LAD after the proximal diagonal and septal perforating artery; 80% ostial circumflex stenosis followed by 80% proximal stenosis with extensive collateralization to a dominant RCA from the circumflex vessel as well as faint collaterals to the distal LAD, and total proximal RCA occlusion.  LVEDP 14 mm  RECOMMENDATION:  Urgent Surgical consultation will be obtained for CABG revascularization.  A stat P2Y12 blood test was sent from the laboratory to assess the antiplatelet effect of his self prescribed 8 years outdated Plavix with his last dose 2 days  ago.   Intervention   IMPRESSION:  1. Coronary artery disease involving native coronary artery of native heart with unstable angina pectoris ([place]) resolved post CABG   2. S/P CABG x 4   3. Essential hypertension   4. Familial hyperlipidemia   5. Cough   6. Medication management   7. Class 1 obesity due to excess calories without serious comorbidity with body mass index (BMI) of 34.0 to 34.9 in adult     ASSESSMENT AND PLAN: [name] [name] is a 73 year old gentleman who has a history of heterozygous familial hyperlipidemia and was initially noted  to have cholesterols in excess of 400 in the 1970s.  His father and brother also has similar history.  His 2 children do not have this disorder.  [name] [name] has been on aggressive lipid-lowering therapy ever since statins were initially introduced and had participated in the early clinical trials of lovastatin and simvastatin.  [name] [name] had previously been demonstrated to have coronary atherosclerosis by cardiac catheterization in the 1980s.  In 2016 prior to undergoing hip surgery [name] [name] underwent a four-year follow-up nuclear perfusion study which continued to be low risk and did not reveal any significant perfusion abnormalities.  His familial hyperlipidemia has beentreated with Praluent 150 mg every 14 days, rosuvastatin 40 mg, Zetia 10 mg in addition to omega-3 fatty acid.  When I had  seen him I discontinued niacin.  Lipid studies were outstanding and most recent evaluation showed a total cholesterol 114 with an LDL cholesterol of 25.  Triglycerides were 42 with HDL 81.  Apo lipoprotein B was 32.  Over the late spring and early summer, [name] [name] developed progressive exertional dyspnea.  An echo Doppler study on September 11/2018 showed an EF at 60 to 65%.  A nuclear perfusion study was very high risk and EF post-rest was 39% and there was evidence for transient ischemic dilatation with ischemia in several vascular distributions.  I performed cardiac  catheterization which revealed life-threatening anatomy as noted above and the following day [name] [name] underwent urgent coronary revascularization by Dr. Kipp Brood successfully.  Presently, [name] [name] has been without recurrent anginal symptomatology.  [name] [name] has had issues with fever increased cough and shortness of breath and has undergone several courses of antibiotic therapy with improvement.  [name] [name] still has a residual mild cough.  [name] [name] tested negative for Covid.  His blood pressure today is stable on his current regimen consisting of metoprolol tartrate 12.5 mg twice a day.  [name] [name] is no longer taking furosemide and had been on 40 mg daily postoperatively.  [name] [name] now has 1+ tense edema in his right lower extremity.  I have recommended reinstitute of furosemide initially at 20 mg for the next 5 to 7 days depending upon his edema response and if edema resolves [name] [name] can then change this to as needed.  I am also simplifying his metoprolol and will change this to metoprolol succinate 25 mg daily.  [name] [name] continues to be on Praluent 150 mg injection every 14 days, rosuvastatin 40 mg daily, and Zetia for his familial hyperlipidemia.  I have recommended follow-up laboratory in 3 months consisting of a comprehensive metabolic panel, lipid panel CBC and TSH level.  I will see follow-up of the laboratory and further recommendations will be made at that time.   Time spent: 30 minutes Troy Sine, MD, [facility] 09/30/2019 11:40 AM

## 2019-09-30 ENCOUNTER — Encounter: Payer: Self-pay | Admitting: Cardiovascular Disease

## 2019-10-02 ENCOUNTER — Ambulatory Visit: Payer: 59 | Admitting: Physician Assistant

## 2019-10-10 ENCOUNTER — Other Ambulatory Visit: Payer: Self-pay | Admitting: Adult Health

## 2019-10-10 ENCOUNTER — Telehealth (HOSPITAL_COMMUNITY): Payer: Self-pay | Admitting: Pharmacist

## 2019-10-10 NOTE — Telephone Encounter (Signed)
Cardiac Rehab Medication Review by a Pharmacist  Does the patient feel that his/her medications are working for him/her?  yes  Has the patient been experiencing any side effects to the medications prescribed?  no  Does the patient measure his/her own blood pressure or blood glucose at home?  yes  Average BP124/75 mmHg  Does the patient have any problems obtaining medications due to transportation or finances?   no  Understanding of regimen: excellent Understanding of indications: excellent Potential of compliance: excellent  Pharmacist comments: N/A  Lorel Monaco, PharmD PGY1 Ambulatory Care Resident [facility] # [phone]

## 2019-10-13 ENCOUNTER — Other Ambulatory Visit: Payer: Self-pay

## 2019-10-13 ENCOUNTER — Ambulatory Visit (INDEPENDENT_AMBULATORY_CARE_PROVIDER_SITE_OTHER): Payer: Self-pay | Admitting: Thoracic Surgery (Cardiothoracic Vascular Surgery)

## 2019-10-13 ENCOUNTER — Encounter: Payer: Self-pay | Admitting: Thoracic Surgery (Cardiothoracic Vascular Surgery)

## 2019-10-13 VITALS — BP 140/90 | HR 88 | Temp 97.8°F | Resp 20 | Ht 68.0 in | Wt 208.0 lb

## 2019-10-13 DIAGNOSIS — I2510 Atherosclerotic heart disease of native coronary artery without angina pectoris: Secondary | ICD-10-CM

## 2019-10-13 DIAGNOSIS — Z951 Presence of aortocoronary bypass graft: Secondary | ICD-10-CM

## 2019-10-13 NOTE — Progress Notes (Signed)
      [address]       [place] [address]             [phone]        [name] [name] [place] Medical Record [id number] Date of Birth: 1946-02-05  Referring: [name] Sine, MD Primary Care: [name] Fess, MD Primary Cardiologist:No primary care provider on file.  Reason for visit:   follow-up  History of Present Illness:     [name] presents for his 2nd follow-up appointment.  He only complains of right lower extremity swelling.  His PCP has restarted his lasix with some improvement  Physical Exam: BP 140/90   Pulse 88   Temp 97.8 F (36.6 C) (Rectal)   Resp 20   Ht 5\' 8"  (1.727 m)   Wt 208 lb (94.3 kg)   SpO2 95% Comment: RA  BMI 31.63 kg/m   Alert NAD Incision clean.  Sternum stable Abdomen soft, NT/ND pitting peripheral edema to the right lower extremity   Diagnostic Studies & Laboratory data:      Assessment / Plan:   S/p CABG.  Doing well Continue lasix Clear for cardiac rehab   [name] [name] 10/13/2019 2:32 PM

## 2019-10-25 ENCOUNTER — Telehealth (HOSPITAL_COMMUNITY): Payer: Self-pay | Admitting: *Deleted

## 2019-10-25 NOTE — Telephone Encounter (Signed)
PC to pt to confirm appt for cardiac rehab orientation and review health history.  Pt confirmed appt and health history reviewed.

## 2019-10-26 ENCOUNTER — Other Ambulatory Visit: Payer: Self-pay

## 2019-10-26 ENCOUNTER — Encounter (HOSPITAL_COMMUNITY)
Admission: RE | Admit: 2019-10-26 | Discharge: 2019-10-26 | Disposition: A | Discharge status: Home / Self Care | Payer: 59 | Source: Ambulatory Visit | Attending: Cardiovascular Disease | Admitting: Cardiovascular Disease

## 2019-10-26 ENCOUNTER — Encounter (HOSPITAL_COMMUNITY): Payer: Self-pay

## 2019-10-26 VITALS — BP 110/70 | HR 87 | Temp 97.2°F | Ht 67.75 in | Wt 215.4 lb

## 2019-10-26 DIAGNOSIS — Z951 Presence of aortocoronary bypass graft: Secondary | ICD-10-CM

## 2019-10-26 NOTE — Progress Notes (Signed)
Cardiac Individual Treatment Plan  Patient Details  Name: [name] [name] MRN: [id number] Date of Birth: 1946-07-25 Referring Provider:     CARDIAC REHAB PHASE II ORIENTATION from 10/26/2019 in [facility]  Referring Provider  Dr. 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  Alirocumab (PRALUENT) 150 MG/ML SOAJ, Inject 150 mg into the skin every 14 (fourteen) days., Disp: 2 pen, Rfl: 6 .  aspirin EC 81 MG tablet, Take 81 mg by mouth daily., Disp: , Rfl:  .  ezetimibe (ZETIA) 10 MG tablet, TAKE 1 TABLET BY MOUTH  DAILY (Patient taking differently: Take 10 mg by mouth every evening. ), Disp: 90 tablet, Rfl: 3 .  furosemide (LASIX) 20 MG tablet, Take 1 tablet (20 mg total) by mouth daily., Disp: 30 tablet, Rfl: 11 .  loratadine (CLARITIN) 10 MG tablet, Take 1 tablet (10 mg total) by mouth daily., Disp: 30 tablet, Rfl: 3 .  metoprolol succinate (TOPROL-XL) 25 MG 24 hr tablet, Take 1 tablet (25 mg total) by mouth daily., Disp: 30 tablet, Rfl: 12 .  Oxymetazoline HCl (NASAL SPRAY) 0.05 % SOLN, Place 1 spray into the nose daily as needed (congestion)., Disp: , Rfl:  .  rosuvastatin (CRESTOR) 40 MG tablet, TAKE 1 TABLET BY MOUTH  DAILY (Patient taking differently: Take 40 mg by mouth every evening. ), Disp: 90 tablet, Rfl: 1 .  benzonatate (TESSALON PERLES) 100 MG capsule, Take 1 capsule (100 mg total) by mouth 3 (three) times daily as needed for cough. (Patient not taking: Reported on 10/26/2019), Disp: 90 capsule, Rfl: 0 .  fluticasone (FLONASE) 50 MCG/ACT nasal spray, Place 2 sprays into both nostrils daily. (Patient not taking: Reported on 10/26/2019), Disp: 16 g, Rfl: 1 .  fluticasone (FLOVENT HFA) 110 MCG/ACT inhaler, Inhale into  the lungs 2 (two) times daily., Disp: , Rfl:   Past Medical History: Past Medical History:  Diagnosis Date  . BCC (basal cell carcinoma) 10/07/2009   back of right ear-(MOHS)  . 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 Coronary artery disease   . Gout    Of the left third, fourth, and fifth MTPareas with swelling of left foot and pain  . Hyperlipidemia   . Hypertension     Tobacco Use: Social History   Tobacco Use  Smoking Status Never Smoker  Smokeless Tobacco Never Used    Labs: Recent Review Flowsheet Data    Labs for ITP Cardiac and Pulmonary Rehab Latest Ref Rng & Units 09/01/2019 09/01/2019 09/01/2019 09/01/2019 09/01/2019   Cholestrol 100 - 199 mg/dL - - - - -   LDLCALC 0 - 99 mg/dL - - - - -   HDL >39 mg/dL - - - - -   Trlycerides 0 - 149 mg/dL - - - - -   Hemoglobin A1c 4.8 - 5.6 % - - - - -   PHART 7.350 - 7.450 - - 7.426 7.314(L) 7.314(L)   PCO2ART 32.0 - 48.0 mmHg - - 35.5 39.4 37.8   HCO3 20.0 - 28.0 mmol/L - - 23.7 20.2 19.4(L)   TCO2 22 - 32 mmol/L 25 25 25  21(L) 21(L)   ACIDBASEDEF  0.0 - 2.0 mmol/L - - 1.0 6.0(H) 6.0(H)   O2SAT % - - 99.0 96.0 97.0      Capillary Blood Glucose: Lab Results  Component Value Date   GLUCAP 106 (H) 09/04/2019   GLUCAP 112 (H) 09/04/2019   GLUCAP 102 (H) 09/04/2019   GLUCAP 95 09/04/2019   GLUCAP 125 (H) 09/04/2019     Exercise Target Goals: Exercise Program Goal: Individual exercise prescription set using results from initial 6 min walk test and THRR while considering  patient's activity barriers and safety.   Exercise Prescription Goal: Starting with aerobic activity 30 plus minutes a day, 3 days per week for initial exercise prescription. Provide home exercise prescription and guidelines that participant acknowledges understanding prior to discharge.  Activity Barriers & Risk Stratification: Activity Barriers & Cardiac Risk Stratification - 10/26/19 0918       Activity Barriers & Cardiac Risk Stratification   Activity Barriers  None    Cardiac Risk Stratification  High       6 Minute Walk: 6 Minute Walk    Row Name 10/26/19 1052         6 Minute Walk   Phase  Initial     Distance  1600 feet     Walk Time  6 minutes     # of Rest Breaks  0     MPH  3     METS  3     RPE  11     Perceived Dyspnea   0     VO2 Peak  10.52     Symptoms  No     Resting HR  87 bpm     Resting BP  110/70     Resting Oxygen Saturation   96 %     Exercise Oxygen Saturation  during 6 min walk  96 %     Max Ex. HR  120 bpm     Max Ex. BP  122/70     2 Minute Post BP  110/70        Oxygen Initial Assessment:   Oxygen Re-Evaluation:   Oxygen Discharge (Final Oxygen Re-Evaluation):   Initial Exercise Prescription: Initial Exercise Prescription - 10/26/19 1000      Date of Initial Exercise RX and Referring Provider   Date  10/26/19    Referring Provider  Dr. Claiborne Billings    Expected Discharge Date  12/22/19      Treadmill   MPH  2.5    Grade  1    Minutes  15      NuStep   Level  3    SPM  85    Minutes  15    METs  2.9      Prescription Details   Frequency (times per week)  3    Duration  Progress to 30 minutes of continuous aerobic without signs/symptoms of physical distress      Intensity   THRR 40-80% of Max Heartrate  58-117    Ratings of Perceived Exertion  11-13      Progression   Progression  Continue to progress workloads to maintain intensity without signs/symptoms of physical distress.      Resistance Training   Training Prescription  Yes    Weight  3 LBS.     Reps  10-15       Perform Capillary Blood Glucose checks as needed.  Exercise Prescription Changes:   Exercise Comments:   Exercise Goals and Review: Exercise Goals  [place] Name 10/26/19 0918             Exercise Goals   Increase Physical Activity  Yes       Intervention  Provide advice, education, support and counseling about physical  activity/exercise needs.;Develop an individualized exercise prescription for aerobic and resistive training based on initial evaluation findings, risk stratification, comorbidities and participant's personal goals.       Expected Outcomes  Short Term: Attend rehab on a regular basis to increase amount of physical activity.;Long Term: Add in home exercise to make exercise part of routine and to increase amount of physical activity.;Long Term: Exercising regularly at least 3-5 days a week.       Increase Strength and Stamina  Yes       Intervention  Provide advice, education, support and counseling about physical activity/exercise needs.;Develop an individualized exercise prescription for aerobic and resistive training based on initial evaluation findings, risk stratification, comorbidities and participant's personal goals.       Expected Outcomes  Short Term: Increase workloads from initial exercise prescription for resistance, speed, and METs.;Short Term: Perform resistance training exercises routinely during rehab and add in resistance training at home;Long Term: Improve cardiorespiratory fitness, muscular endurance and strength as measured by increased METs and functional capacity (6MWT)       Able to understand and use rate of perceived exertion (RPE) scale  Yes       Intervention  Provide education and explanation on how to use RPE scale       Expected Outcomes  Short Term: Able to use RPE daily in rehab to express subjective intensity level;Long Term:  Able to use RPE to guide intensity level when exercising independently       Knowledge and understanding of Target Heart Rate Range (THRR)  Yes       Intervention  Provide education and explanation of THRR including how the numbers were predicted and where they are located for reference       Expected Outcomes  Short Term: Able to state/look up THRR;Long Term: Able to use THRR to govern intensity when exercising independently;Short Term: Able to use  daily as guideline for intensity in rehab       Able to check pulse independently  Yes       Intervention  Provide education and demonstration on how to check pulse in carotid and radial arteries.;Review the importance of being able to check your own pulse for safety during independent exercise       Expected Outcomes  Short Term: Able to explain why pulse checking is important during independent exercise;Long Term: Able to check pulse independently and accurately       Understanding of Exercise Prescription  Yes       Intervention  Provide education, explanation, and written materials on patient's individual exercise prescription       Expected Outcomes  Short Term: Able to explain program exercise prescription;Long Term: Able to explain home exercise prescription to exercise independently          Exercise Goals Re-Evaluation :    Discharge Exercise Prescription (Final Exercise Prescription Changes):   Nutrition:  Target Goals: Understanding of nutrition guidelines, daily intake of sodium 1500mg , cholesterol 200mg , calories 30% from fat and 7% or less from saturated fats, daily to have 5 or more servings of fruits and vegetables.  Biometrics: Pre Biometrics - 10/26/19 1057      Pre Biometrics   Height  5' 7.75" (1.721 m)  Weight  97.7 kg    Waist Circumference  39.5 inches    Hip Circumference  46 inches    Waist to Hip Ratio  0.86 %    BMI (Calculated)  32.99    Triceps Skinfold  20 mm    % Body Fat  30.7 %    Grip Strength  44 kg    Flexibility  12.75 in    Single Leg Stand  30 seconds        Nutrition Therapy Plan and Nutrition Goals:   Nutrition Assessments:   Nutrition Goals Re-Evaluation:   Nutrition Goals Discharge (Final Nutrition Goals Re-Evaluation):   Psychosocial: Target Goals: Acknowledge presence or absence of significant depression and/or stress, maximize coping skills, provide positive support system. Participant is able to verbalize types and  ability to use techniques and skills needed for reducing stress and depression.  Initial Review & Psychosocial Screening: Initial Psych Review & Screening - 10/26/19 1439      Initial Review   Current issues with  None Identified      Family Dynamics   Good Support System?  Yes   Gino has his children for support     Barriers   Psychosocial barriers to participate in program  There are no identifiable barriers or psychosocial needs.      Screening Interventions   Interventions  Encouraged to exercise       Quality of Life Scores: Quality of Life - 10/26/19 1011      Quality of Life   Select  Quality of Life      Quality of Life Scores   Health/Function Pre  20.5 %    Socioeconomic Pre  23.57 %    Psych/Spiritual Pre  20.43 %    Family Pre  17.38 %    GLOBAL Pre  20.76 %      Scores of 19 and below usually indicate a poorer quality of life in these areas.  A difference of  2-3 points is a clinically meaningful difference.  A difference of 2-3 points in the total score of the Quality of Life Index has been associated with significant improvement in overall quality of life, self-image, physical symptoms, and general health in studies assessing change in quality of life.  PHQ-9: Recent Review Flowsheet Data    There is no flowsheet data to display.     Interpretation of Total Score  Total Score Depression Severity:  1-4 = Minimal depression, 5-9 = Mild depression, 10-14 = Moderate depression, 15-19 = Moderately severe depression, 20-27 = Severe depression   Psychosocial Evaluation and Intervention:   Psychosocial Re-Evaluation:   Psychosocial Discharge (Final Psychosocial Re-Evaluation):   Vocational Rehabilitation: Provide vocational rehab assistance to qualifying candidates.   Vocational Rehab Evaluation & Intervention: Vocational Rehab - 10/26/19 1442      Initial Vocational Rehab Evaluation & Intervention   Assessment shows need for Vocational  Rehabilitation  No   Dontrae is working and does not need vocational rehab at this time      Education: Education Goals: Education classes will be provided on a weekly basis, covering required topics. Participant will state understanding/return demonstration of topics presented.  Learning Barriers/Preferences: Learning Barriers/Preferences - 10/26/19 1102      Learning Barriers/Preferences   Learning Barriers  None    Learning Preferences  Verbal Instruction;Audio       Education Topics: Hypertension, Hypertension Reduction -Define heart disease and high blood pressure. Discus how high blood pressure affects the body and  ways to reduce high blood pressure.   Exercise and Your Heart -Discuss why it is important to exercise, the FITT principles of exercise, normal and abnormal responses to exercise, and how to exercise safely.   Angina -Discuss definition of angina, causes of angina, treatment of angina, and how to decrease risk of having angina.   Cardiac Medications -Review what the following cardiac medications are used for, how they affect the body, and side effects that may occur when taking the medications.  Medications include Aspirin, Beta blockers, calcium channel blockers, ACE Inhibitors, angiotensin receptor blockers, diuretics, digoxin, and antihyperlipidemics.   Congestive Heart Failure -Discuss the definition of CHF, how to live with CHF, the signs and symptoms of CHF, and how keep track of weight and sodium intake.   Heart Disease and Intimacy -Discus the effect sexual activity has on the heart, how changes occur during intimacy as we age, and safety during sexual activity.   Smoking Cessation / COPD -Discuss different methods to quit smoking, the health benefits of quitting smoking, and the definition of COPD.   Nutrition I: Fats -Discuss the types of cholesterol, what cholesterol does to the heart, and how cholesterol levels can be  controlled.   Nutrition II: Labels -Discuss the different components of food labels and how to read food label   Heart Parts/Heart Disease and PAD -Discuss the anatomy of the heart, the pathway of blood circulation through the heart, and these are affected by heart disease.   Stress I: Signs and Symptoms -Discuss the causes of stress, how stress may lead to anxiety and depression, and ways to limit stress.   Stress II: Relaxation -Discuss different types of relaxation techniques to limit stress.   Warning Signs of Stroke / TIA -Discuss definition of a stroke, what the signs and symptoms are of a stroke, and how to identify when someone is having stroke.   Knowledge Questionnaire Score: Knowledge Questionnaire Score - 10/26/19 1011      Knowledge Questionnaire Score   Pre Score  21/24       Core Components/Risk Factors/Patient Goals at Admission: Personal Goals and Risk Factors at Admission - 10/26/19 0914      Core Components/Risk Factors/Patient Goals on Admission    Weight Management  Yes;Obesity;Weight Maintenance;Weight Loss    Intervention  Weight Management: Develop a combined nutrition and exercise program designed to reach desired caloric intake, while maintaining appropriate intake of nutrient and fiber, sodium and fats, and appropriate energy expenditure required for the weight goal.;Weight Management: Provide education and appropriate resources to help participant work on and attain dietary goals.;Weight Management/Obesity: Establish reasonable short term and long term weight goals.;Obesity: Provide education and appropriate resources to help participant work on and attain dietary goals.    Admit Weight  215 lb 6.2 oz (97.7 kg)    Expected Outcomes  Short Term: Continue to assess and modify interventions until short term weight is achieved;Long Term: Adherence to nutrition and physical activity/exercise program aimed toward attainment of established weight goal;Weight  Maintenance: Understanding of the daily nutrition guidelines, which includes 25-35% calories from fat, 7% or less cal from saturated fats, less than 200mg  cholesterol, less than 1.5gm of sodium, & 5 or more servings of fruits and vegetables daily;Weight Loss: Understanding of general recommendations for a balanced deficit meal plan, which promotes 1-2 lb weight loss per week and includes a negative energy balance of [phone] kcal/d;Understanding recommendations for meals to include 15-35% energy as protein, 25-35% energy from fat, 35-60% energy from  carbohydrates, less than 200mg  of dietary cholesterol, 20-35 gm of total fiber daily;Understanding of distribution of calorie intake throughout the day with the consumption of 4-5 meals/snacks    Hypertension  Yes    Intervention  Provide education on lifestyle modifcations including regular physical activity/exercise, weight management, moderate sodium restriction and increased consumption of fresh fruit, vegetables, and low fat dairy, alcohol moderation, and smoking cessation.;Monitor prescription use compliance.    Expected Outcomes  Long Term: Maintenance of blood pressure at goal levels.;Short Term: Continued assessment and intervention until BP is < 140/78mm HG in hypertensive participants. < 130/27mm HG in hypertensive participants with diabetes, heart failure or chronic kidney disease.    Lipids  Yes    Intervention  Provide education and support for participant on nutrition & aerobic/resistive exercise along with prescribed medications to achieve LDL 70mg , HDL >40mg .    Expected Outcomes  Short Term: Participant states understanding of desired cholesterol values and is compliant with medications prescribed. Participant is following exercise prescription and nutrition guidelines.;Long Term: Cholesterol controlled with medications as prescribed, with individualized exercise RX and with personalized nutrition plan. Value goals: LDL < 70mg , HDL > 40 mg.        Core Components/Risk Factors/Patient Goals Review:    Core Components/Risk Factors/Patient Goals at Discharge (Final Review):    ITP Comments: ITP Comments    Row Name 10/26/19 0837           ITP Comments  Dr. Fransico Him, Medical Director          Comments: Rajveer attended orientation on 10/26/2019 to review rules and guidelines for program.  Completed 6 minute walk test, Intitial ITP, and exercise prescription.  VSS. Telemetry-Sinus Rhythm.  Asymptomatic. Safety measures and social distancing in place per CDC guidelines.Barnet Pall, RN,BSN 10/26/2019 3:03 PM

## 2019-10-30 ENCOUNTER — Encounter (HOSPITAL_COMMUNITY)
Admission: RE | Admit: 2019-10-30 | Discharge: 2019-10-30 | Disposition: A | Discharge status: Home / Self Care | Payer: 59 | Source: Ambulatory Visit | Attending: Cardiovascular Disease | Admitting: Cardiovascular Disease

## 2019-10-30 ENCOUNTER — Other Ambulatory Visit: Payer: Self-pay

## 2019-10-30 DIAGNOSIS — Z951 Presence of aortocoronary bypass graft: Secondary | ICD-10-CM | POA: Diagnosis not present

## 2019-11-01 ENCOUNTER — Other Ambulatory Visit: Payer: Self-pay

## 2019-11-01 ENCOUNTER — Encounter (HOSPITAL_COMMUNITY)
Admission: RE | Admit: 2019-11-01 | Discharge: 2019-11-01 | Disposition: A | Discharge status: Home / Self Care | Payer: 59 | Source: Ambulatory Visit | Attending: Cardiovascular Disease | Admitting: Cardiovascular Disease

## 2019-11-01 DIAGNOSIS — Z951 Presence of aortocoronary bypass graft: Secondary | ICD-10-CM

## 2019-11-01 NOTE — Progress Notes (Signed)
Rajendra Stancato 73 y.o. 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 BCC (basal cell carcinoma) 10/07/2009   back of right ear-(MOHS)  . 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 Coronary artery disease   . Gout    Of the left third, fourth, and fifth MTPareas with swelling of left foot and pain  . Hyperlipidemia   . Hypertension      Medications reviewed.   Current Outpatient Medications:  .  Alirocumab (PRALUENT) 150 MG/ML SOAJ, Inject 150 mg into the skin every 14 (fourteen) days., Disp: 2 pen, Rfl: 6 .  aspirin EC 81 MG tablet, Take 81 mg by mouth daily., Disp: , Rfl:  .  benzonatate (TESSALON PERLES) 100 MG capsule, Take 1 capsule (100 mg total) by mouth 3 (three) times daily as needed for cough. (Patient not taking: Reported on 10/26/2019), Disp: 90 capsule, Rfl: 0 .  ezetimibe (ZETIA) 10 MG tablet, TAKE 1 TABLET BY MOUTH  DAILY (Patient taking differently: Take 10 mg by mouth every evening. ), Disp: 90 tablet, Rfl: 3 .  fluticasone (FLONASE) 50 MCG/ACT nasal spray, Place 2 sprays into both nostrils daily. (Patient not taking: Reported on 10/26/2019), Disp: 16 g, Rfl: 1 .  fluticasone (FLOVENT HFA) 110 MCG/ACT inhaler, Inhale into the lungs 2 (two) times daily., Disp: , Rfl:  .  furosemide (LASIX) 20 MG tablet, Take 1 tablet (20 mg total) by mouth daily., Disp: 30 tablet, Rfl: 11 .  loratadine (CLARITIN) 10 MG tablet, Take 1 tablet (10 mg total) by mouth daily., Disp: 30 tablet, Rfl: 3 .  metoprolol succinate (TOPROL-XL) 25 MG 24 hr tablet, Take 1 tablet (25 mg total) by mouth daily., Disp: 30 tablet, Rfl: 12 .  Oxymetazoline HCl (NASAL SPRAY) 0.05 % SOLN, Place 1 spray into the nose daily as needed (congestion)., Disp: , Rfl:  .  rosuvastatin (CRESTOR) 40 MG tablet, TAKE 1 TABLET BY MOUTH  DAILY (Patient taking differently: Take 40 mg by mouth every evening. ), Disp: 90  tablet, Rfl: 1   Ht Readings from Last 1 Encounters:  10/26/19 5' 7.75" (1.721 m)     Wt Readings from Last 3 Encounters:  10/26/19 215 lb 6.2 oz (97.7 kg)  10/13/19 208 lb (94.3 kg)  09/29/19 214 lb 3.2 oz (97.2 kg)     There is no height or weight on file to calculate BMI.   Social History   Tobacco Use  Smoking Status Never Smoker  Smokeless Tobacco Never Used     Lab Results  Component Value Date   CHOL 127 04/14/2019   Lab Results  Component Value Date   HDL 75 04/14/2019   Lab Results  Component Value Date   LDLCALC 35 04/14/2019   Lab Results  Component Value Date   TRIG 86 04/14/2019   Lab Results  Component Value Date   CHOLHDL 1.7 04/14/2019     Lab Results  Component Value Date   HGBA1C 6.1 (H) 08/31/2019     CBG (last 3)  No results for input(s): GLUCAP in the last 72 hours.   Nutrition Note  Spoke with pt. Nutrition Plan and Nutrition Survey goals reviewed with pt. Pt is following a Heart Healthy diet.  Pt has Pre-diabetes. Last A1c indicates blood glucose well-controlled. Pt with no formal diabetes education. Recommended carb intake for meals 60-75 g and snacks  15-30 g. Discussed fiber rich carbs.   Pt expressed understanding of the information reviewed.    Nutrition Diagnosis ? Food-and nutrition-related knowledge deficit related to lack of exposure to information as related to diagnosis of: ? CVD ? Pre-diabetes Nutrition Intervention ? Pt's individual nutrition plan reviewed with pt. ? Benefits of adopting Heart Healthy diet discussed when Medficts reviewed.   ? Continue client-centered nutrition education by RD, as part of interdisciplinary care.  Goal(s)  ? Pt to build a healthy plate including vegetables, fruits, whole grains, and low-fat dairy products in a heart healthy meal plan. ? Pt able to name foods that affect blood glucose  Plan:   Will provide client-centered nutrition education as part of interdisciplinary  care  Monitor and evaluate progress toward nutrition goal with team.   Michaele Offer, MS, RDN, LDN

## 2019-11-06 ENCOUNTER — Encounter (HOSPITAL_COMMUNITY): Payer: 59

## 2019-11-08 ENCOUNTER — Other Ambulatory Visit: Payer: Self-pay

## 2019-11-08 ENCOUNTER — Encounter (HOSPITAL_COMMUNITY)
Admission: RE | Admit: 2019-11-08 | Discharge: 2019-11-08 | Disposition: A | Discharge status: Home / Self Care | Payer: 59 | Source: Ambulatory Visit | Attending: Cardiovascular Disease | Admitting: Cardiovascular Disease

## 2019-11-08 DIAGNOSIS — Z951 Presence of aortocoronary bypass graft: Secondary | ICD-10-CM | POA: Diagnosis not present

## 2019-11-09 NOTE — Progress Notes (Signed)
Discharge Progress Report  Patient Details  Name: [name] [name] MRN: [id number] Date of Birth: 1946-02-24 Referring Provider:     CARDIAC REHAB PHASE II ORIENTATION from 10/26/2019 in [facility]  Referring Provider  Dr. Claiborne Billings       Number of Visits: 3  Reason for Discharge:  Early Exit:  Transfer to another facilty for CR Phase 2.   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 Claiborne Billings    Expected Discharge Date  12/22/19      Treadmill   MPH  2.5    Grade  1    Minutes  15      NuStep   Level  3    SPM  85    Minutes  15    METs  2.9      Prescription Details   Frequency (times per week)  3    Duration  Progress to 30 minutes of continuous aerobic without signs/symptoms of physical distress      Intensity   THRR 40-80% of Max Heartrate  58-117    Ratings of Perceived Exertion  11-13      Progression   Progression  Continue to progress workloads to maintain intensity without signs/symptoms of physical distress.      Resistance Training   Training Prescription  Yes    Weight  3 LBS.     Reps  10-15       Discharge Exercise Prescription (Final Exercise Prescription Changes): Exercise Prescription Changes - 11/08/19 1021      Response to Exercise   Blood Pressure (Admit)  116/5    Blood Pressure (Exercise)  120/68    Blood Pressure (Exit)  112/62    Heart Rate (Admit)  97 bpm    Heart Rate (Exercise)  129 bpm    Heart Rate (Exit)  94 bpm    Rating of Perceived Exertion (Exercise)  13    Perceived Dyspnea (Exercise)  0    Symptoms  None    Comments  Pt's last day of exercise due to departmental closure    Duration  Progress to  30 minutes of  aerobic without signs/symptoms of physical distress    Intensity  THRR unchanged      Progression   Progression  Continue to progress workloads to maintain intensity without signs/symptoms of physical distress.    Average METs  2.8      Resistance Training   Training Prescription  No      Treadmill   MPH  2.3    Grade  1    Minutes  15    METs  3.08      NuStep   Level  3    SPM  95    Minutes  15    METs  2.7      Home Exercise Plan   Plans to continue exercise at  [facility] (comment)    Frequency  Add 3 additional days to program exercise sessions.    Initial Home Exercises Provided  11/08/19       Functional Capacity: 6 Minute Walk    Row  Name 10/26/19 1052         6 Minute Walk   Phase  Initial     Distance  1600 feet     Walk Time  6 minutes     # of Rest Breaks  0     MPH  3     METS  3     RPE  11     Perceived Dyspnea   0     VO2 Peak  10.52     Symptoms  No     Resting HR  87 bpm     Resting BP  110/70     Resting Oxygen Saturation   96 %     Exercise Oxygen Saturation  during 6 min walk  96 %     Max Ex. HR  120 bpm     Max Ex. BP  122/70     2 Minute Post BP  110/70        Psychological, QOL, Others - Outcomes: PHQ 2/9: No flowsheet data found.  Quality of Life: Quality of Life - 10/26/19 1011      Quality of Life   Select  Quality of Life      Quality of Life Scores   Health/Function Pre  20.5 %    Socioeconomic Pre  23.57 %    Psych/Spiritual Pre  20.43 %    Family Pre  17.38 %    GLOBAL Pre  20.76 %       Personal Goals: Goals established at orientation with interventions provided to work toward goal. Personal Goals and Risk Factors at Admission - 10/26/19 0914      Core Components/Risk Factors/Patient Goals on Admission    Weight Management  Yes;Obesity;Weight Maintenance;Weight Loss    Intervention  Weight Management: Develop a combined nutrition and exercise program designed to reach desired  caloric intake, while maintaining appropriate intake of nutrient and fiber, sodium and fats, and appropriate energy expenditure required for the weight goal.;Weight Management: Provide education and appropriate resources to help participant work on and attain dietary goals.;Weight Management/Obesity: Establish reasonable short term and long term weight goals.;Obesity: Provide education and appropriate resources to help participant work on and attain dietary goals.    Admit Weight  215 lb 6.2 oz (97.7 kg)    Expected Outcomes  Short Term: Continue to assess and modify interventions until short term weight is achieved;Long Term: Adherence to nutrition and physical activity/exercise program aimed toward attainment of established weight goal;Weight Maintenance: Understanding of the daily nutrition guidelines, which includes 25-35% calories from fat, 7% or less cal from saturated fats, less than 200mg  cholesterol, less than 1.5gm of sodium, & 5 or more servings of fruits and vegetables daily;Weight Loss: Understanding of general recommendations for a balanced deficit meal plan, which promotes 1-2 lb weight loss per week and includes a negative energy balance of [id number] kcal/d;Understanding recommendations for meals to include 15-35% energy as protein, 25-35% energy from fat, 35-60% energy from carbohydrates, less than 200mg  of dietary cholesterol, 20-35 gm of total fiber daily;Understanding of distribution of calorie intake throughout the day with the consumption of 4-5 meals/snacks    Hypertension  Yes    Intervention  Provide education on lifestyle modifcations including regular physical activity/exercise, weight management, moderate sodium restriction and increased consumption of fresh fruit, vegetables, and low fat dairy, alcohol moderation, and smoking cessation.;Monitor prescription use compliance.    Expected Outcomes  Long Term: Maintenance of blood pressure at goal levels.;Short Term:  Continued assessment  and intervention until BP is < 140/61mm HG in hypertensive participants. < 130/13mm HG in hypertensive participants with diabetes, heart failure or chronic kidney disease.    Lipids  Yes    Intervention  Provide education and support for participant on nutrition & aerobic/resistive exercise along with prescribed medications to achieve LDL 70mg , HDL >40mg .    Expected Outcomes  Short Term: Participant states understanding of desired cholesterol values and is compliant with medications prescribed. Participant is following exercise prescription and nutrition guidelines.;Long Term: Cholesterol controlled with medications as prescribed, with individualized exercise RX and with personalized nutrition plan. Value goals: LDL < 70mg , HDL > 40 mg.        Personal Goals Discharge: Goals and Risk Factor Review    Row Name 10/30/19 1053 11/09/19 1034           Core Components/Risk Factors/Patient Goals Review   Personal Goals Review  Weight Management/Obesity;Hypertension;Lipids  Weight Management/Obesity;Hypertension;Lipids      Review  Pt with few CAD RFs willing to participate in CR exercise.  Zacari would like to get back to golfing.  Pt with few CAD RFs willing to participate in CR exercise.  Chaske continues to tolerate exercise well.   At this time the CR program is halting in person exercise d/t COVID-19 pandemic.  [place] has arranged to transfer his in person exercise to [facility]: [facility].  He will be discharged from this program.      Expected Outcomes  Fannie will continue to participate in CR exercise, nutrition, and lifestyle modification opportunities.  Jibril will continue to participate in CR exercise, nutrition, and lifestyle modification opportunities at [facility].         Exercise Goals and Review: Exercise Goals    Row Name 10/26/19 [phone]             Exercise Goals   Increase Physical Activity  Yes       Intervention  Provide advice, education,  support and counseling about physical activity/exercise needs.;Develop an individualized exercise prescription for aerobic and resistive training based on initial evaluation findings, risk stratification, comorbidities and participant's personal goals.       Expected Outcomes  Short Term: Attend rehab on a regular basis to increase amount of physical activity.;Long Term: Add in home exercise to make exercise part of routine and to increase amount of physical activity.;Long Term: Exercising regularly at least 3-5 days a week.       Increase Strength and Stamina  Yes       Intervention  Provide advice, education, support and counseling about physical activity/exercise needs.;Develop an individualized exercise prescription for aerobic and resistive training based on initial evaluation findings, risk stratification, comorbidities and participant's personal goals.       Expected Outcomes  Short Term: Increase workloads from initial exercise prescription for resistance, speed, and METs.;Short Term: Perform resistance training exercises routinely during rehab and add in resistance training at home;Long Term: Improve cardiorespiratory fitness, muscular endurance and strength as measured by increased METs and functional capacity (6MWT)       Able to understand and use rate of perceived exertion (RPE) scale  Yes       Intervention  Provide education and explanation on how to use RPE scale       Expected Outcomes  Short Term: Able to use RPE daily in rehab to express subjective intensity level;Long Term:  Able to use RPE to guide intensity level when exercising independently  Knowledge and understanding of Target Heart Rate Range (THRR)  Yes       Intervention  Provide education and explanation of THRR including how the numbers were predicted and where they are located for reference       Expected Outcomes  Short Term: Able to state/look up THRR;Long Term: Able to use THRR to govern intensity when exercising  independently;Short Term: Able to use daily as guideline for intensity in rehab       Able to check pulse independently  Yes       Intervention  Provide education and demonstration on how to check pulse in carotid and radial arteries.;Review the importance of being able to check your own pulse for safety during independent exercise       Expected Outcomes  Short Term: Able to explain why pulse checking is important during independent exercise;Long Term: Able to check pulse independently and accurately       Understanding of Exercise Prescription  Yes       Intervention  Provide education, explanation, and written materials on patient's individual exercise prescription       Expected Outcomes  Short Term: Able to explain program exercise prescription;Long Term: Able to explain home exercise prescription to exercise independently          Exercise Goals Re-Evaluation:   Nutrition & Weight - Outcomes: Pre Biometrics - 10/26/19 1057      Pre Biometrics   Height  5' 7.75" (1.721 m)    Weight  97.7 kg    Waist Circumference  39.5 inches    Hip Circumference  46 inches    Waist to Hip Ratio  0.86 %    BMI (Calculated)  32.99    Triceps Skinfold  20 mm    % Body Fat  30.7 %    Grip Strength  44 kg    Flexibility  12.75 in    Single Leg Stand  30 seconds        Nutrition: Nutrition Therapy & Goals - 11/01/19 1052      Nutrition Therapy   Diet  Heart healthy/Carb modified    Drug/Food Interactions  Statins/Certain Fruits      Personal Nutrition Goals   Nutrition Goal  Pt to build a healthy plate including vegetables, fruits, whole grains, and low-fat dairy products in a heart healthy meal plan.    Personal Goal #2  Pt able to name foods that affect blood glucose      Intervention Plan   Intervention  Prescribe, educate and counsel regarding individualized specific dietary modifications aiming towards targeted core components such as weight, hypertension, lipid management, diabetes,  heart failure and other comorbidities.;Nutrition handout(s) given to patient.    Expected Outcomes  Short Term Goal: A plan has been developed with personal nutrition goals set during dietitian appointment.;Long Term Goal: Adherence to prescribed nutrition plan.       Nutrition Discharge: Nutrition Assessments - 11/01/19 1059      MEDFICTS Scores   Pre Score  26       Education Questionnaire Score: Knowledge Questionnaire Score - 10/26/19 1011      Knowledge Questionnaire Score   Pre Score  21/24       Goals reviewed with patient; copy given to patient.

## 2019-11-13 ENCOUNTER — Ambulatory Visit (HOSPITAL_COMMUNITY): Payer: 59

## 2019-11-15 ENCOUNTER — Ambulatory Visit (HOSPITAL_COMMUNITY): Payer: 59

## 2019-11-17 ENCOUNTER — Ambulatory Visit (HOSPITAL_COMMUNITY): Payer: 59

## 2019-11-20 ENCOUNTER — Ambulatory Visit (HOSPITAL_COMMUNITY): Payer: 59

## 2019-11-22 ENCOUNTER — Ambulatory Visit (HOSPITAL_COMMUNITY): Payer: 59

## 2019-11-24 ENCOUNTER — Ambulatory Visit (HOSPITAL_COMMUNITY): Payer: 59

## 2019-11-27 ENCOUNTER — Ambulatory Visit (HOSPITAL_COMMUNITY): Payer: 59

## 2019-11-29 ENCOUNTER — Ambulatory Visit (HOSPITAL_COMMUNITY): Payer: 59

## 2019-12-01 ENCOUNTER — Ambulatory Visit (HOSPITAL_COMMUNITY): Payer: 59

## 2019-12-04 ENCOUNTER — Ambulatory Visit (HOSPITAL_COMMUNITY): Payer: 59

## 2019-12-06 ENCOUNTER — Ambulatory Visit (HOSPITAL_COMMUNITY): Payer: 59

## 2019-12-08 ENCOUNTER — Ambulatory Visit (HOSPITAL_COMMUNITY): Payer: 59

## 2019-12-11 ENCOUNTER — Ambulatory Visit (HOSPITAL_COMMUNITY): Payer: 59

## 2019-12-11 ENCOUNTER — Ambulatory Visit: Payer: 59

## 2019-12-13 ENCOUNTER — Ambulatory Visit (HOSPITAL_COMMUNITY): Payer: 59

## 2019-12-15 ENCOUNTER — Ambulatory Visit (HOSPITAL_COMMUNITY): Payer: 59

## 2019-12-17 ENCOUNTER — Ambulatory Visit: Payer: 59

## 2019-12-18 ENCOUNTER — Ambulatory Visit (HOSPITAL_COMMUNITY): Payer: 59

## 2019-12-20 ENCOUNTER — Ambulatory Visit (HOSPITAL_COMMUNITY): Payer: 59

## 2019-12-22 ENCOUNTER — Ambulatory Visit (HOSPITAL_COMMUNITY): Payer: 59

## 2020-01-01 ENCOUNTER — Ambulatory Visit: Payer: 59

## 2020-01-01 LAB — LIPID PANEL
Chol/HDL Ratio: 1.4 ratio (ref 0.0–5.0)
Cholesterol, Total: 115 mg/dL (ref 100–199)
HDL: 80 mg/dL (ref >39)
LDL Chol Calc (NIH): 19 mg/dL (ref 0–99)
Triglycerides: 82 mg/dL (ref 0–149)
VLDL Cholesterol Cal: 16 mg/dL (ref 5–40)

## 2020-01-01 LAB — CBC
Hematocrit: 41.7 % (ref 37.5–51.0)
Hemoglobin: 13.4 g/dL (ref 13.0–17.7)
MCH: 30.9 pg (ref 26.6–33.0)
MCHC: 32.1 g/dL (ref 31.5–35.7)
MCV: 96 fL (ref 79–97)
Platelets: 162 10*3/uL (ref 150–450)
RBC: 4.33 x10E6/uL (ref 4.14–5.80)
RDW: 15.5 % — ABNORMAL HIGH (ref 11.6–15.4)
WBC: 5.5 10*3/uL (ref 3.4–10.8)

## 2020-01-01 LAB — COMPREHENSIVE METABOLIC PANEL
ALT: 21 IU/L (ref 0–44)
AST: 30 IU/L (ref 0–40)
Albumin/Globulin Ratio: 1.9 (ref 1.2–2.2)
Albumin: 4.4 g/dL (ref 3.7–4.7)
Alkaline Phosphatase: 56 IU/L (ref 39–117)
BUN/Creatinine Ratio: 11 (ref 10–24)
BUN: 11 mg/dL (ref 8–27)
Bilirubin Total: 0.3 mg/dL (ref 0.0–1.2)
CO2: 24 mmol/L (ref 20–29)
Calcium: 9.4 mg/dL (ref 8.6–10.2)
Chloride: 102 mmol/L (ref 96–106)
Creatinine, Ser: 1.02 mg/dL (ref 0.76–1.27)
GFR calc Af Amer: 83 mL/min/{1.73_m2} (ref >59)
GFR calc non Af Amer: 72 mL/min/{1.73_m2} (ref >59)
Globulin, Total: 2.3 g/dL (ref 1.5–4.5)
Glucose: 73 mg/dL (ref 65–99)
Potassium: 4.8 mmol/L (ref 3.5–5.2)
Sodium: 141 mmol/L (ref 134–144)
Total Protein: 6.7 g/dL (ref 6.0–8.5)

## 2020-01-01 LAB — TSH: TSH: 2.63 u[IU]/mL (ref 0.450–4.500)

## 2020-01-02 ENCOUNTER — Ambulatory Visit: Payer: 59 | Admitting: Cardiovascular Disease

## 2020-01-02 ENCOUNTER — Other Ambulatory Visit: Payer: Self-pay

## 2020-01-02 VITALS — BP 112/80 | HR 90 | Temp 97.5°F | Ht 67.75 in | Wt 220.8 lb

## 2020-01-02 DIAGNOSIS — R600 Localized edema: Secondary | ICD-10-CM

## 2020-01-02 DIAGNOSIS — I10 Essential (primary) hypertension: Secondary | ICD-10-CM | POA: Diagnosis not present

## 2020-01-02 DIAGNOSIS — I25110 Atherosclerotic heart disease of native coronary artery with unstable angina pectoris: Secondary | ICD-10-CM

## 2020-01-02 DIAGNOSIS — Z951 Presence of aortocoronary bypass graft: Secondary | ICD-10-CM | POA: Diagnosis not present

## 2020-01-02 DIAGNOSIS — E7849 Other hyperlipidemia: Secondary | ICD-10-CM | POA: Diagnosis not present

## 2020-01-02 DIAGNOSIS — E6609 Other obesity due to excess calories: Secondary | ICD-10-CM

## 2020-01-02 DIAGNOSIS — Z6833 Body mass index (BMI) 33.0-33.9, adult: Secondary | ICD-10-CM

## 2020-01-02 NOTE — Patient Instructions (Signed)
Medication Instructions:  CONTINUE WITH CURRENT MEDICATIONS. NO CHANGES.  *If you need a refill on your cardiac medications before your next appointment, please call your pharmacy*  Follow-Up: At [facility], you and your health needs are our priority.  As part of our continuing mission to provide you with exceptional heart care, we have created designated Provider Care Teams.  These Care Teams include your primary Cardiologist (physician) and Advanced Practice Providers (APPs -  Physician Assistants and Nurse Practitioners) who all work together to provide you with the care you need, when you need it.  Your next appointment:   6 month(s)  The format for your next appointment:   In Person  Provider:   Thomas Kelly, MD    

## 2020-01-02 NOTE — Progress Notes (Signed)
Patient ID: [name] [name], male   DOB: 1946-05-28, 74 y.o.   MRN: [id number]     HPI:  [name] [name] is a 74 y.o. male who is a former patient of Dr. Rollene Fare.  He presents for 30-monthfollow-up evaluation.  Mr. ODaxhas a long-standing history of familial hyperlipidemia.  His father and one of his brothers had familial hyperlipidemia.   His father died at age 3936secondary to advanced coronary artery disease.  [name] [name] in the past had been as high as 400.  He was initially diagnosed in the 1970s.  He has participated in numerous drug studies over the years and was one of the precipitants in the initial statin trials with Mevacor.  As part of one of his studies[place]  [place] was asymptomatic with chest pain but was found to have mild coronary obstructive disease with 20-40% RCA stenoses, 20-30% circumflex stenoses,  At least moderate LAD stenosis with poststenotic dilatation involving the diagonal and septal perforating artery in 1987.  He has not been on medical therapy for CAD, with the exception of aggressive lipid-lowering treatment.  His last nuclear perfusion study was in 2012, which continue to show normal perfusion.  There was attenuation artifact.  Post-rest ejection fraction was 53%. In June 2014 an echo Doppler study  showed an ejection fraction of 55-60%; Normal diastolic parameters.  He had mild left atrial dilatation.  There was mild aortic valve sclerosis without stenosis.  The patient tells me he has been aggressive with his follow-up.  He typically has laboratory checked via life extension on his own. Remotely, he also underwent VAP testing to determine particles size and numbers.  H e has been on Zetia 10 mg Crestor 40 mg, Niaspan 2000 mg and 2000 mg of omega-3 fatty acids.  I did review blood work that he had had in April 2015 from life extension and at that time on aggressive medical therapy.  His total cholesterol  was 185, triglycerides 59, HDL cholesterol 82, LDL cholesterol 91.  Homocysteine was 11.7.  Hemoglobin A1c was 5.7.  Thyroid function studies were normal.  He was not anemic.  He had normal renal function.  He participated in this study looking at possible side effects from the injection site for PCSK9 inhibitor therapy.  He tells me his brother  underwent stenting of his coronary artery age 216  The patient also has a history of gout, and hypertension.  He has been tolerating my Micardis 80 mg without side effects.  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  He denies chest pain.  He denies PND, orthopnea.  He denies palpitations.  In February 2016 he had an NMR LipoProfile.  Total cholesterol was 185, triglycerides 72, HDL 94, LDL C 77, and LDL particle number was excellent at 683.  Insulin resistance score was 31.  In March 2016  a nuclear perfusion study continued  to show normal perfusion and was low risk with probable bowel attenuation artifact.  As part of his own blood testing he had another lipid panel in August 2016 which showed a total cholesterol 140, HDL 50, LDL 77, and very normal triglyceride levels.  He had purposeful weight loss of 30 pounds.  He tells me he had undergone hip surgery by Dr. TLonzo Cloudin CJane SAzusa  He underwent an NMR LipoProfile on 12/20/2015.  Total cholesterol is 153, HDL 76, triglycerides 45, and calculated LDL was 68.  He had 1241,  LDL particles which were predominantly large.  There were only 196 small LDL particles.  April lipoprotein B was normal at 61 as was LPa at 14.  He again had his labs rechecked as part of life extension, blood testing in June 2017.  He has a remote history of gout and has been taking a you.  All and uric acid was 3.3.  Renal function was normal.  He had normal LFTs.  Lipid studies revealed a total cholesterol of 167, triglycerides 46, HDL 85, and LDL 73.  Homocysteine was normal at 9.5.  PSA was 0.8.  Testosterone was low at  297.  Hemoglobin A1c was excellent at 5.3.  TSH was normal.  He was not anemic with hemoglobin of 13.5, hematocrit of 42.3.  MCV was minimally increased at 102.  Vitamin D level was normal at 95.7.  He had been taking vitamin D 10,000 units daily supplementation  Since I last saw him one year ago, he has remained active.  Over the past year he remains active.  He plays golf at least 2 days per week.  days a week.  He walks several other days.  He continues to work as a Engineer, petroleum.  He is widowed and not sexually active.   He underwent follow-up laboratory on 04/14/2017 at life extension.  BUN 16, creatinine 0.96.  Total cholesterol 189, triglycerides 71, HDL 89, LDL 86.  Homocysteine 14.5.  C-reactive protein 2.1.  Hemoglobin 14.1/hematocrit 42.8.  Serum testosterone 196.  TSH 2.6.  Vitamin D 50.2.  Apo lipoprotein B 81.    When I  saw him in December 2018, I had a long discussion with reference to PCSK 9 inhibition in light of his familial hyperlipidemia.  He has since been started on Praluent 150 mg every 2-week injections and had received 2 months of treatment.  Follow-up laboratory 2 days ago has shown a total cholesterol of 139, triglycerides 66, HDL 87, and LDL 39.  He has not had any side  effects to the medication.  His insurance company had denied Repatha.  He continues to be on concomitant therapy with Zetia 10 mg and rosuvastatin 40 mg.  He also is on Toprol-XL 25 mg and telmisartan 80 mg for hypertension.  He continues to work in a Scientist, physiological.  He recently returned from a two-week trip to [place] to visit his son who lives there.    I saw him in April 2019.  He continued to be asymptomatic with reference to chest pain, and did not have any change in exercise tolerance.  Lipid studies at that time revealed a total cholesterol 139, triglycerides 66, HDL 87 and LDL cholesterol was 39.  On his own, he underwent laboratory by life extension in August 2019.  Chemistry  was normal.  Fasting glucose was 83.  Total cholesterol 114, triglycerides 42, HDL 81, and LDL was 25.  Homocystine was normal.  Vitamin D level was 63.2.  Insulin level was normal at 5.7.  Apo lipoprotein B was excellent at 32.  Life line screening screening was done by his own accord which he typically gets done every several years.  This now has shown improvement from previous evaluations with carotids now being totally normal without previous plaque.  His abdominal aorta was normal, there was no PVD.  Remains active.  He plays golf 2 times per week at a minimum.  He admits to weight gain over the holidays.   When I  saw him  in the office in January 2020, he was remaining fairly stable.  However, over the past 6 months he has noticed some mild gradual increase in exertional shortness of breath.  He saw his primary physician, Dr. Hulan Fess and due to his exertional component of dyspnea he referred him back to our office and he saw Dr. Grayling Congress on July 10, 2019 for further evaluation.  An echo Doppler study on July 11, 2019 revealed EF at 60 to 65% with grade 1 diastolic dysfunction.  He was referred for a nuclear perfusion study which was done with a Lexiscan protocol and this showed clear change from his prior evaluations and was interpreted as high risk with transient ischemic dilatation, EF 39%, and ischemia in several distributions. There was significant discussion with Dr. Davina Poke and an attempt at scheduling him for coronary CTA was unsuccessful.  After much discussion the decision was made to proceed with cardiac catheterization which initially the patient had some reservation.  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  Apparently his angiographic data was reviewed at [facility] and decision was not to proceed with intervention or bypass at that time and he has been  on medical therapy for over 30 years.    I saw him for telemedicine evaluation on August 30, 2019 prior to undergoing his cardiac catheterization which was scheduled for the following day.  At that time, he told me that since he was  notified of his abnormal results on his own he increased aspirin from 81 mg up to 325 mg and he had old prescription of Plavix which had been prescribed by Dr. Rollene Fare approximately 8 years ago.  For the past several weeks he put himself back on his oral Plavix medication and he stated that he had checked on the Internet and felt that even though it may not be as potent as it may have been when initially prescribed to him he felt that this provided some additional coverage until his catheterization.  Cardiac catheterization was performed by me on August 31 2019.  This revealed severe native CAD with 80% calcified distal left main stenosis, 99% ostial LAD stenosis with total occlusion of the LAD after the proximal diagonal and septal perforating artery, 80% ostial circumflex stenosis followed by 80% proximal stenosis with extensive collateralization to a dominant RCA from the circumflex as well as faint collaterals to the distal LAD and total proximal RCA occlusion.  He underwent urgent surgical consultation and on September 01, 2019 he underwent successful CABG revascularization with a LIMA to the LAD, vein to the diagonal 1 and OM 2 and PLV with endoscopic vein harvesting from the right lower extremity.  He received IV Lasix for fluid overload.  He was discharged on September 06, 2019.  During his hospitalization prolonged, hydrochlorothiazide, Plavix, and telmisartan were discontinued prior to discharge due to bradycardia and hypotension.  He was evaluated by Bunnie Domino on September 18, 2019 and had URI symptoms and fever.  A chest x-ray did not reveal pneumonia and he tested negative for Covid, flu, and srep.  He has subsequently been evaluated by a physician and his  primary care's office and has undergone several courses of antibiotics including Augmentin and doxycycline, but most recently levofloxacin.  His fever has resolved and he is feeling better but he still admits to occasional cough.   I saw him for his initial  post hospital evaluation with me on September 29, 2019.  He has not had any pulmonary evaluation.  From a cardiac perspective, he admits to residual swelling of his right lower extremity.  He had been on Lasix 40 mg daily but this was discontinued approximately 3 weeks ago after he had seen Dr. Kipp Brood who performed his CABG revascularization surgery.  He continues to be on Praluent 150 mg every 2 weeks, rosuvastatin 40 mg and Zetia 10 mg for his hereditary hyperlipidemia.  He is unaware of palpitations.  He denies presyncope or syncope.  During that evaluation, he had tense lower extremity edema.  I recommended reinstitution of furosemide initially at 20 mg for the next 5 to 7 days then potentially as needed.  I change his metoprolol to succinate 25 mg daily.  He was on Praluent 150 mg, rosuvastatin and Zetia for familial hyperlipidemia.  Since I last saw him, he is remained stable over the past 3 months.  He had significant benefit from initial furosemide and now has been taking 20 mg 2 times per week.  However he still experiences mild right lower edema.  Since his CABG revascularization, he notes improvement in previous exertional dyspnea.  He had never experienced significant chest pressure.  He has been participating in hard strides cardiac rehab at [facility] which will be for 3 months duration.  He underwent repeat laboratory on January 01, 2020.  Total cholesterol is now 115, triglycerides 82, HDL 80, and LDL cholesterol 19 on his PCSK9, high potency statin and Zetia therapy.  He denies any palpitations.  He denies presyncope or syncope.  He presents for evaluation.   Past Medical History:  Diagnosis Date  . BCC (basal cell carcinoma)  10/07/2009   back of right ear-([facility])  . 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 Coronary artery disease   . Gout    Of the left third, fourth, and fifth MTPareas with swelling of left foot and pain  . Hyperlipidemia   . Hypertension     Past Surgical History:  Procedure Laterality Date  . CARDIAC CATHETERIZATION  01/04/1986  . CORONARY ARTERY BYPASS GRAFT N/A 09/01/2019   Procedure: CORONARY ARTERY BYPASS GRAFTING (CABG) x Four , using left internal mammary artery and right leg greater saphenous vein harvested endoscopically;  Surgeon: Lajuana Matte, MD;  Location: [place];  Service: Open Heart Surgery;  Laterality: N/A;  . LEFT HEART CATH AND CORONARY ANGIOGRAPHY N/A 08/31/2019   Procedure: LEFT HEART CATH AND CORONARY ANGIOGRAPHY;  Surgeon: Troy Sine, MD;  Location: [place] [facility];  Service: Cardiovascular;  Laterality: N/A;  . TEE WITHOUT CARDIOVERSION N/A 09/01/2019   Procedure: Transesophageal Echocardiogram (Tee);  Surgeon: Lajuana Matte, MD;  Location: [place];  Service: Open Heart Surgery;  Laterality: N/A;    No Known Allergies  Current Outpatient Medications  Medication Sig Dispense Refill  . Alirocumab (PRALUENT) 150 MG/ML SOAJ Inject 150 mg into the skin every 14 (fourteen) days. 2 pen 6  . aspirin EC 81 MG tablet Take 81 mg by mouth daily.    Marland Kitchen ezetimibe (ZETIA) 10 MG tablet TAKE 1 TABLET BY MOUTH  DAILY (Patient taking differently: Take 10 mg by mouth every evening. ) 90 tablet 3  . furosemide (LASIX) 20 MG tablet Take 1 tablet (20 mg total) by mouth daily. 30 tablet 11  . loratadine (CLARITIN) 10 MG tablet Take 1 tablet (10 mg  total) by mouth daily. 30 tablet 3  . metoprolol succinate (TOPROL-XL) 25 MG 24 hr tablet Take 1 tablet (25 mg total) by mouth daily. 30 tablet 12  . Oxymetazoline HCl (NASAL SPRAY) 0.05 % SOLN Place 1 spray into the nose daily as needed (congestion).    . rosuvastatin  (CRESTOR) 40 MG tablet TAKE 1 TABLET BY MOUTH  DAILY (Patient taking differently: Take 40 mg by mouth every evening. ) 90 tablet 1  . telmisartan (MICARDIS) 80 MG tablet Take 80 mg by mouth daily.     No current facility-administered medications for this visit.    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  He is married and has 2 children, ages 43 and 51.  He has 2 grandchildren.  There is no history of tobacco use.  He does not drink alcohol.  Family history is notable that his mother died at age 29 with old age.  Father died at age 71 with advanced atherosclerosis.  He has one brother who is status post coronary stenting.  ROS General: Negative; No fevers, chills, or night sweats HEENT: Negative; No changes in vision or hearing, sinus congestion, difficulty swallowing Pulmonary: Negative; No cough, wheezing, shortness of breath, hemoptysis Cardiovascular:  See HPI; No chest pain, presyncope, syncope, palpitations,  Hives he may note some trace ankle swelling GI: Negative; No nausea, vomiting, diarrhea, or abdominal pain GU: Negative; No dysuria, hematuria, or difficulty voiding Musculoskeletal: Negative; no myalgias, joint pain, or weakness Hematologic/Oncologic: Negative; no easy bruising, bleeding Neurologic: Positive for gout Endocrine: Negative; no heat/cold intolerance; no diabetes Neuro: Negative; no changes in balance, headaches Skin: Osteophytic for chronic rash  Psychiatric: Negative; No behavioral problems, depression Sleep: Negative; No daytime sleepiness, hypersomnolence, bruxism, restless legs, hypnogagnic hallucinations Other comprehensive 14 point system review is negative   Physical Exam BP 112/80   Pulse 90   Temp (!) 97.5 F (36.4 C)   Ht 5' 7.75" (1.721 m)   Wt 220 lb 12.8 oz (100.2 kg)   SpO2 95%   BMI 33.82 kg/m    Repeat blood pressure by me was 122/70.  Wt Readings from Last 3 Encounters:  01/02/20 220 lb  12.8 oz (100.2 kg)  10/26/19 215 lb 6.2 oz (97.7 kg)  10/13/19 208 lb (94.3 kg)   General: Alert, oriented, no distress.  Skin: normal turgor, no rashes, warm and dry HEENT: Normocephalic, atraumatic. Pupils equal round and reactive to light; sclera anicteric; extraocular muscles intact;  Nose without nasal septal hypertrophy Mouth/Parynx benign; Mallinpatti scale 3 Neck: No JVD, no carotid bruits; normal carotid upstroke Lungs: clear to ausculatation and percussion; no wheezing or rales Chest wall: without tenderness to palpitation Heart: PMI not displaced, RRR, s1 s2 normal, 1/6 systolic murmur, no diastolic murmur, no rubs, gallops, thrills, or heaves Abdomen: soft, nontender; no hepatosplenomehaly, BS+; abdominal aorta nontender and not dilated by palpation. Back: no CVA tenderness Pulses 2+ Musculoskeletal: full range of motion, normal strength, no joint deformities Extremities: Significant improvement in previous tense lower extremity edema.  However there still is mild edema right lower extremity above the sock line.  No edema in the left leg no clubbing, cyanosis,  Homan's sign negative  Neurologic: grossly nonfocal; Cranial nerves grossly wnl Psychologic: Normal mood and affect  ECG (independently read by me): Normal sinus rhythm at 81 bpm.  No ectopy.  Normal intervals.  Mild RV conduction delay   September 29, 2019 ECG (independently read by me): Normal  sinus rhythm at 70 bpm.  Borderline first-degree AV block with a PR level 206 ms.  Nonspecific T wave abnormality in V1 and V2  January 2020 ECG (independently read by me): Sinus rhythm at 81 bpm.  Mild first-degree AV block with a PR of 206 ms.  Nonspecific T changes.  02/11/2018 ECG (independently read by me): Sinus rhythm at 60 bpm.  First-degree AV block.  No ectopy.  December 2018 ECG (independently read by me): Normal sinus rhythm at 72 bpm.  Early transition.  First-degree AV block with a PR interval of 206 ms.  October  2016 ECG (independently read by me): Normal sinus rhythm at 62.  No ectopy.  No ST segment changes.  ECG (independently read by me): Normal sinus rhythm at 71 bpm.  No ectopy.  QTc interval 441 ms  LABS: BMP Latest Ref Rng & Units 01/01/2020 09/18/2019 09/05/2019  Glucose 65 - 99 mg/dL 73 98 101(H)  BUN 8 - 27 mg/dL 11 8 9   Creatinine 0.76 - 1.27 mg/dL 1.02 0.97 0.89  BUN/Creat Ratio 10 - 24 11 8(L) -  Sodium 134 - 144 mmol/L 141 142 138  Potassium 3.5 - 5.2 mmol/L 4.8 4.1 3.8  Chloride 96 - 106 mmol/L 102 101 104  CO2 20 - 29 mmol/L 24 24 27   Calcium 8.6 - 10.2 mg/dL 9.4 9.3 8.1(L)   Hepatic Function Latest Ref Rng & Units 01/01/2020 08/31/2019 04/14/2019  Total Protein 6.0 - 8.5 g/dL 6.7 5.9(L) 6.1  Albumin 3.7 - 4.7 g/dL 4.4 3.8 4.4  AST 0 - 40 IU/L 30 24 29   ALT 0 - 44 IU/L 21 19 20   Alk Phosphatase 39 - 117 IU/L 56 35(L) 40  Total Bilirubin 0.0 - 1.2 mg/dL 0.3 0.7 0.4  Bilirubin, Direct 0.00 - 0.40 mg/dL - - 0.13   CBC Latest Ref Rng & Units 01/01/2020 09/18/2019 09/07/2019  WBC 3.4 - 10.8 x10E3/uL 5.5 6.0 -  Hemoglobin 13.0 - 17.7 g/dL 13.4 9.4(L) 8.3(L)  Hematocrit 37.5 - 51.0 % 41.7 29.1(L) 24.9(L)  Platelets 150 - 450 x10E3/uL 162 345 -   Lab Results  Component Value Date   MCV 96 01/01/2020   MCV 96 09/18/2019   MCV 99.5 09/05/2019   Lab Results  Component Value Date   TSH 2.630 01/01/2020   Lipid Panel     Component Value Date/Time   CHOL 115 01/01/2020 1029   CHOL 153 12/20/2015 0949   TRIG 82 01/01/2020 1029   TRIG 45 12/20/2015 0949   TRIG 72 12/06/2014 0944   HDL 80 01/01/2020 1029   HDL 76 12/20/2015 0949   HDL 94 12/06/2014 0944   CHOLHDL 1.4 01/01/2020 1029   CHOLHDL 2.0 12/20/2015 0949   LDLCALC 19 01/01/2020 1029   LDLCALC 68 12/20/2015 0949   RADIOLOGY: No results found.  CARDIAC STUDIES:  Ost RCA to Prox RCA lesion is 100% stenosed.  Dist LM to Prox LAD lesion is 99% stenosed.  Ost Cx lesion is 80% stenosed.  Prox LAD to Mid LAD  lesion is 100% stenosed.  Mid LM lesion is 80% stenosed.  Prox Cx lesion is 80% stenosed.   Severe  native coronary artery disease with 80% calcified distal left main stenosis; 99% ostial LAD stenosis with total occlusion of the LAD after the proximal diagonal and septal perforating artery; 80% ostial circumflex stenosis followed by 80% proximal stenosis with extensive collateralization to a dominant RCA from the circumflex vessel as well as faint collaterals to the  distal LAD, and total proximal RCA occlusion.  LVEDP 14 mm  RECOMMENDATION:  Urgent Surgical consultation will be obtained for CABG revascularization.  A stat P2Y12 blood test was sent from the laboratory to assess the antiplatelet effect of his self prescribed 8 years outdated Plavix with his last dose 2 days ago.   Intervention   IMPRESSION:  1. Coronary artery disease involving native coronary artery of native heart with unstable angina pectoris ([place])   2. S/P CABG x 4   3. Essential hypertension   4. Familial hyperlipidemia   5. Lower extremity edema   6. Class 1 obesity due to excess calories with serious comorbidity and body mass index (BMI) of 33.0 to 33.9 in adult     ASSESSMENT AND PLAN: Mr. Quintavius Niebuhr is a 74 year old gentleman who has a history of heterozygous familial hyperlipidemia and was initially noted to have cholesterols in excess of 400 in the 1970s.  His father and brother also has similar history.  His 2 children do not have this disorder.  He has been on aggressive lipid-lowering therapy ever since statins were initially introduced and had participated in the early clinical trials of lovastatin and simvastatin.  He had previously been demonstrated to have coronary atherosclerosis by cardiac catheterization in the 1980s.  In 2016 prior to undergoing hip surgery a four-year follow-up nuclear perfusion study continued to be low risk and did not reveal any significant perfusion abnormalities.  His  familial hyperlipidemia has beentreated with Praluent 150 mg every 14 days, rosuvastatin 40 mg, Zetia 10 mg in addition to omega-3 fatty acid.  When I had  seen him I discontinued niacin.  Lipid studies were outstanding and most recent evaluation showed a total cholesterol 114 with an LDL cholesterol of 25.  Triglycerides were 42 with HDL 81.  Apo lipoprotein B was 32.  Over the late spring and early summer, Mr. O'Meara developed progressive exertional dyspnea.  An echo Doppler study on September 11/2018 showed an EF at 60 to 65%.  A nuclear perfusion study was very high risk and EF post-rest was 39% and there was evidence for transient ischemic dilatation with ischemia in several vascular distributions.  I performed cardiac catheterization which revealed life-threatening anatomy as noted above and the following day he underwent urgent coronary revascularization by Dr. Kipp Brood successfully.    He had had issues with fever increased cough and shortness of breath and had several courses of antibiotic therapy with improvement.   He tested negative for Covid.  I saw him for my initial post CABG evaluation, he had significant tense lower extremity edema.  This has significantly improved with institution of Lasix initially 20 mg daily.  Recently he has only been taking this 2 times per day.  He continues to have very mild edema above his sock line in his right lower extremity.  I have suggested he increase furosemide to 20 mg every other day and take on the off day if edema remains.  His previous exertional dyspnea has resolved.  He no longer has a cough.  He is now participating in cardiac rehab through [facility].  He is not having any chest tightness.  I reviewed laboratory from yesterday which show an excellent LDL at 19.  Prior to his revascularization, his LDL cholesterol was 35 in June 2020.  CBC is stable.  Renal function is stable with creatinine 1.02.  He continues to be on Toprol-XL 25 mg  daily and telmisartan 80 mg.  Blood pressure remained stable on therapy.  He will continue with present dose rosuvastatin 40 mg, Zetia 10 mg in addition to Praluent 150 mg daily.  He is on aspirin.  BMI is 33.82.  Weight loss is recommended.  I will see him in 6 months for reevaluation or sooner as necessary.   Troy Sine, MD, [facility] 01/03/2020 7:58 AM

## 2020-01-03 ENCOUNTER — Encounter: Payer: Self-pay | Admitting: Cardiovascular Disease

## 2020-01-29 ENCOUNTER — Other Ambulatory Visit: Payer: Self-pay | Admitting: Cardiovascular Disease

## 2020-02-07 ENCOUNTER — Other Ambulatory Visit: Payer: Self-pay | Admitting: Cardiovascular Disease

## 2020-02-26 ENCOUNTER — Other Ambulatory Visit: Payer: Self-pay | Admitting: Cardiovascular Disease

## 2020-03-13 ENCOUNTER — Other Ambulatory Visit: Payer: Self-pay | Admitting: Adult Health

## 2020-03-18 ENCOUNTER — Other Ambulatory Visit: Payer: Self-pay | Admitting: Adult Health

## 2020-05-14 DIAGNOSIS — Z8249 Family history of ischemic heart disease and other diseases of the circulatory system: Secondary | ICD-10-CM | POA: Diagnosis not present

## 2020-05-14 DIAGNOSIS — Z791 Long term (current) use of non-steroidal anti-inflammatories (NSAID): Secondary | ICD-10-CM | POA: Diagnosis not present

## 2020-05-14 DIAGNOSIS — Z6832 Body mass index (BMI) 32.0-32.9, adult: Secondary | ICD-10-CM | POA: Diagnosis not present

## 2020-05-14 DIAGNOSIS — E669 Obesity, unspecified: Secondary | ICD-10-CM | POA: Diagnosis not present

## 2020-05-14 DIAGNOSIS — E785 Hyperlipidemia, unspecified: Secondary | ICD-10-CM | POA: Diagnosis not present

## 2020-05-14 DIAGNOSIS — I2510 Atherosclerotic heart disease of native coronary artery without angina pectoris: Secondary | ICD-10-CM | POA: Diagnosis not present

## 2020-05-14 DIAGNOSIS — Z96642 Presence of left artificial hip joint: Secondary | ICD-10-CM | POA: Diagnosis not present

## 2020-05-14 DIAGNOSIS — I10 Essential (primary) hypertension: Secondary | ICD-10-CM | POA: Diagnosis not present

## 2020-05-15 DIAGNOSIS — M9901 Segmental and somatic dysfunction of cervical region: Secondary | ICD-10-CM | POA: Diagnosis not present

## 2020-05-15 DIAGNOSIS — M9905 Segmental and somatic dysfunction of pelvic region: Secondary | ICD-10-CM | POA: Diagnosis not present

## 2020-05-15 DIAGNOSIS — M9904 Segmental and somatic dysfunction of sacral region: Secondary | ICD-10-CM | POA: Diagnosis not present

## 2020-05-15 DIAGNOSIS — M9903 Segmental and somatic dysfunction of lumbar region: Secondary | ICD-10-CM | POA: Diagnosis not present

## 2020-05-16 DIAGNOSIS — M9901 Segmental and somatic dysfunction of cervical region: Secondary | ICD-10-CM | POA: Diagnosis not present

## 2020-05-16 DIAGNOSIS — M9905 Segmental and somatic dysfunction of pelvic region: Secondary | ICD-10-CM | POA: Diagnosis not present

## 2020-05-16 DIAGNOSIS — M9903 Segmental and somatic dysfunction of lumbar region: Secondary | ICD-10-CM | POA: Diagnosis not present

## 2020-05-16 DIAGNOSIS — M9904 Segmental and somatic dysfunction of sacral region: Secondary | ICD-10-CM | POA: Diagnosis not present

## 2020-05-17 DIAGNOSIS — M9905 Segmental and somatic dysfunction of pelvic region: Secondary | ICD-10-CM | POA: Diagnosis not present

## 2020-05-17 DIAGNOSIS — M9904 Segmental and somatic dysfunction of sacral region: Secondary | ICD-10-CM | POA: Diagnosis not present

## 2020-05-17 DIAGNOSIS — M9901 Segmental and somatic dysfunction of cervical region: Secondary | ICD-10-CM | POA: Diagnosis not present

## 2020-05-17 DIAGNOSIS — M9903 Segmental and somatic dysfunction of lumbar region: Secondary | ICD-10-CM | POA: Diagnosis not present

## 2020-05-20 DIAGNOSIS — M9901 Segmental and somatic dysfunction of cervical region: Secondary | ICD-10-CM | POA: Diagnosis not present

## 2020-05-20 DIAGNOSIS — M9904 Segmental and somatic dysfunction of sacral region: Secondary | ICD-10-CM | POA: Diagnosis not present

## 2020-05-20 DIAGNOSIS — M9905 Segmental and somatic dysfunction of pelvic region: Secondary | ICD-10-CM | POA: Diagnosis not present

## 2020-05-20 DIAGNOSIS — M9903 Segmental and somatic dysfunction of lumbar region: Secondary | ICD-10-CM | POA: Diagnosis not present

## 2020-05-22 DIAGNOSIS — M9905 Segmental and somatic dysfunction of pelvic region: Secondary | ICD-10-CM | POA: Diagnosis not present

## 2020-05-22 DIAGNOSIS — M9904 Segmental and somatic dysfunction of sacral region: Secondary | ICD-10-CM | POA: Diagnosis not present

## 2020-05-22 DIAGNOSIS — M9901 Segmental and somatic dysfunction of cervical region: Secondary | ICD-10-CM | POA: Diagnosis not present

## 2020-05-22 DIAGNOSIS — M9903 Segmental and somatic dysfunction of lumbar region: Secondary | ICD-10-CM | POA: Diagnosis not present

## 2020-05-24 DIAGNOSIS — M9904 Segmental and somatic dysfunction of sacral region: Secondary | ICD-10-CM | POA: Diagnosis not present

## 2020-05-24 DIAGNOSIS — M9903 Segmental and somatic dysfunction of lumbar region: Secondary | ICD-10-CM | POA: Diagnosis not present

## 2020-05-24 DIAGNOSIS — M9905 Segmental and somatic dysfunction of pelvic region: Secondary | ICD-10-CM | POA: Diagnosis not present

## 2020-05-24 DIAGNOSIS — M9901 Segmental and somatic dysfunction of cervical region: Secondary | ICD-10-CM | POA: Diagnosis not present

## 2020-05-27 DIAGNOSIS — M9905 Segmental and somatic dysfunction of pelvic region: Secondary | ICD-10-CM | POA: Diagnosis not present

## 2020-05-27 DIAGNOSIS — M9901 Segmental and somatic dysfunction of cervical region: Secondary | ICD-10-CM | POA: Diagnosis not present

## 2020-05-27 DIAGNOSIS — M9904 Segmental and somatic dysfunction of sacral region: Secondary | ICD-10-CM | POA: Diagnosis not present

## 2020-05-27 DIAGNOSIS — M9903 Segmental and somatic dysfunction of lumbar region: Secondary | ICD-10-CM | POA: Diagnosis not present

## 2020-05-29 DIAGNOSIS — M9903 Segmental and somatic dysfunction of lumbar region: Secondary | ICD-10-CM | POA: Diagnosis not present

## 2020-05-29 DIAGNOSIS — M9905 Segmental and somatic dysfunction of pelvic region: Secondary | ICD-10-CM | POA: Diagnosis not present

## 2020-05-29 DIAGNOSIS — M9904 Segmental and somatic dysfunction of sacral region: Secondary | ICD-10-CM | POA: Diagnosis not present

## 2020-05-29 DIAGNOSIS — M9901 Segmental and somatic dysfunction of cervical region: Secondary | ICD-10-CM | POA: Diagnosis not present

## 2020-05-31 DIAGNOSIS — M9903 Segmental and somatic dysfunction of lumbar region: Secondary | ICD-10-CM | POA: Diagnosis not present

## 2020-05-31 DIAGNOSIS — M9905 Segmental and somatic dysfunction of pelvic region: Secondary | ICD-10-CM | POA: Diagnosis not present

## 2020-05-31 DIAGNOSIS — M9904 Segmental and somatic dysfunction of sacral region: Secondary | ICD-10-CM | POA: Diagnosis not present

## 2020-05-31 DIAGNOSIS — M9901 Segmental and somatic dysfunction of cervical region: Secondary | ICD-10-CM | POA: Diagnosis not present

## 2020-06-10 DIAGNOSIS — M9901 Segmental and somatic dysfunction of cervical region: Secondary | ICD-10-CM | POA: Diagnosis not present

## 2020-06-10 DIAGNOSIS — M9904 Segmental and somatic dysfunction of sacral region: Secondary | ICD-10-CM | POA: Diagnosis not present

## 2020-06-10 DIAGNOSIS — M9905 Segmental and somatic dysfunction of pelvic region: Secondary | ICD-10-CM | POA: Diagnosis not present

## 2020-06-10 DIAGNOSIS — M9903 Segmental and somatic dysfunction of lumbar region: Secondary | ICD-10-CM | POA: Diagnosis not present

## 2020-06-19 DIAGNOSIS — M9903 Segmental and somatic dysfunction of lumbar region: Secondary | ICD-10-CM | POA: Diagnosis not present

## 2020-06-19 DIAGNOSIS — M9901 Segmental and somatic dysfunction of cervical region: Secondary | ICD-10-CM | POA: Diagnosis not present

## 2020-06-19 DIAGNOSIS — M9904 Segmental and somatic dysfunction of sacral region: Secondary | ICD-10-CM | POA: Diagnosis not present

## 2020-06-19 DIAGNOSIS — M9905 Segmental and somatic dysfunction of pelvic region: Secondary | ICD-10-CM | POA: Diagnosis not present

## 2020-06-21 DIAGNOSIS — M9903 Segmental and somatic dysfunction of lumbar region: Secondary | ICD-10-CM | POA: Diagnosis not present

## 2020-06-21 DIAGNOSIS — M9901 Segmental and somatic dysfunction of cervical region: Secondary | ICD-10-CM | POA: Diagnosis not present

## 2020-06-21 DIAGNOSIS — M9905 Segmental and somatic dysfunction of pelvic region: Secondary | ICD-10-CM | POA: Diagnosis not present

## 2020-06-21 DIAGNOSIS — M9904 Segmental and somatic dysfunction of sacral region: Secondary | ICD-10-CM | POA: Diagnosis not present

## 2020-07-01 DIAGNOSIS — M9905 Segmental and somatic dysfunction of pelvic region: Secondary | ICD-10-CM | POA: Diagnosis not present

## 2020-07-01 DIAGNOSIS — M9904 Segmental and somatic dysfunction of sacral region: Secondary | ICD-10-CM | POA: Diagnosis not present

## 2020-07-01 DIAGNOSIS — M9903 Segmental and somatic dysfunction of lumbar region: Secondary | ICD-10-CM | POA: Diagnosis not present

## 2020-07-01 DIAGNOSIS — M9901 Segmental and somatic dysfunction of cervical region: Secondary | ICD-10-CM | POA: Diagnosis not present

## 2020-07-04 DIAGNOSIS — N401 Enlarged prostate with lower urinary tract symptoms: Secondary | ICD-10-CM | POA: Diagnosis not present

## 2020-07-04 DIAGNOSIS — I2510 Atherosclerotic heart disease of native coronary artery without angina pectoris: Secondary | ICD-10-CM | POA: Diagnosis not present

## 2020-07-04 DIAGNOSIS — R8290 Unspecified abnormal findings in urine: Secondary | ICD-10-CM | POA: Diagnosis not present

## 2020-07-04 DIAGNOSIS — Z0000 Encounter for general adult medical examination without abnormal findings: Secondary | ICD-10-CM | POA: Diagnosis not present

## 2020-07-04 DIAGNOSIS — Z8739 Personal history of other diseases of the musculoskeletal system and connective tissue: Secondary | ICD-10-CM | POA: Diagnosis not present

## 2020-07-04 DIAGNOSIS — E785 Hyperlipidemia, unspecified: Secondary | ICD-10-CM | POA: Diagnosis not present

## 2020-07-04 DIAGNOSIS — N529 Male erectile dysfunction, unspecified: Secondary | ICD-10-CM | POA: Diagnosis not present

## 2020-07-04 DIAGNOSIS — L570 Actinic keratosis: Secondary | ICD-10-CM | POA: Diagnosis not present

## 2020-07-04 DIAGNOSIS — I10 Essential (primary) hypertension: Secondary | ICD-10-CM | POA: Diagnosis not present

## 2020-07-04 DIAGNOSIS — M47896 Other spondylosis, lumbar region: Secondary | ICD-10-CM | POA: Diagnosis not present

## 2020-07-05 DIAGNOSIS — M9905 Segmental and somatic dysfunction of pelvic region: Secondary | ICD-10-CM | POA: Diagnosis not present

## 2020-07-05 DIAGNOSIS — M9904 Segmental and somatic dysfunction of sacral region: Secondary | ICD-10-CM | POA: Diagnosis not present

## 2020-07-05 DIAGNOSIS — M9901 Segmental and somatic dysfunction of cervical region: Secondary | ICD-10-CM | POA: Diagnosis not present

## 2020-07-05 DIAGNOSIS — M9903 Segmental and somatic dysfunction of lumbar region: Secondary | ICD-10-CM | POA: Diagnosis not present

## 2020-07-08 DIAGNOSIS — M9901 Segmental and somatic dysfunction of cervical region: Secondary | ICD-10-CM | POA: Diagnosis not present

## 2020-07-08 DIAGNOSIS — M9905 Segmental and somatic dysfunction of pelvic region: Secondary | ICD-10-CM | POA: Diagnosis not present

## 2020-07-08 DIAGNOSIS — M9903 Segmental and somatic dysfunction of lumbar region: Secondary | ICD-10-CM | POA: Diagnosis not present

## 2020-07-08 DIAGNOSIS — M9904 Segmental and somatic dysfunction of sacral region: Secondary | ICD-10-CM | POA: Diagnosis not present

## 2020-07-09 DIAGNOSIS — R030 Elevated blood-pressure reading, without diagnosis of hypertension: Secondary | ICD-10-CM | POA: Diagnosis not present

## 2020-07-09 DIAGNOSIS — M4316 Spondylolisthesis, lumbar region: Secondary | ICD-10-CM | POA: Diagnosis not present

## 2020-07-09 DIAGNOSIS — Z6831 Body mass index (BMI) 31.0-31.9, adult: Secondary | ICD-10-CM | POA: Diagnosis not present

## 2020-07-10 ENCOUNTER — Other Ambulatory Visit: Payer: Self-pay | Admitting: Neurosurgery

## 2020-07-10 DIAGNOSIS — M4316 Spondylolisthesis, lumbar region: Secondary | ICD-10-CM

## 2020-07-11 DIAGNOSIS — M9903 Segmental and somatic dysfunction of lumbar region: Secondary | ICD-10-CM | POA: Diagnosis not present

## 2020-07-11 DIAGNOSIS — M9905 Segmental and somatic dysfunction of pelvic region: Secondary | ICD-10-CM | POA: Diagnosis not present

## 2020-07-11 DIAGNOSIS — M9904 Segmental and somatic dysfunction of sacral region: Secondary | ICD-10-CM | POA: Diagnosis not present

## 2020-07-11 DIAGNOSIS — M9901 Segmental and somatic dysfunction of cervical region: Secondary | ICD-10-CM | POA: Diagnosis not present

## 2020-07-16 DIAGNOSIS — M9904 Segmental and somatic dysfunction of sacral region: Secondary | ICD-10-CM | POA: Diagnosis not present

## 2020-07-16 DIAGNOSIS — M9903 Segmental and somatic dysfunction of lumbar region: Secondary | ICD-10-CM | POA: Diagnosis not present

## 2020-07-16 DIAGNOSIS — M9901 Segmental and somatic dysfunction of cervical region: Secondary | ICD-10-CM | POA: Diagnosis not present

## 2020-07-16 DIAGNOSIS — M9905 Segmental and somatic dysfunction of pelvic region: Secondary | ICD-10-CM | POA: Diagnosis not present

## 2020-07-18 DIAGNOSIS — M9904 Segmental and somatic dysfunction of sacral region: Secondary | ICD-10-CM | POA: Diagnosis not present

## 2020-07-18 DIAGNOSIS — M9905 Segmental and somatic dysfunction of pelvic region: Secondary | ICD-10-CM | POA: Diagnosis not present

## 2020-07-18 DIAGNOSIS — M9903 Segmental and somatic dysfunction of lumbar region: Secondary | ICD-10-CM | POA: Diagnosis not present

## 2020-07-18 DIAGNOSIS — M9901 Segmental and somatic dysfunction of cervical region: Secondary | ICD-10-CM | POA: Diagnosis not present

## 2020-07-22 DIAGNOSIS — Z8739 Personal history of other diseases of the musculoskeletal system and connective tissue: Secondary | ICD-10-CM | POA: Diagnosis not present

## 2020-07-22 DIAGNOSIS — R8290 Unspecified abnormal findings in urine: Secondary | ICD-10-CM | POA: Diagnosis not present

## 2020-07-22 DIAGNOSIS — I10 Essential (primary) hypertension: Secondary | ICD-10-CM | POA: Diagnosis not present

## 2020-07-22 DIAGNOSIS — N401 Enlarged prostate with lower urinary tract symptoms: Secondary | ICD-10-CM | POA: Diagnosis not present

## 2020-07-22 DIAGNOSIS — L570 Actinic keratosis: Secondary | ICD-10-CM | POA: Diagnosis not present

## 2020-07-22 DIAGNOSIS — R69 Illness, unspecified: Secondary | ICD-10-CM | POA: Diagnosis not present

## 2020-07-22 DIAGNOSIS — E785 Hyperlipidemia, unspecified: Secondary | ICD-10-CM | POA: Diagnosis not present

## 2020-07-22 DIAGNOSIS — I2510 Atherosclerotic heart disease of native coronary artery without angina pectoris: Secondary | ICD-10-CM | POA: Diagnosis not present

## 2020-07-22 DIAGNOSIS — N529 Male erectile dysfunction, unspecified: Secondary | ICD-10-CM | POA: Diagnosis not present

## 2020-07-22 DIAGNOSIS — M47896 Other spondylosis, lumbar region: Secondary | ICD-10-CM | POA: Diagnosis not present

## 2020-07-22 DIAGNOSIS — Z0000 Encounter for general adult medical examination without abnormal findings: Secondary | ICD-10-CM | POA: Diagnosis not present

## 2020-07-23 DIAGNOSIS — M9901 Segmental and somatic dysfunction of cervical region: Secondary | ICD-10-CM | POA: Diagnosis not present

## 2020-07-23 DIAGNOSIS — M9904 Segmental and somatic dysfunction of sacral region: Secondary | ICD-10-CM | POA: Diagnosis not present

## 2020-07-23 DIAGNOSIS — M9903 Segmental and somatic dysfunction of lumbar region: Secondary | ICD-10-CM | POA: Diagnosis not present

## 2020-07-23 DIAGNOSIS — M9905 Segmental and somatic dysfunction of pelvic region: Secondary | ICD-10-CM | POA: Diagnosis not present

## 2020-07-25 DIAGNOSIS — M9903 Segmental and somatic dysfunction of lumbar region: Secondary | ICD-10-CM | POA: Diagnosis not present

## 2020-07-25 DIAGNOSIS — M9904 Segmental and somatic dysfunction of sacral region: Secondary | ICD-10-CM | POA: Diagnosis not present

## 2020-07-25 DIAGNOSIS — M9905 Segmental and somatic dysfunction of pelvic region: Secondary | ICD-10-CM | POA: Diagnosis not present

## 2020-07-25 DIAGNOSIS — M9901 Segmental and somatic dysfunction of cervical region: Secondary | ICD-10-CM | POA: Diagnosis not present

## 2020-07-30 DIAGNOSIS — M9904 Segmental and somatic dysfunction of sacral region: Secondary | ICD-10-CM | POA: Diagnosis not present

## 2020-07-30 DIAGNOSIS — M9905 Segmental and somatic dysfunction of pelvic region: Secondary | ICD-10-CM | POA: Diagnosis not present

## 2020-07-30 DIAGNOSIS — M9903 Segmental and somatic dysfunction of lumbar region: Secondary | ICD-10-CM | POA: Diagnosis not present

## 2020-07-30 DIAGNOSIS — M9901 Segmental and somatic dysfunction of cervical region: Secondary | ICD-10-CM | POA: Diagnosis not present

## 2020-08-01 DIAGNOSIS — M9905 Segmental and somatic dysfunction of pelvic region: Secondary | ICD-10-CM | POA: Diagnosis not present

## 2020-08-01 DIAGNOSIS — M9901 Segmental and somatic dysfunction of cervical region: Secondary | ICD-10-CM | POA: Diagnosis not present

## 2020-08-01 DIAGNOSIS — M9903 Segmental and somatic dysfunction of lumbar region: Secondary | ICD-10-CM | POA: Diagnosis not present

## 2020-08-01 DIAGNOSIS — M9904 Segmental and somatic dysfunction of sacral region: Secondary | ICD-10-CM | POA: Diagnosis not present

## 2020-08-06 DIAGNOSIS — M9904 Segmental and somatic dysfunction of sacral region: Secondary | ICD-10-CM | POA: Diagnosis not present

## 2020-08-06 DIAGNOSIS — M9905 Segmental and somatic dysfunction of pelvic region: Secondary | ICD-10-CM | POA: Diagnosis not present

## 2020-08-06 DIAGNOSIS — M9903 Segmental and somatic dysfunction of lumbar region: Secondary | ICD-10-CM | POA: Diagnosis not present

## 2020-08-06 DIAGNOSIS — M9901 Segmental and somatic dysfunction of cervical region: Secondary | ICD-10-CM | POA: Diagnosis not present

## 2020-08-07 ENCOUNTER — Ambulatory Visit
Admission: RE | Admit: 2020-08-07 | Discharge: 2020-08-07 | Disposition: A | Discharge status: Home / Self Care | Payer: Medicare HMO | Source: Ambulatory Visit | Attending: Neurosurgery | Admitting: Neurosurgery

## 2020-08-07 ENCOUNTER — Other Ambulatory Visit: Payer: Self-pay

## 2020-08-07 DIAGNOSIS — M4316 Spondylolisthesis, lumbar region: Secondary | ICD-10-CM

## 2020-08-07 DIAGNOSIS — M48061 Spinal stenosis, lumbar region without neurogenic claudication: Secondary | ICD-10-CM | POA: Diagnosis not present

## 2020-08-08 DIAGNOSIS — R808 Other proteinuria: Secondary | ICD-10-CM | POA: Diagnosis not present

## 2020-08-12 DIAGNOSIS — M48061 Spinal stenosis, lumbar region without neurogenic claudication: Secondary | ICD-10-CM | POA: Diagnosis not present

## 2020-08-12 DIAGNOSIS — I10 Essential (primary) hypertension: Secondary | ICD-10-CM | POA: Diagnosis not present

## 2020-08-12 DIAGNOSIS — Z6833 Body mass index (BMI) 33.0-33.9, adult: Secondary | ICD-10-CM | POA: Diagnosis not present

## 2020-08-15 DIAGNOSIS — M9905 Segmental and somatic dysfunction of pelvic region: Secondary | ICD-10-CM | POA: Diagnosis not present

## 2020-08-15 DIAGNOSIS — M9901 Segmental and somatic dysfunction of cervical region: Secondary | ICD-10-CM | POA: Diagnosis not present

## 2020-08-15 DIAGNOSIS — M9903 Segmental and somatic dysfunction of lumbar region: Secondary | ICD-10-CM | POA: Diagnosis not present

## 2020-08-15 DIAGNOSIS — M545 Low back pain, unspecified: Secondary | ICD-10-CM | POA: Diagnosis not present

## 2020-08-20 DIAGNOSIS — M9903 Segmental and somatic dysfunction of lumbar region: Secondary | ICD-10-CM | POA: Diagnosis not present

## 2020-08-20 DIAGNOSIS — M545 Low back pain, unspecified: Secondary | ICD-10-CM | POA: Diagnosis not present

## 2020-08-20 DIAGNOSIS — M9905 Segmental and somatic dysfunction of pelvic region: Secondary | ICD-10-CM | POA: Diagnosis not present

## 2020-08-20 DIAGNOSIS — M9901 Segmental and somatic dysfunction of cervical region: Secondary | ICD-10-CM | POA: Diagnosis not present

## 2020-08-22 DIAGNOSIS — M9901 Segmental and somatic dysfunction of cervical region: Secondary | ICD-10-CM | POA: Diagnosis not present

## 2020-08-22 DIAGNOSIS — M545 Low back pain, unspecified: Secondary | ICD-10-CM | POA: Diagnosis not present

## 2020-08-22 DIAGNOSIS — M9905 Segmental and somatic dysfunction of pelvic region: Secondary | ICD-10-CM | POA: Diagnosis not present

## 2020-08-22 DIAGNOSIS — M9903 Segmental and somatic dysfunction of lumbar region: Secondary | ICD-10-CM | POA: Diagnosis not present

## 2020-09-03 DIAGNOSIS — M9901 Segmental and somatic dysfunction of cervical region: Secondary | ICD-10-CM | POA: Diagnosis not present

## 2020-09-03 DIAGNOSIS — M9903 Segmental and somatic dysfunction of lumbar region: Secondary | ICD-10-CM | POA: Diagnosis not present

## 2020-09-03 DIAGNOSIS — M545 Low back pain, unspecified: Secondary | ICD-10-CM | POA: Diagnosis not present

## 2020-09-03 DIAGNOSIS — M9905 Segmental and somatic dysfunction of pelvic region: Secondary | ICD-10-CM | POA: Diagnosis not present

## 2020-09-12 ENCOUNTER — Ambulatory Visit: Payer: Medicare HMO | Admitting: Cardiovascular Disease

## 2020-09-12 ENCOUNTER — Other Ambulatory Visit: Payer: Self-pay

## 2020-09-12 ENCOUNTER — Encounter: Payer: Self-pay | Admitting: Cardiovascular Disease

## 2020-09-12 VITALS — BP 142/88 | HR 61 | Ht 68.0 in | Wt 222.6 lb

## 2020-09-12 DIAGNOSIS — R600 Localized edema: Secondary | ICD-10-CM

## 2020-09-12 DIAGNOSIS — I10 Essential (primary) hypertension: Secondary | ICD-10-CM

## 2020-09-12 DIAGNOSIS — R69 Illness, unspecified: Secondary | ICD-10-CM | POA: Diagnosis not present

## 2020-09-12 DIAGNOSIS — G8929 Other chronic pain: Secondary | ICD-10-CM | POA: Diagnosis not present

## 2020-09-12 DIAGNOSIS — Z951 Presence of aortocoronary bypass graft: Secondary | ICD-10-CM

## 2020-09-12 DIAGNOSIS — E7849 Other hyperlipidemia: Secondary | ICD-10-CM

## 2020-09-12 DIAGNOSIS — M5416 Radiculopathy, lumbar region: Secondary | ICD-10-CM | POA: Diagnosis not present

## 2020-09-12 DIAGNOSIS — M48062 Spinal stenosis, lumbar region with neurogenic claudication: Secondary | ICD-10-CM | POA: Diagnosis not present

## 2020-09-12 DIAGNOSIS — I25110 Atherosclerotic heart disease of native coronary artery with unstable angina pectoris: Secondary | ICD-10-CM | POA: Diagnosis not present

## 2020-09-12 DIAGNOSIS — M545 Low back pain, unspecified: Secondary | ICD-10-CM

## 2020-09-12 MED ORDER — HYDROCHLOROTHIAZIDE 12.5 MG PO CAPS: 12.5000 mg | ORAL_CAPSULE | ORAL | 3 refills | Status: DC

## 2020-09-12 MED ORDER — FUROSEMIDE 20 MG PO TABS
20.0000 mg | ORAL_TABLET | ORAL | 3 refills | Status: DC | PRN
Start: 2020-09-12 — End: 2020-09-12

## 2020-09-12 MED ORDER — FUROSEMIDE 20 MG PO TABS
20.0000 mg | ORAL_TABLET | ORAL | 3 refills | Status: DC | PRN
Start: 2020-09-12 — End: 2020-10-02

## 2020-09-12 NOTE — Patient Instructions (Signed)
Medication Instructions:  TAKE LASIX AS NEEDED  START HYDROCHLOROTHIAZIDE EVERY OTHER DAY TO EVERY 3RD DAY DEPENDING ON SWELLING AND BLOOD PRESSURE  *If you need a refill on your cardiac medications before your next appointment, please call your pharmacy*  Lab Work: CMP, CBC, TSH, LIPIDS- these need to be done after fasting, please return for these  If you have labs (blood work) drawn today and your tests are completely normal, you will receive your results only by: Marland Kitchen MyChart Message (if you have MyChart) OR . A paper copy in the mail If you have any lab test that is abnormal or we need to change your treatment, we will call you to review the results.  Testing/Procedures: Your physician has requested that you have an echocardiogram. Echocardiography is a painless test that uses sound waves to create images of your heart. It provides your doctor with information about the size and shape of your heart and how well your heart's chambers and valves are working. You may receive an ultrasound enhancing agent through an IV if needed to better visualize your heart during the echo.This procedure takes approximately one hour. There are no restrictions for this procedure. This will take place at the [address], [address].   Follow-Up: At [facility], you and your health needs are our priority.  As part of our continuing mission to provide you with exceptional heart care, we have created designated Provider Care Teams.  These Care Teams include your primary Cardiologist (physician) and Advanced Practice Providers (APPs -  Physician Assistants and Nurse Practitioners) who all work together to provide you with the care you need, when you need it.  Your next appointment:   6 month(s)  The format for your next appointment:   In Person  Provider:   Shelva Majestic, MD

## 2020-09-12 NOTE — Progress Notes (Signed)
Patient ID: Robb Sibal, male   DOB: Jul 06, 1946, 74 y.o.   MRN: [id number]     HPI:  Delan Ksiazek is a 74 y.o. male who is a former patient of Dr. Rollene Fare.  He presents for 9 month follow-up evaluation.  Mr. Laymon has a long-standing history of familial hyperlipidemia.  His father and one of his brothers had familial hyperlipidemia.   His father died at age 38 secondary to advanced coronary artery disease.  Mr. Dancy cholesterols in the past had been as high as 400.  He was initially diagnosed in the 1970s.  He has participated in numerous drug studies over the years and was one of the precipitants in the initial statin trials with Mevacor.  As part of one of his studies, he underwent diagnostic cardiac catheterization in [place].  He was asymptomatic with chest pain but was found to have mild coronary obstructive disease with 20-40% RCA stenoses, 20-30% circumflex stenoses,  At least moderate LAD stenosis with poststenotic dilatation involving the diagonal and septal perforating artery in 1987.  He has not been on medical therapy for CAD, with the exception of aggressive lipid-lowering treatment.  His last nuclear perfusion study was in 2012, which continue to show normal perfusion.  There was attenuation artifact.  Post-rest ejection fraction was 53%. In June 2014 an echo Doppler study  showed an ejection fraction of 55-60%; Normal diastolic parameters.  He had mild left atrial dilatation.  There was mild aortic valve sclerosis without stenosis.  The patient tells me he has been aggressive with his follow-up.  He typically has laboratory checked via life extension on his own. Remotely, he also underwent VAP testing to determine particles size and numbers.  H e has been on Zetia 10 mg Crestor 40 mg, Niaspan 2000 mg and 2000 mg of omega-3 fatty acids.  I did review blood work that he had had in April 2015 from life extension and at that time on aggressive medical therapy.  His total cholesterol  was 185, triglycerides 59, HDL cholesterol 82, LDL cholesterol 91.  Homocysteine was 11.7.  Hemoglobin A1c was 5.7.  Thyroid function studies were normal.  He was not anemic.  He had normal renal function.  He participated in this study looking at possible side effects from the injection site for PCSK9 inhibitor therapy.  He tells me his brother  underwent stenting of his coronary artery age 75.  The patient also has a history of gout, and hypertension.  He has been tolerating my Micardis 80 mg without side effects.  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  He denies chest pain.  He denies PND, orthopnea.  He denies palpitations.  In February 2016 he had an NMR LipoProfile.  Total cholesterol was 185, triglycerides 72, HDL 94, LDL C 77, and LDL particle number was excellent at 683.  Insulin resistance score was 31.  In March 2016  a nuclear perfusion study continued  to show normal perfusion and was low risk with probable bowel attenuation artifact.  As part of his own blood testing he had another lipid panel in August 2016 which showed a total cholesterol 140, HDL 50, LDL 77, and very normal triglyceride levels.  He had purposeful weight loss of 30 pounds.  He tells me he had undergone hip surgery by Dr. Lonzo Cloud in [place].  He underwent an NMR LipoProfile on 12/20/2015.  Total cholesterol is 153, HDL 76, triglycerides 45, and calculated LDL was 68.  He had  1241, LDL particles which were predominantly large.  There were only 196 small LDL particles.  April lipoprotein B was normal at 61 as was LPa at 14.  He again had his labs rechecked as part of life extension, blood testing in June 2017.  He has a remote history of gout and has been taking a you.  All and uric acid was 3.3.  Renal function was normal.  He had normal LFTs.  Lipid studies revealed a total cholesterol of 167, triglycerides 46, HDL 85, and LDL 73.  Homocysteine was normal at 9.5.  PSA was 0.8.  Testosterone was low at  297.  Hemoglobin A1c was excellent at 5.3.  TSH was normal.  He was not anemic with hemoglobin of 13.5, hematocrit of 42.3.  MCV was minimally increased at 102.  Vitamin D level was normal at 95.7.  He had been taking vitamin D 10,000 units daily supplementation  Since I last saw him one year ago, he has remained active.  Over the past year he remains active.  He plays golf at least 2 days per week.  days a week.  He walks several other days.  He continues to work as a Engineer, petroleum.  He is widowed and not sexually active.   He underwent follow-up laboratory on 04/14/2017 at life extension.  BUN 16, creatinine 0.96.  Total cholesterol 189, triglycerides 71, HDL 89, LDL 86.  Homocysteine 14.5.  C-reactive protein 2.1.  Hemoglobin 14.1/hematocrit 42.8.  Serum testosterone 196.  TSH 2.6.  Vitamin D 50.2.  Apo lipoprotein B 81.    When I  saw him in December 2018, I had a long discussion with reference to PCSK 9 inhibition in light of his familial hyperlipidemia.  He has since been started on Praluent 150 mg every 2-week injections and had received 2 months of treatment.  Follow-up laboratory 2 days ago has shown a total cholesterol of 139, triglycerides 66, HDL 87, and LDL 39.  He has not had any side  effects to the medication.  His insurance company had denied Repatha.  He continues to be on concomitant therapy with Zetia 10 mg and rosuvastatin 40 mg.  He also is on Toprol-XL 25 mg and telmisartan 80 mg for hypertension.  He continues to work in a Scientist, physiological.  He recently returned from a two-week trip to [place] to visit his son who lives there.    I saw him in April 2019.  He continued to be asymptomatic with reference to chest pain, and did not have any change in exercise tolerance.  Lipid studies at that time revealed a total cholesterol 139, triglycerides 66, HDL 87 and LDL cholesterol was 39.  On his own, he underwent laboratory by life extension in August 2019.  Chemistry  was normal.  Fasting glucose was 83.  Total cholesterol 114, triglycerides 42, HDL 81, and LDL was 25.  Homocystine was normal.  Vitamin D level was 63.2.  Insulin level was normal at 5.7.  Apo lipoprotein B was excellent at 32.  Life line screening screening was done by his own accord which he typically gets done every several years.  This now has shown improvement from previous evaluations with carotids now being totally normal without previous plaque.  His abdominal aorta was normal, there was no PVD.  Remains active.  He plays golf 2 times per week at a minimum.  He admits to weight gain over the holidays.   When I  saw  him in the office in January 2020, he was remaining fairly stable.  However, over the past 6 months he has noticed some mild gradual increase in exertional shortness of breath.  He saw his primary physician, Dr. Hulan Fess and due to his exertional component of dyspnea he referred him back to our office and he saw Dr. Grayling Congress on July 10, 2019 for further evaluation.  An echo Doppler study on July 11, 2019 revealed EF at 60 to 65% with grade 1 diastolic dysfunction.  He was referred for a nuclear perfusion study which was done with a Lexiscan protocol and this showed clear change from his prior evaluations and was interpreted as high risk with transient ischemic dilatation, EF 39%, and ischemia in several distributions. There was significant discussion with Dr. Davina Poke and an attempt at scheduling him for coronary CTA was unsuccessful.  After much discussion the decision was made to proceed with cardiac catheterization which initially the patient had some reservation.  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  Apparently his angiographic data was reviewed at [facility] and decision was not to proceed with intervention or bypass at that time and he has been  on medical therapy for over 30 years.    I saw him for telemedicine evaluation on August 30, 2019 prior to undergoing his cardiac catheterization which was scheduled for the following day.  At that time, he told me that since he was  notified of his abnormal results on his own he increased aspirin from 81 mg up to 325 mg and he had old prescription of Plavix which had been prescribed by Dr. Rollene Fare approximately 8 years ago.  For the past several weeks he put himself back on his oral Plavix medication and he stated that he had checked on the Internet and felt that even though it may not be as potent as it may have been when initially prescribed to him he felt that this provided some additional coverage until his catheterization.  Cardiac catheterization was performed by me on August 31 2019.  This revealed severe native CAD with 80% calcified distal left main stenosis, 99% ostial LAD stenosis with total occlusion of the LAD after the proximal diagonal and septal perforating artery, 80% ostial circumflex stenosis followed by 80% proximal stenosis with extensive collateralization to a dominant RCA from the circumflex as well as faint collaterals to the distal LAD and total proximal RCA occlusion.  He underwent urgent surgical consultation and on September 01, 2019 he underwent successful CABG revascularization with a LIMA to the LAD, vein to the diagonal 1 and OM 2 and PLV with endoscopic vein harvesting from the right lower extremity.  He received IV Lasix for fluid overload.  He was discharged on September 06, 2019.  During his hospitalization, hydrochlorothiazide, Plavix, and telmisartan were discontinued prior to discharge due to bradycardia and hypotension.  He was evaluated by Bunnie Domino on September 18, 2019 and had URI symptoms and fever.  A chest x-ray did not reveal pneumonia and he tested negative for Covid, flu, and srep.  He has subsequently been evaluated by a physician and his primary care's  office and has undergone several courses of antibiotics including Augmentin and doxycycline, but most recently levofloxacin.  His fever has resolved and he is feeling better but he still admits to occasional cough.   I saw him for his initial  post hospital evaluation with me on September 29, 2019.  He has not had any pulmonary evaluation.  From a cardiac perspective, he admits to residual swelling of his right lower extremity.  He had been on Lasix 40 mg daily but this was discontinued approximately 3 weeks ago after he had seen Dr. Kipp Brood who performed his CABG revascularization surgery.  He continues to be on Praluent 150 mg every 2 weeks, rosuvastatin 40 mg and Zetia 10 mg for his hereditary hyperlipidemia.  He is unaware of palpitations.  He denies presyncope or syncope.  During that evaluation, he had tense lower extremity edema.  I recommended reinstitution of furosemide initially at 20 mg for the next 5 to 7 days then potentially as needed.  I change his metoprolol to succinate 25 mg daily.  He was on Praluent 150 mg, rosuvastatin and Zetia for familial hyperlipidemia.  Since I last saw him, he is remained stable over the past 3 months.  He had significant benefit from initial furosemide and now has been taking 20 mg 2 times per week.  However he still experiences mild right lower edema.  Since his CABG revascularization, he notes improvement in previous exertional dyspnea.  He had never experienced significant chest pressure.  He has been participating in hard strides cardiac rehab at [facility] which will be for 3 months duration.  He underwent repeat laboratory on January 01, 2020.  Total cholesterol is now 115, triglycerides 82, HDL 80, and LDL cholesterol 19 on his PCSK9, high potency statin and Zetia therapy.  He denies any palpitations.  He denies presyncope or syncope.    Past Medical History:  Diagnosis Date  . BCC (basal cell carcinoma) 10/07/2009   back of right ear-([facility])  . BCC  (basal cell carcinoma) x 2 06/01/2017   left post shoulder-sup (CX35FU), left post leg   . Coronary artery disease   . Gout    Of the left third, fourth, and fifth MTPareas with swelling of left foot and pain  . Hyperlipidemia   . Hypertension     Past Surgical History:  Procedure Laterality Date  . CARDIAC CATHETERIZATION  01/04/1986  . CORONARY ARTERY BYPASS GRAFT N/A 09/01/2019   Procedure: CORONARY ARTERY BYPASS GRAFTING (CABG) x Four , using left internal mammary artery and right leg greater saphenous vein harvested endoscopically;  Surgeon: Lajuana Matte, MD;  Location: [facility];  Service: Open Heart Surgery;  Laterality: N/A;  . LEFT HEART CATH AND CORONARY ANGIOGRAPHY N/A 08/31/2019   Procedure: LEFT HEART CATH AND CORONARY ANGIOGRAPHY;  Surgeon: Troy Sine, MD;  Location: [facility];  Service: Cardiovascular;  Laterality: N/A;  . TEE WITHOUT CARDIOVERSION N/A 09/01/2019   Procedure: Transesophageal Echocardiogram (Tee);  Surgeon: Lajuana Matte, MD;  Location: [facility];  Service: Open Heart Surgery;  Laterality: N/A;    No Known Allergies  Current Outpatient Medications  Medication Sig Dispense Refill  . aspirin EC 81 MG tablet Take 81 mg by mouth daily.    . celecoxib (CELEBREX) 100 MG capsule Take 100 mg by mouth 2 (two) times daily.    Marland Kitchen ezetimibe (ZETIA) 10 MG tablet TAKE 1 TABLET BY MOUTH  DAILY (Patient taking differently: Take 10 mg by mouth every evening. ) 90 tablet 3  . furosemide (LASIX) 20 MG tablet Take 1 tablet (20 mg total) by mouth as needed. 30 tablet 3  . ipratropium (ATROVENT) 0.03 % nasal spray Place into both nostrils 2 (two) times daily.     Marland Kitchen  loratadine (CLARITIN) 10 MG tablet TAKE 1 TABLET BY MOUTH EVERY DAY 30 tablet 3  . metoprolol succinate (TOPROL-XL) 25 MG 24 hr tablet Take 1 tablet (25 mg total) by mouth daily. 30 tablet 12  . Oxymetazoline HCl (NASAL SPRAY) 0.05 % SOLN Place 1 spray into the nose daily as needed (congestion).     . PRALUENT 150 MG/ML SOAJ INJECT 150MG INTO THE SKIN EVERY 14 DAYS 2 pen 11  . pregabalin (LYRICA) 75 MG capsule Take 75 mg by mouth 2 (two) times daily.    . rosuvastatin (CRESTOR) 40 MG tablet TAKE 1 TABLET BY MOUTH  DAILY 90 tablet 1  . telmisartan (MICARDIS) 80 MG tablet Take 80 mg by mouth daily.    . hydrochlorothiazide (MICROZIDE) 12.5 MG capsule Take 1 capsule (12.5 mg total) by mouth every other day. Every other day to every 3rd day depending on swelling and blood pressure. 30 capsule 3   No current facility-administered medications for this visit.    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  He still goes to several board meetingS .  t Workhroughout the year he is married and has 2 children, ages 59 and 20.  He has 2 grandchildren.  There is no history of tobacco use.  He does not drink alcohol.  Family history is notable that his mother died at age 9 with old age.  Father died at age 7 with advanced atherosclerosis.  He has one brother who is status post coronary stenting.  ROS General: Negative; No fevers, chills, or night sweats HEENT: Negative; No changes in vision or hearing, sinus congestion, difficulty swallowing Pulmonary: Negative; No cough, wheezing, shortness of breath, hemoptysis Cardiovascular:  See HPI; No chest pain, presyncope, syncope, palpitations,  Hives he may note some trace ankle swelling GI: Negative; No nausea, vomiting, diarrhea, or abdominal pain GU: Negative; No dysuria, hematuria, or difficulty voiding Musculoskeletal: Negative; no myalgias, joint pain, or weakness Hematologic/Oncologic: Negative; no easy bruising, bleeding Neurologic: Positive for gout Endocrine: Negative; no heat/cold intolerance; no diabetes Neuro: Negative; no changes in balance, headaches Skin: Osteophytic for chronic rash  Psychiatric: Negative; No behavioral problems, depression Sleep: Negative; No daytime  sleepiness, hypersomnolence, bruxism, restless legs, hypnogagnic hallucinations Other comprehensive 14 point system review is negative   Physical Exam BP (!) 142/88   Pulse 61   Ht _0  (1.727 m)   Wt 222 lb 9.6 oz (101 kg)   SpO2 95%   BMI 33.85 kg/m    Repeat blood pressure by me was 130/82  Wt Readings from Last 3 Encounters:  09/12/20 222 lb 9.6 oz (101 kg)  01/02/20 220 lb 12.8 oz (100.2 kg)  10/26/19 215 lb 6.2 oz (97.7 kg)   General: Alert, oriented, no distress.  Skin: normal turgor, no rashes, warm and dry HEENT: Normocephalic, atraumatic. Pupils equal round and reactive to light; sclera anicteric; extraocular muscles intact;  Nose without nasal septal hypertrophy Mouth/Parynx benign; Mallinpatti scale Neck: No JVD, no carotid bruits; normal carotid upstroke Lungs: clear to ausculatation and percussion; no wheezing or rales Chest wall: without tenderness to palpitation Heart: PMI not displaced, RRR, s1 s2 normal, 1/6 systolic murmur, no diastolic murmur, no rubs, gallops, thrills, or heaves Abdomen: soft, nontender; no hepatosplenomehaly, BS+; abdominal aorta nontender and not dilated by palpation. Back: no CVA tenderness Pulses 2+ Musculoskeletal: full range of motion, normal strength, no joint deformities Extremities: no clubbing cyanosis or edema, Homan's sign negative  Neurologic: grossly nonfocal; Cranial nerves grossly wnl Psychologic: Normal mood and affect   ECG (independently read by me): NSR at 61, 1st degree block, PR 234 msec.  January 02, 2020 ECG (independently read by me): Normal sinus rhythm at 81 bpm.  No ectopy.  Normal intervals.  Mild RV conduction delay   September 29, 2019 ECG (independently read by me): Normal sinus rhythm at 70 bpm.  Borderline first-degree AV block with a PR level 206 ms.  Nonspecific T wave abnormality in V1 and V2  January 2020 ECG (independently read by me): Sinus rhythm at 81 bpm.  Mild first-degree AV block with a PR  of 206 ms.  Nonspecific T changes.  02/11/2018 ECG (independently read by me): Sinus rhythm at 60 bpm.  First-degree AV block.  No ectopy.  December 2018 ECG (independently read by me): Normal sinus rhythm at 72 bpm.  Early transition.  First-degree AV block with a PR interval of 206 ms.  October 2016 ECG (independently read by me): Normal sinus rhythm at 62.  No ectopy.  No ST segment changes.  ECG (independently read by me): Normal sinus rhythm at 71 bpm.  No ectopy.  QTc interval 441 ms  LABS: BMP Latest Ref Rng & Units 01/01/2020 09/18/2019 09/05/2019  Glucose 65 - 99 mg/dL 73 98 101(H)  BUN 8 - 27 mg/dL _0 Creatinine 0.76 - 1.27 mg/dL 1.02 0.97 0.89  BUN/Creat Ratio 10 - 24 11 8(L) -  Sodium 134 - 144 mmol/L 141 142 138  Potassium 3.5 - 5.2 mmol/L 4.8 4.1 3.8  Chloride 96 - 106 mmol/L 102 101 104  CO2 20 - 29 mmol/L _1 Calcium 8.6 - 10.2 mg/dL 9.4 9.3 8.1(L)   Hepatic Function Latest Ref Rng & Units 01/01/2020 08/31/2019 04/14/2019  Total Protein 6.0 - 8.5 g/dL 6.7 5.9(L) 6.1  Albumin 3.7 - 4.7 g/dL 4.4 3.8 4.4  AST 0 - 40 IU/L _2 ALT 0 - 44 IU/L _3 Alk Phosphatase 39 - 117 IU/L 56 35(L) 40  Total Bilirubin 0.0 - 1.2 mg/dL 0.3 0.7 0.4  Bilirubin, Direct 0.00 - 0.40 mg/dL - - 0.13   CBC Latest Ref Rng & Units 01/01/2020 09/18/2019 09/07/2019  WBC 3.4 - 10.8 x10E3/uL 5.5 6.0 -  Hemoglobin 13.0 - 17.7 g/dL 13.4 9.4(L) 8.3(L)  Hematocrit 37.5 - 51.0 % 41.7 29.1(L) 24.9(L)  Platelets 150 - 450 x10E3/uL 162 345 -   Lab Results  Component Value Date   MCV 96 01/01/2020   MCV 96 09/18/2019   MCV 99.5 09/05/2019   Lab Results  Component Value Date   TSH 2.630 01/01/2020   Lipid Panel     Component Value Date/Time   CHOL 115 01/01/2020 1029   CHOL 153 12/20/2015 0949   TRIG 82 01/01/2020 1029   TRIG 45 12/20/2015 0949   TRIG 72 12/06/2014 0944   HDL 80 01/01/2020 1029   HDL 76 12/20/2015 0949   HDL 94 12/06/2014 0944   CHOLHDL 1.4 01/01/2020 1029     CHOLHDL 2.0 12/20/2015 0949   LDLCALC 19 01/01/2020 1029   LDLCALC 68 12/20/2015 0949   RADIOLOGY: No results found.  CARDIAC STUDIES:  Ost RCA to Prox RCA lesion is 100% stenosed.  Dist LM to Prox LAD lesion is 99% stenosed.  Ost Cx lesion is 80% stenosed.  Prox LAD to Mid LAD lesion is 100% stenosed.  Mid LM lesion is 80% stenosed.  Prox Cx lesion is 80% stenosed.   Severe  native coronary artery disease with 80% calcified distal left main stenosis; 99% ostial LAD stenosis with total occlusion of the LAD after the proximal diagonal and septal perforating artery; 80% ostial circumflex stenosis followed by 80% proximal stenosis with extensive collateralization to a dominant RCA from the circumflex vessel as well as faint collaterals to the distal LAD, and total proximal RCA occlusion.  LVEDP 14 mm  RECOMMENDATION:  Urgent Surgical consultation will be obtained for CABG revascularization.  A stat P2Y12 blood test was sent from the laboratory to assess the antiplatelet effect of his self prescribed 8 years outdated Plavix with his last dose 2 days ago.   Intervention   IMPRESSION:  1. Chronic low back pain without sciatica, unspecified back pain laterality   2. Coronary artery disease involving native coronary artery of native heart with unstable angina pectoris ([facility])   3. S/P CABG x 4   4. Essential hypertension   5. Familial hyperlipidemia   6. Lower extremity edema     ASSESSMENT AND PLAN: Mr. Bertram Haddix is a 73 year-old gentleman who has a history of heterozygous familial hyperlipidemia and was initially noted to have cholesterols in excess of 400 in the 1970s.  His father and brother also has similar history.  His 2 children do not have this disorder.  He has been on aggressive lipid-lowering therapy ever since statins were initially introduced and had participated in the early clinical trials of lovastatin and simvastatin.  He had previously been demonstrated  to have coronary atherosclerosis by cardiac catheterization in the 1980s.  In 2016 prior to undergoing hip surgery a four-year follow-up nuclear perfusion study continued to be low risk and did not reveal any significant perfusion abnormalities.  His familial hyperlipidemia has been treated with Praluent 150 mg every 14 days, rosuvastatin 40 mg, Zetia 10 mg in addition to omega-3 fatty acid.  When I had  seen him I discontinued niacin.  Lipid studies were outstanding and most recent evaluation showed a total cholesterol 114 with an LDL cholesterol of 25.  Triglycerides were 42 with HDL 81.  Apo lipoprotein B was 32.  Over the late spring and early summer, Mr. O'Meara developed progressive exertional dyspnea.  An echo Doppler study on September 11/2018 showed an EF at 60 to 65%.  A nuclear perfusion study was very high risk and EF post-rest was 39% and there was evidence for transient ischemic dilatation with ischemia in several vascular distributions.  Cardiac catheterization revealed life-threatening anatomy as noted above and the following day he underwent urgent coronary revascularization by Dr. Kipp Brood successfully.  Following his surgery, he had had issues with fever increased cough and shortness of breath and had several courses of antibiotic therapy with improvement.   He tested negative for Covid.  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  Presently, he continues to feel well.  His blood pressure on recheck by me today was 130/82 and he continues now to take furosemide only on an as-needed basis and continues to be on metoprolol succinate 25 mg daily and telmisartan 80 mg for hypertension control.  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  LDL cholesterol in  February 2021 was 19.  He has recently developed  chronic back pain for which she saw Dr. Marcello Moores.  He had been on Celebrex which was initiated after diclofenac was not helpful.  He received a steroid injection today by Dr. Vertell Limber at L3-4-5 region.  At times he still notes some mild ankle swelling.  I have suggested that he can take HCTZ 12.5 mg every other or every third day depending upon blood pressure with target blood pressure less than 130/80 and ideal blood pressure less than 120/80.  I have recommended follow-up laboratory particularly his renal function on Celebrex.  I will see him in 6 months for follow-up evaluation or sooner as necessary.  Troy Sine, MD, [facility] 09/13/2020 3:56 PM

## 2020-09-13 ENCOUNTER — Encounter: Payer: Self-pay | Admitting: Cardiovascular Disease

## 2020-09-19 DIAGNOSIS — E785 Hyperlipidemia, unspecified: Secondary | ICD-10-CM | POA: Diagnosis not present

## 2020-09-19 DIAGNOSIS — Z800 Family history of malignant neoplasm of digestive organs: Secondary | ICD-10-CM | POA: Diagnosis not present

## 2020-09-19 DIAGNOSIS — I2510 Atherosclerotic heart disease of native coronary artery without angina pectoris: Secondary | ICD-10-CM | POA: Diagnosis not present

## 2020-09-19 DIAGNOSIS — R808 Other proteinuria: Secondary | ICD-10-CM | POA: Diagnosis not present

## 2020-09-19 DIAGNOSIS — N401 Enlarged prostate with lower urinary tract symptoms: Secondary | ICD-10-CM | POA: Diagnosis not present

## 2020-09-19 DIAGNOSIS — Z0000 Encounter for general adult medical examination without abnormal findings: Secondary | ICD-10-CM | POA: Diagnosis not present

## 2020-09-19 DIAGNOSIS — I10 Essential (primary) hypertension: Secondary | ICD-10-CM | POA: Diagnosis not present

## 2020-09-19 DIAGNOSIS — N529 Male erectile dysfunction, unspecified: Secondary | ICD-10-CM | POA: Diagnosis not present

## 2020-09-19 DIAGNOSIS — M47896 Other spondylosis, lumbar region: Secondary | ICD-10-CM | POA: Diagnosis not present

## 2020-09-19 DIAGNOSIS — R748 Abnormal levels of other serum enzymes: Secondary | ICD-10-CM | POA: Diagnosis not present

## 2020-09-24 DIAGNOSIS — M545 Low back pain, unspecified: Secondary | ICD-10-CM | POA: Diagnosis not present

## 2020-09-24 DIAGNOSIS — M9903 Segmental and somatic dysfunction of lumbar region: Secondary | ICD-10-CM | POA: Diagnosis not present

## 2020-09-24 DIAGNOSIS — M9905 Segmental and somatic dysfunction of pelvic region: Secondary | ICD-10-CM | POA: Diagnosis not present

## 2020-09-24 DIAGNOSIS — M9901 Segmental and somatic dysfunction of cervical region: Secondary | ICD-10-CM | POA: Diagnosis not present

## 2020-10-02 ENCOUNTER — Other Ambulatory Visit: Payer: Self-pay | Admitting: Cardiovascular Disease

## 2020-10-03 ENCOUNTER — Other Ambulatory Visit: Payer: Self-pay | Admitting: Cardiovascular Disease

## 2020-10-08 DIAGNOSIS — M48061 Spinal stenosis, lumbar region without neurogenic claudication: Secondary | ICD-10-CM | POA: Diagnosis not present

## 2020-10-08 DIAGNOSIS — Z951 Presence of aortocoronary bypass graft: Secondary | ICD-10-CM | POA: Diagnosis not present

## 2020-10-08 DIAGNOSIS — Z6833 Body mass index (BMI) 33.0-33.9, adult: Secondary | ICD-10-CM | POA: Diagnosis not present

## 2020-10-08 DIAGNOSIS — I10 Essential (primary) hypertension: Secondary | ICD-10-CM | POA: Diagnosis not present

## 2020-10-08 LAB — CBC
Hematocrit: 39.9 % (ref 37.5–51.0)
Hemoglobin: 13.5 g/dL (ref 13.0–17.7)
MCH: 33.7 pg — ABNORMAL HIGH (ref 26.6–33.0)
MCHC: 33.8 g/dL (ref 31.5–35.7)
MCV: 100 fL — ABNORMAL HIGH (ref 79–97)
Platelets: 142 10*3/uL — ABNORMAL LOW (ref 150–450)
RBC: 4.01 x10E6/uL — ABNORMAL LOW (ref 4.14–5.80)
RDW: 13.1 % (ref 11.6–15.4)
WBC: 5.1 10*3/uL (ref 3.4–10.8)

## 2020-10-08 LAB — COMPREHENSIVE METABOLIC PANEL
ALT: 18 IU/L (ref 0–44)
AST: 25 IU/L (ref 0–40)
Albumin/Globulin Ratio: 2.3 — ABNORMAL HIGH (ref 1.2–2.2)
Albumin: 4.5 g/dL (ref 3.7–4.7)
Alkaline Phosphatase: 54 IU/L (ref 44–121)
BUN/Creatinine Ratio: 12 (ref 10–24)
BUN: 14 mg/dL (ref 8–27)
Bilirubin Total: 0.8 mg/dL (ref 0.0–1.2)
CO2: 28 mmol/L (ref 20–29)
Calcium: 9.5 mg/dL (ref 8.6–10.2)
Chloride: 102 mmol/L (ref 96–106)
Creatinine, Ser: 1.16 mg/dL (ref 0.76–1.27)
GFR calc Af Amer: 71 mL/min/{1.73_m2} (ref >59)
GFR calc non Af Amer: 61 mL/min/{1.73_m2} (ref >59)
Globulin, Total: 2 g/dL (ref 1.5–4.5)
Glucose: 97 mg/dL (ref 65–99)
Potassium: 4.8 mmol/L (ref 3.5–5.2)
Sodium: 141 mmol/L (ref 134–144)
Total Protein: 6.5 g/dL (ref 6.0–8.5)

## 2020-10-08 LAB — LIPID PANEL
Chol/HDL Ratio: 1.5 ratio (ref 0.0–5.0)
Cholesterol, Total: 129 mg/dL (ref 100–199)
HDL: 88 mg/dL (ref >39)
LDL Chol Calc (NIH): 29 mg/dL (ref 0–99)
Triglycerides: 56 mg/dL (ref 0–149)
VLDL Cholesterol Cal: 12 mg/dL (ref 5–40)

## 2020-10-08 LAB — TSH: TSH: 2.61 u[IU]/mL (ref 0.450–4.500)

## 2020-10-10 ENCOUNTER — Ambulatory Visit (HOSPITAL_COMMUNITY): Payer: Medicare HMO | Attending: Cardiovascular Disease

## 2020-10-10 ENCOUNTER — Other Ambulatory Visit: Payer: Self-pay

## 2020-10-10 DIAGNOSIS — I10 Essential (primary) hypertension: Secondary | ICD-10-CM

## 2020-10-10 DIAGNOSIS — Z951 Presence of aortocoronary bypass graft: Secondary | ICD-10-CM | POA: Diagnosis present

## 2020-10-10 LAB — ECHOCARDIOGRAM COMPLETE
Area-P 1/2: 5.54 cm2
S' Lateral: 3.5 cm

## 2020-10-22 DIAGNOSIS — M9905 Segmental and somatic dysfunction of pelvic region: Secondary | ICD-10-CM | POA: Diagnosis not present

## 2020-10-22 DIAGNOSIS — M9903 Segmental and somatic dysfunction of lumbar region: Secondary | ICD-10-CM | POA: Diagnosis not present

## 2020-10-22 DIAGNOSIS — M545 Low back pain, unspecified: Secondary | ICD-10-CM | POA: Diagnosis not present

## 2020-10-22 DIAGNOSIS — M9901 Segmental and somatic dysfunction of cervical region: Secondary | ICD-10-CM | POA: Diagnosis not present

## 2020-10-25 DIAGNOSIS — I440 Atrioventricular block, first degree: Secondary | ICD-10-CM | POA: Diagnosis not present

## 2020-10-25 DIAGNOSIS — R55 Syncope and collapse: Secondary | ICD-10-CM | POA: Diagnosis not present

## 2020-10-25 DIAGNOSIS — R0902 Hypoxemia: Secondary | ICD-10-CM | POA: Diagnosis not present

## 2020-10-25 DIAGNOSIS — I959 Hypotension, unspecified: Secondary | ICD-10-CM | POA: Diagnosis not present

## 2020-10-25 DIAGNOSIS — I4430 Unspecified atrioventricular block: Secondary | ICD-10-CM | POA: Diagnosis not present

## 2020-11-06 DIAGNOSIS — J40 Bronchitis, not specified as acute or chronic: Secondary | ICD-10-CM | POA: Diagnosis not present

## 2020-11-06 DIAGNOSIS — J329 Chronic sinusitis, unspecified: Secondary | ICD-10-CM | POA: Diagnosis not present

## 2020-11-15 DIAGNOSIS — M545 Low back pain, unspecified: Secondary | ICD-10-CM | POA: Diagnosis not present

## 2020-11-15 DIAGNOSIS — M48061 Spinal stenosis, lumbar region without neurogenic claudication: Secondary | ICD-10-CM | POA: Diagnosis not present

## 2020-11-19 DIAGNOSIS — M9901 Segmental and somatic dysfunction of cervical region: Secondary | ICD-10-CM | POA: Diagnosis not present

## 2020-11-19 DIAGNOSIS — M9903 Segmental and somatic dysfunction of lumbar region: Secondary | ICD-10-CM | POA: Diagnosis not present

## 2020-11-19 DIAGNOSIS — M545 Low back pain, unspecified: Secondary | ICD-10-CM | POA: Diagnosis not present

## 2020-11-19 DIAGNOSIS — M9905 Segmental and somatic dysfunction of pelvic region: Secondary | ICD-10-CM | POA: Diagnosis not present

## 2020-11-22 DIAGNOSIS — M545 Low back pain, unspecified: Secondary | ICD-10-CM | POA: Diagnosis not present

## 2020-11-22 DIAGNOSIS — M48061 Spinal stenosis, lumbar region without neurogenic claudication: Secondary | ICD-10-CM | POA: Diagnosis not present

## 2020-12-02 DIAGNOSIS — M545 Low back pain, unspecified: Secondary | ICD-10-CM | POA: Diagnosis not present

## 2020-12-02 DIAGNOSIS — M48061 Spinal stenosis, lumbar region without neurogenic claudication: Secondary | ICD-10-CM | POA: Diagnosis not present

## 2020-12-09 DIAGNOSIS — M48061 Spinal stenosis, lumbar region without neurogenic claudication: Secondary | ICD-10-CM | POA: Diagnosis not present

## 2020-12-09 DIAGNOSIS — M545 Low back pain, unspecified: Secondary | ICD-10-CM | POA: Diagnosis not present

## 2020-12-16 DIAGNOSIS — M48061 Spinal stenosis, lumbar region without neurogenic claudication: Secondary | ICD-10-CM | POA: Diagnosis not present

## 2020-12-16 DIAGNOSIS — M545 Low back pain, unspecified: Secondary | ICD-10-CM | POA: Diagnosis not present

## 2020-12-17 DIAGNOSIS — M9905 Segmental and somatic dysfunction of pelvic region: Secondary | ICD-10-CM | POA: Diagnosis not present

## 2020-12-17 DIAGNOSIS — M545 Low back pain, unspecified: Secondary | ICD-10-CM | POA: Diagnosis not present

## 2020-12-17 DIAGNOSIS — M9901 Segmental and somatic dysfunction of cervical region: Secondary | ICD-10-CM | POA: Diagnosis not present

## 2020-12-17 DIAGNOSIS — M9903 Segmental and somatic dysfunction of lumbar region: Secondary | ICD-10-CM | POA: Diagnosis not present

## 2020-12-23 DIAGNOSIS — M545 Low back pain, unspecified: Secondary | ICD-10-CM | POA: Diagnosis not present

## 2020-12-23 DIAGNOSIS — M48061 Spinal stenosis, lumbar region without neurogenic claudication: Secondary | ICD-10-CM | POA: Diagnosis not present

## 2021-01-02 DIAGNOSIS — M48061 Spinal stenosis, lumbar region without neurogenic claudication: Secondary | ICD-10-CM | POA: Diagnosis not present

## 2021-01-02 DIAGNOSIS — M545 Low back pain, unspecified: Secondary | ICD-10-CM | POA: Diagnosis not present

## 2021-01-03 DIAGNOSIS — M545 Low back pain, unspecified: Secondary | ICD-10-CM | POA: Diagnosis not present

## 2021-01-07 DIAGNOSIS — M47816 Spondylosis without myelopathy or radiculopathy, lumbar region: Secondary | ICD-10-CM | POA: Diagnosis not present

## 2021-01-14 DIAGNOSIS — M9903 Segmental and somatic dysfunction of lumbar region: Secondary | ICD-10-CM | POA: Diagnosis not present

## 2021-01-14 DIAGNOSIS — M9905 Segmental and somatic dysfunction of pelvic region: Secondary | ICD-10-CM | POA: Diagnosis not present

## 2021-01-14 DIAGNOSIS — M545 Low back pain, unspecified: Secondary | ICD-10-CM | POA: Diagnosis not present

## 2021-01-14 DIAGNOSIS — M9901 Segmental and somatic dysfunction of cervical region: Secondary | ICD-10-CM | POA: Diagnosis not present

## 2021-01-21 DIAGNOSIS — M47816 Spondylosis without myelopathy or radiculopathy, lumbar region: Secondary | ICD-10-CM | POA: Diagnosis not present

## 2021-01-27 ENCOUNTER — Other Ambulatory Visit: Payer: Self-pay

## 2021-01-27 MED ORDER — HYDROCHLOROTHIAZIDE 12.5 MG PO CAPS
12.5000 mg | ORAL_CAPSULE | ORAL | 1 refills | Status: DC
Start: 2021-01-27 — End: 2022-02-26

## 2021-02-11 DIAGNOSIS — M9903 Segmental and somatic dysfunction of lumbar region: Secondary | ICD-10-CM | POA: Diagnosis not present

## 2021-02-11 DIAGNOSIS — M9905 Segmental and somatic dysfunction of pelvic region: Secondary | ICD-10-CM | POA: Diagnosis not present

## 2021-02-11 DIAGNOSIS — M545 Low back pain, unspecified: Secondary | ICD-10-CM | POA: Diagnosis not present

## 2021-02-11 DIAGNOSIS — M9901 Segmental and somatic dysfunction of cervical region: Secondary | ICD-10-CM | POA: Diagnosis not present

## 2021-02-17 DIAGNOSIS — M47816 Spondylosis without myelopathy or radiculopathy, lumbar region: Secondary | ICD-10-CM | POA: Diagnosis not present

## 2021-02-27 MED ORDER — TELMISARTAN 80 MG PO TABS
80.0000 mg | ORAL_TABLET | Freq: Every day | ORAL | 1 refills | Status: DC
Start: 2021-02-27 — End: 2021-08-25

## 2021-03-05 DIAGNOSIS — K529 Noninfective gastroenteritis and colitis, unspecified: Secondary | ICD-10-CM | POA: Diagnosis not present

## 2021-03-09 ENCOUNTER — Other Ambulatory Visit: Payer: Self-pay | Admitting: Cardiovascular Disease

## 2021-03-11 DIAGNOSIS — M545 Low back pain, unspecified: Secondary | ICD-10-CM | POA: Diagnosis not present

## 2021-03-11 DIAGNOSIS — M9905 Segmental and somatic dysfunction of pelvic region: Secondary | ICD-10-CM | POA: Diagnosis not present

## 2021-03-11 DIAGNOSIS — M9901 Segmental and somatic dysfunction of cervical region: Secondary | ICD-10-CM | POA: Diagnosis not present

## 2021-03-11 DIAGNOSIS — M9903 Segmental and somatic dysfunction of lumbar region: Secondary | ICD-10-CM | POA: Diagnosis not present

## 2021-04-15 DIAGNOSIS — M9901 Segmental and somatic dysfunction of cervical region: Secondary | ICD-10-CM | POA: Diagnosis not present

## 2021-04-15 DIAGNOSIS — M9905 Segmental and somatic dysfunction of pelvic region: Secondary | ICD-10-CM | POA: Diagnosis not present

## 2021-04-15 DIAGNOSIS — M545 Low back pain, unspecified: Secondary | ICD-10-CM | POA: Diagnosis not present

## 2021-04-15 DIAGNOSIS — M9903 Segmental and somatic dysfunction of lumbar region: Secondary | ICD-10-CM | POA: Diagnosis not present

## 2021-04-29 DIAGNOSIS — M47816 Spondylosis without myelopathy or radiculopathy, lumbar region: Secondary | ICD-10-CM | POA: Diagnosis not present

## 2021-05-27 DIAGNOSIS — M9903 Segmental and somatic dysfunction of lumbar region: Secondary | ICD-10-CM | POA: Diagnosis not present

## 2021-05-27 DIAGNOSIS — M9905 Segmental and somatic dysfunction of pelvic region: Secondary | ICD-10-CM | POA: Diagnosis not present

## 2021-05-27 DIAGNOSIS — M9901 Segmental and somatic dysfunction of cervical region: Secondary | ICD-10-CM | POA: Diagnosis not present

## 2021-05-27 DIAGNOSIS — M545 Low back pain, unspecified: Secondary | ICD-10-CM | POA: Diagnosis not present

## 2021-05-27 DIAGNOSIS — M47816 Spondylosis without myelopathy or radiculopathy, lumbar region: Secondary | ICD-10-CM | POA: Diagnosis not present

## 2021-06-19 DIAGNOSIS — M9903 Segmental and somatic dysfunction of lumbar region: Secondary | ICD-10-CM | POA: Diagnosis not present

## 2021-06-19 DIAGNOSIS — M545 Low back pain, unspecified: Secondary | ICD-10-CM | POA: Diagnosis not present

## 2021-06-19 DIAGNOSIS — M9901 Segmental and somatic dysfunction of cervical region: Secondary | ICD-10-CM | POA: Diagnosis not present

## 2021-06-19 DIAGNOSIS — M9905 Segmental and somatic dysfunction of pelvic region: Secondary | ICD-10-CM | POA: Diagnosis not present

## 2021-07-10 DIAGNOSIS — Z0000 Encounter for general adult medical examination without abnormal findings: Secondary | ICD-10-CM | POA: Diagnosis not present

## 2021-07-11 DIAGNOSIS — M47816 Spondylosis without myelopathy or radiculopathy, lumbar region: Secondary | ICD-10-CM | POA: Diagnosis not present

## 2021-07-16 DIAGNOSIS — Z0000 Encounter for general adult medical examination without abnormal findings: Secondary | ICD-10-CM | POA: Diagnosis not present

## 2021-07-16 DIAGNOSIS — Z125 Encounter for screening for malignant neoplasm of prostate: Secondary | ICD-10-CM | POA: Diagnosis not present

## 2021-07-16 DIAGNOSIS — I10 Essential (primary) hypertension: Secondary | ICD-10-CM | POA: Diagnosis not present

## 2021-07-16 DIAGNOSIS — N529 Male erectile dysfunction, unspecified: Secondary | ICD-10-CM | POA: Diagnosis not present

## 2021-07-16 DIAGNOSIS — N182 Chronic kidney disease, stage 2 (mild): Secondary | ICD-10-CM | POA: Diagnosis not present

## 2021-07-16 DIAGNOSIS — E785 Hyperlipidemia, unspecified: Secondary | ICD-10-CM | POA: Diagnosis not present

## 2021-07-16 DIAGNOSIS — M109 Gout, unspecified: Secondary | ICD-10-CM | POA: Diagnosis not present

## 2021-07-17 DIAGNOSIS — M9901 Segmental and somatic dysfunction of cervical region: Secondary | ICD-10-CM | POA: Diagnosis not present

## 2021-07-17 DIAGNOSIS — M9903 Segmental and somatic dysfunction of lumbar region: Secondary | ICD-10-CM | POA: Diagnosis not present

## 2021-07-17 DIAGNOSIS — M9905 Segmental and somatic dysfunction of pelvic region: Secondary | ICD-10-CM | POA: Diagnosis not present

## 2021-07-17 DIAGNOSIS — M545 Low back pain, unspecified: Secondary | ICD-10-CM | POA: Diagnosis not present

## 2021-07-31 DIAGNOSIS — M47816 Spondylosis without myelopathy or radiculopathy, lumbar region: Secondary | ICD-10-CM | POA: Diagnosis not present

## 2021-08-21 DIAGNOSIS — M9905 Segmental and somatic dysfunction of pelvic region: Secondary | ICD-10-CM | POA: Diagnosis not present

## 2021-08-21 DIAGNOSIS — M9901 Segmental and somatic dysfunction of cervical region: Secondary | ICD-10-CM | POA: Diagnosis not present

## 2021-08-21 DIAGNOSIS — M545 Low back pain, unspecified: Secondary | ICD-10-CM | POA: Diagnosis not present

## 2021-08-21 DIAGNOSIS — M9903 Segmental and somatic dysfunction of lumbar region: Secondary | ICD-10-CM | POA: Diagnosis not present

## 2021-08-23 ENCOUNTER — Other Ambulatory Visit: Payer: Self-pay | Admitting: Cardiovascular Disease

## 2021-09-01 DIAGNOSIS — M47816 Spondylosis without myelopathy or radiculopathy, lumbar region: Secondary | ICD-10-CM | POA: Diagnosis not present

## 2021-09-03 IMAGING — DX DG CHEST 1V PORT
1 series · 1 of 1 positions shown · non-contrast
Comparison: None.

CLINICAL DATA: Preop evaluation for upcoming heart surgery,
shortness of breath

EXAM:
PORTABLE CHEST 1 VIEW

[chest]
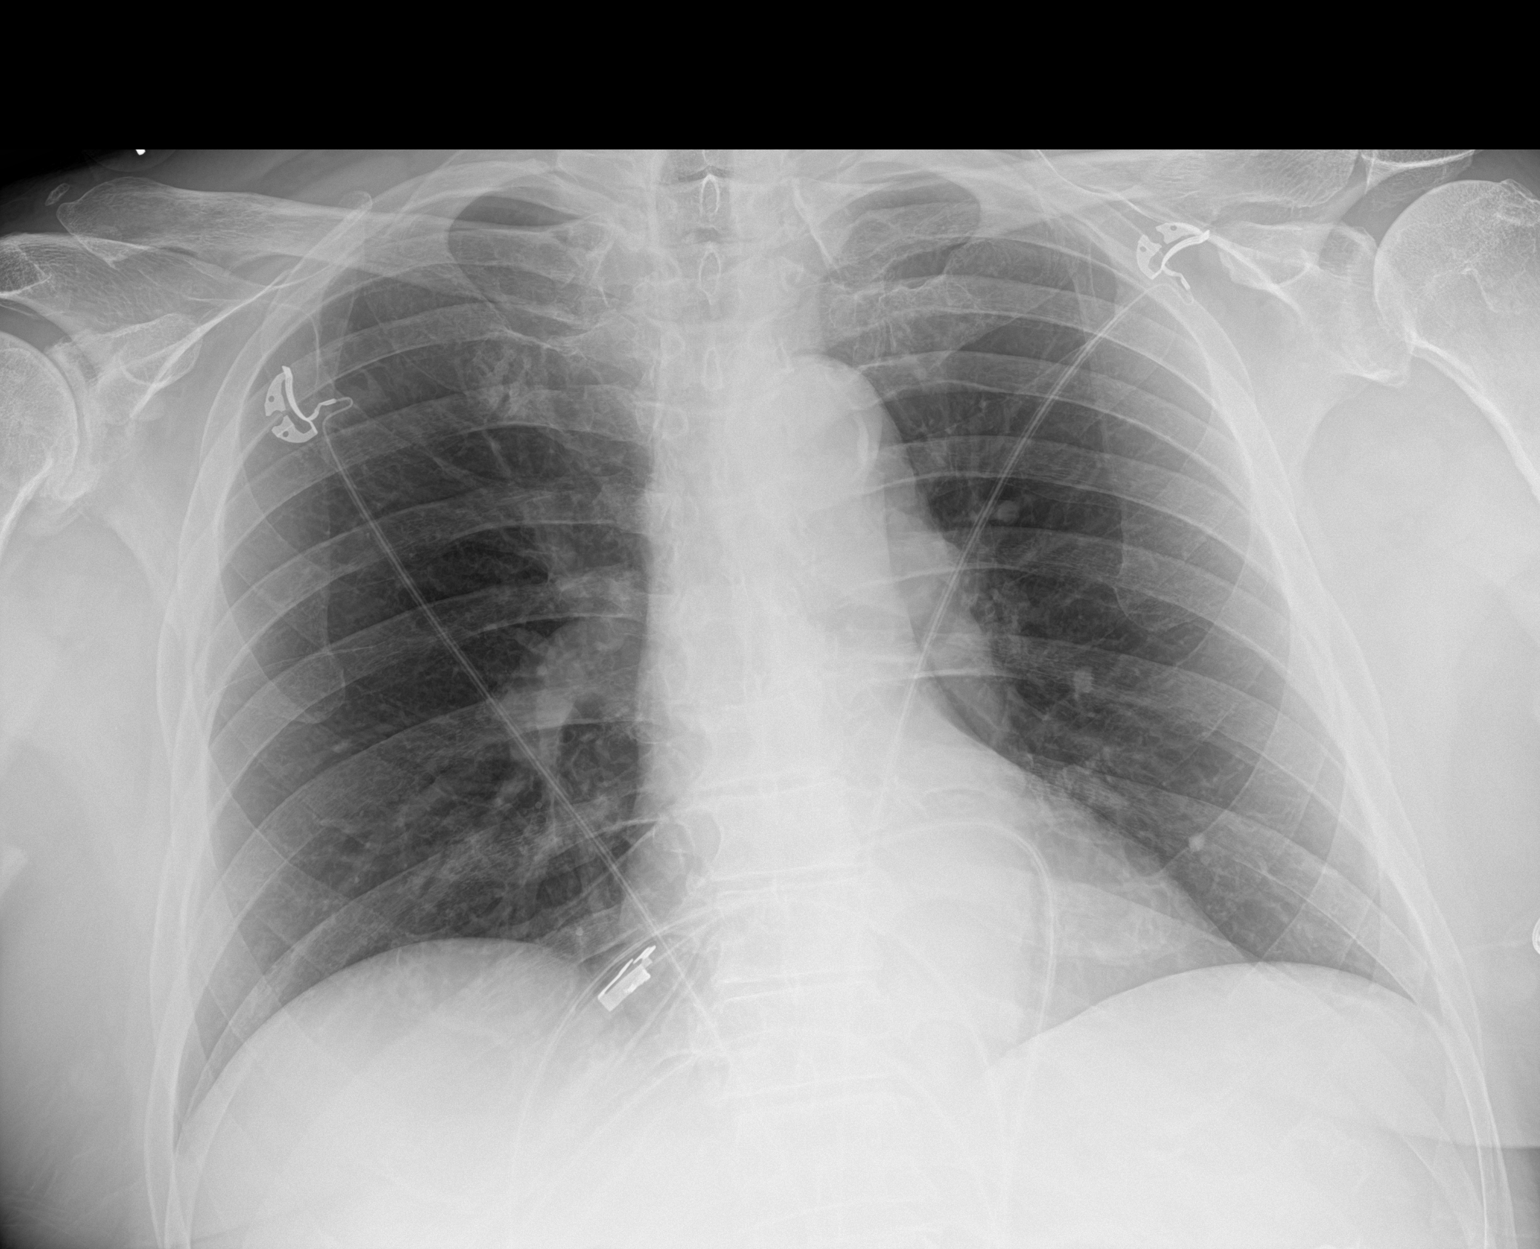

[1 of 1 positions shown; findings below may reference images not displayed]

FINDINGS: The heart size and mediastinal contours are within normal limits.
Both lungs are clear. The visualized skeletal structures are
unremarkable.
IMPRESSION: No active disease.

## 2021-09-06 IMAGING — DX DG CHEST 1V PORT
1 series · 1 of 1 positions shown · non-contrast
Comparison: 09/02/2019

CLINICAL DATA: Status post CABG

EXAM:
PORTABLE CHEST 1 VIEW

[chest ap]
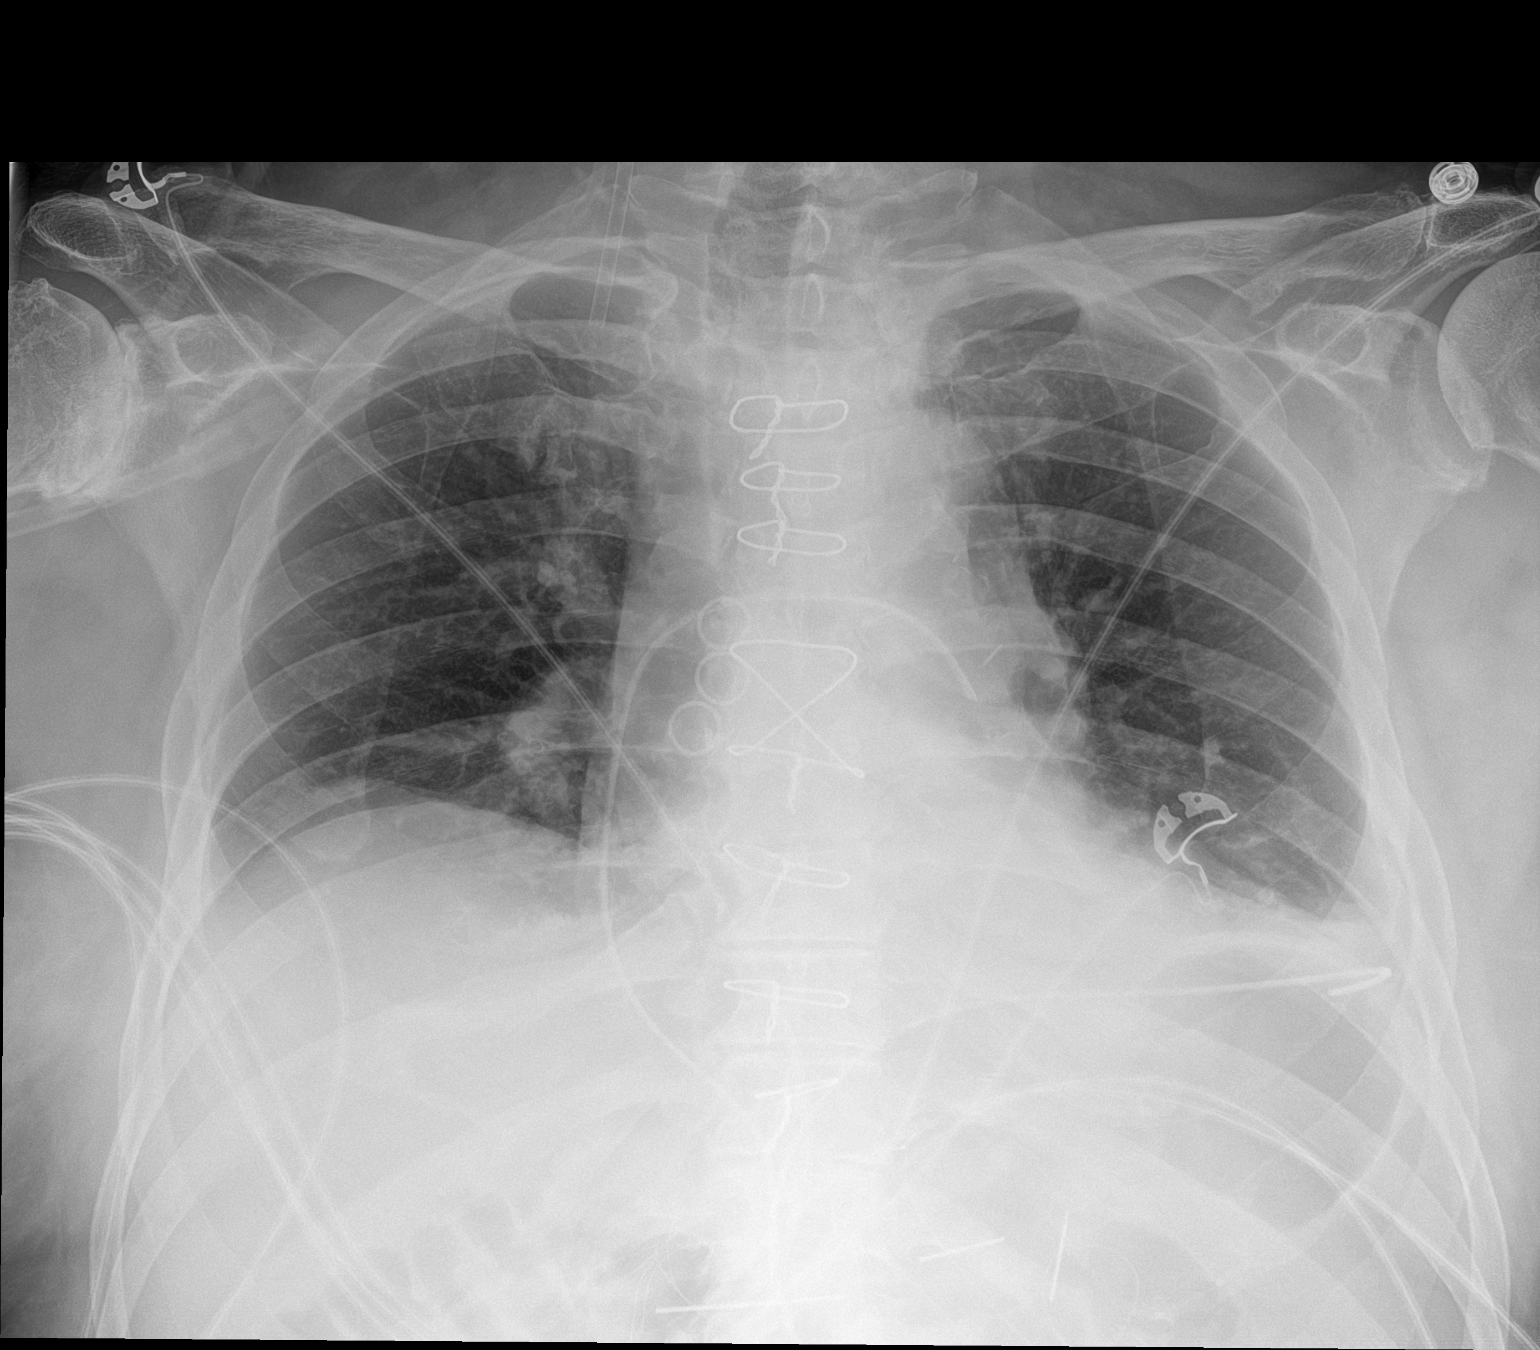

[1 of 1 positions shown; findings below may reference images not displayed]

FINDINGS: Interval removal of right neck pulmonary arterial catheter, with
right neck vascular sheath remaining in position. Otherwise
unchanged examination status post median sternotomy with mediastinal
and left-sided chest tubes in position. No significant pneumothorax.
Bibasilar atelectasis and small pleural effusions.
IMPRESSION: 1. Interval removal of right neck pulmonary arterial catheter, with
right neck vascular sheath remaining in position.

2. Otherwise unchanged examination status post median sternotomy
with mediastinal and left-sided chest tubes in position.

## 2021-09-10 NOTE — Progress Notes (Signed)
Cardiology Clinic Note   Patient Name: [name] [name] Date of Encounter: 09/12/2021  Primary Care Provider:  Hulan Fess, MD Primary Cardiologist:  Shelva Majestic, MD  Patient Profile    [name] [name] 75 year old male presents the clinic today for follow-up evaluation of his coronary artery disease.  Past Medical History    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   left post shoulder-sup ([id number]), left post leg    Coronary artery disease    Gout    Of the left third, fourth, and fifth MTPareas with swelling of left foot and pain   Hyperlipidemia    Hypertension    Past Surgical History:  Procedure Laterality Date   CARDIAC CATHETERIZATION  01/04/1986   CORONARY ARTERY BYPASS GRAFT N/A 09/01/2019   Procedure: CORONARY ARTERY BYPASS GRAFTING (CABG) x Four , using left internal mammary artery and right leg greater saphenous vein harvested endoscopically;  Surgeon: Lajuana Matte, MD;  Location: [place];  Service: Open Heart Surgery;  Laterality: N/A;   LEFT HEART CATH AND CORONARY ANGIOGRAPHY N/A 08/31/2019   Procedure: LEFT HEART CATH AND CORONARY ANGIOGRAPHY;  Surgeon: Troy Sine, MD;  Location: [facility];  Service: Cardiovascular;  Laterality: N/A;   TEE WITHOUT CARDIOVERSION N/A 09/01/2019   Procedure: Transesophageal Echocardiogram (Tee);  Surgeon: Lajuana Matte, MD;  Location: [place];  Service: Open Heart Surgery;  Laterality: N/A;    Allergies  No Known Allergies  History of Present Illness    [name] [name] coronary artery disease, unstable angina, essential hypertension, hyperlipidemia, morbid obesity, and status post CABG x4 on 09/01/19 (Dr. Kipp Brood).  Echocardiogram 10/10/2020 showed 55-60% LV function, mild calcification on the aortic valve with no stenosis.  He was seen by Dr. Claiborne Billings 09/29/2019.  During that time he did note  some residual swelling in his right lower extremity.  He had previously been on furosemide 40 mg daily however it had been discontinued.  He continued his Praluent, rosuvastatin, and ezetimibe.  He denied palpitations, presyncope and syncope.  Dr. Claiborne Billings reordered furosemide 20 mg for 5-7 days and then as needed.  He changed his metoprolol succinate to 25 mg daily.  He was seen again by Dr. Claiborne Billings in follow-up on 09/12/2020.  During that time he continued to be stable.  He reported significant benefit from furosemide and has been taking it 2 times per week.  He continued to experience mild right lower extremity swelling.  Post CABG he noted improved breathing and less dyspnea with exertion.  He reported that he had never experienced significant chest pressure.  He was continuing cardiac rehab at [facility].  His lipid panel was reviewed which showed an LDL of 19 on 01/01/2020.  He denied palpitations presyncope and syncope.  He presents to the clinic today for follow-up evaluation states he has noticed some dry heaving/nausea in the mornings 3-4 times a week for the last year.  He has been working with his primary doctor and is now taking Prilosec for 14 days to see if acid reflux is the cause.  He previously thought that it may be related to sinus drainage and allergies.  However, he continues to take allergy medication and has done nasal rinses which is not helped with his symptoms.  He notices elevated blood pressure in the morning in the 150 160 range.  We reviewed his  blood pressure medication.  I will have him take amlodipine 2.5 mg in the evening and continue his other current medications.  We will give him a salty 6 diet sheet and have him increase his physical activity as tolerated.  He stays fairly active helping 3-4 times a week 18 holes.  I will have him follow-up in 3 to 4 months and maintain a blood pressure log.  He brings in his lab work today which we reviewed.  Today he denies chest  pain, shortness of breath, lower extremity edema, fatigue, palpitations, melena, hematuria, hemoptysis, diaphoresis, weakness, presyncope, syncope, orthopnea, and PND.   Home Medications    Prior to Admission medications   Medication Sig Start Date End Date Taking? Authorizing Provider  aspirin EC 81 MG tablet Take 81 mg by mouth daily.    [provider]  celecoxib (CELEBREX) 100 MG capsule Take 100 mg by mouth 2 (two) times daily.    [provider]  ezetimibe (ZETIA) 10 MG tablet TAKE 1 TABLET BY MOUTH  DAILY Patient taking differently: Take 10 mg by mouth every evening.  07/11/19   Troy Sine, MD  furosemide (LASIX) 20 MG tablet TAKE 1 TABLET BY MOUTH EVERY DAY 10/02/20   Troy Sine, MD  hydrochlorothiazide (MICROZIDE) 12.5 MG capsule Take 1 capsule (12.5 mg total) by mouth every other day. Every other day to every 3rd day depending on swelling and blood pressure. 01/27/21   Troy Sine, MD  ipratropium (ATROVENT) 0.03 % nasal spray Place into both nostrils 2 (two) times daily.  07/04/20   [provider]  loratadine (CLARITIN) 10 MG tablet TAKE 1 TABLET BY MOUTH EVERY DAY 03/15/20   Lendon Colonel, NP  metoprolol succinate (TOPROL-XL) 25 MG 24 hr tablet TAKE 1 TABLET BY MOUTH EVERY DAY 10/04/20   Troy Sine, MD  Oxymetazoline HCl (NASAL SPRAY) 0.05 % SOLN Place 1 spray into the nose daily as needed (congestion).    [provider]  PRALUENT 150 MG/ML SOAJ INJECT 150MG  INTO THE SKIN EVERY 14 DAYS 03/10/21   Troy Sine, MD  pregabalin (LYRICA) 75 MG capsule Take 75 mg by mouth 2 (two) times daily.    [provider]  rosuvastatin (CRESTOR) 40 MG tablet TAKE 1 TABLET BY MOUTH  DAILY 01/30/20   Troy Sine, MD  telmisartan (MICARDIS) 80 MG tablet Take 1 tablet (80 mg total) by mouth daily. SCHEDULE OFFICE VISIT FOR FUTURE REFILLS 08/25/21   Troy Sine, MD    Family History    Family History  Problem Relation Age of  Onset   Heart attack Father    Hyperlipidemia Father    Heart disease Brother    Hyperlipidemia Brother    He indicated that his father is deceased. He indicated that his brother is alive. He indicated that his maternal grandmother is deceased. He indicated that his maternal grandfather is deceased.  Social History    Social History   Socioeconomic History   Marital status: Married    Spouse name: Not on file   Number of children: Not on file   Years of education: Not on file   Highest education level: Master's degree (e.g., MA, MS, MEng, MEd, MSW, MBA)  Occupational History   Not on file  Tobacco Use   Smoking status: Never   Smokeless tobacco: Never  Substance and Sexual Activity   Alcohol use: Yes    Alcohol/week: 5.0 standard drinks    Types:  5 drink(s) per week    Comment: Scothc or wine   Drug use: Not on file   Sexual activity: Not on file  Other Topics Concern   Not on file  Social History Narrative   Not on file   Social Determinants of Health   Financial Resource Strain: Not on file  Food Insecurity: Not on file  Transportation Needs: Not on file  Physical Activity: Not on file  Stress: Not on file  Social Connections: Not on file  Intimate Partner Violence: Not on file     Review of Systems    General:  No chills, fever, night sweats or weight changes.  Cardiovascular:  No chest pain, dyspnea on exertion, edema, orthopnea, palpitations, paroxysmal nocturnal dyspnea. Dermatological: No rash, lesions/masses Respiratory: No cough, dyspnea Urologic: No hematuria, dysuria Abdominal:   No nausea, vomiting, diarrhea, bright red blood per rectum, melena, or hematemesis Neurologic:  No visual changes, wkns, changes in mental status. All other systems reviewed and are otherwise negative except as noted above.  Physical Exam    VS:  BP 130/80   Pulse 79   Ht 5' 8.5" (1.74 m)   Wt 217 lb (98.4 kg)   SpO2 97%   BMI 32.51 kg/m  , BMI Body mass index is  32.51 kg/m. GEN: Well nourished, well developed, in no acute distress. HEENT: normal. Neck: Supple, no JVD, carotid bruits, or masses. Cardiac: RRR, no murmurs, rubs, or gallops. No clubbing, cyanosis, edema.  Radials/DP/PT 2+ and equal bilaterally.  Respiratory:  Respirations regular and unlabored, clear to auscultation bilaterally. GI: Soft, nontender, nondistended, BS + x 4. MS: no deformity or atrophy. Skin: warm and dry, no rash. Neuro:  Strength and sensation are intact. Psych: Normal affect.  Accessory Clinical Findings    Recent Labs: 10/08/2020: ALT 18; BUN 14; Creatinine, Ser 1.16; Hemoglobin 13.5; Platelets 142; Potassium 4.8; Sodium 141; TSH 2.610   Recent Lipid Panel    Component Value Date/Time   CHOL 129 10/08/2020 1116   CHOL 153 12/20/2015 0949   TRIG 56 10/08/2020 1116   TRIG 45 12/20/2015 0949   TRIG 72 12/06/2014 0944   HDL 88 10/08/2020 1116   HDL 76 12/20/2015 0949   HDL 94 12/06/2014 0944   CHOLHDL 1.5 10/08/2020 1116   CHOLHDL 2.0 12/20/2015 0949   LDLCALC 29 10/08/2020 1116   LDLCALC 68 12/20/2015 0949    ECG personally reviewed by me today-sinus rhythm first-degree AV block 73 bpm-no acute changes.  Echocardiogram 10/10/2020 IMPRESSIONS     1. Left ventricular ejection fraction, by estimation, is 55 to 60%. Left  ventricular ejection fraction by 3D volume is 56 %. The left ventricle has  normal function. The left ventricle has no regional wall motion  abnormalities. Left ventricular diastolic   function could not be evaluated. The average left ventricular global  longitudinal strain is -17.0 %. The global longitudinal strain is normal.   2. Right ventricular systolic function is low normal. The right  ventricular size is mildly enlarged. Tricuspid regurgitation signal is  inadequate for assessing PA pressure.   3. The mitral valve is grossly normal. Trivial mitral valve  regurgitation. No evidence of mitral stenosis.   4. The aortic valve  is tricuspid. There is mild calcification of the  aortic valve. There is mild thickening of the aortic valve. Aortic valve  regurgitation is not visualized. Mild to moderate aortic valve  sclerosis/calcification is present, without any  evidence of aortic stenosis.   5.  The inferior vena cava is normal in size with greater than 50%  respiratory variability, suggesting right atrial pressure of 3 mmHg.   Comparison(s): No significant change from prior study. TEE 09/01/19  50-55%. TTE 07/11/19 EF 60-65%.   CARDIAC STUDIES: Ost RCA to Prox RCA lesion is 100% stenosed. Dist LM to Prox LAD lesion is 99% stenosed. Ost Cx lesion is 80% stenosed. Prox LAD to Mid LAD lesion is 100% stenosed. Mid LM lesion is 80% stenosed. Prox Cx lesion is 80% stenosed.   Severe  native coronary artery disease with 80% calcified distal left main stenosis; 99% ostial LAD stenosis with total occlusion of the LAD after the proximal diagonal and septal perforating artery; 80% ostial circumflex stenosis followed by 80% proximal stenosis with extensive collateralization to a dominant RCA from the circumflex vessel as well as faint collaterals to the distal LAD, and total proximal RCA occlusion.   LVEDP 14 mm   RECOMMENDATION:  Urgent Surgical consultation will be obtained for CABG revascularization.  A stat P2Y12 blood test was sent from the laboratory to assess the antiplatelet effect of his self prescribed 8 years outdated Plavix with his last dose 2 days ago.   Intervention  Assessment & Plan   1.  Coronary artery disease-denies recent arm neck back or chest discomfort.  Underwent CABG x4 by Dr. Kipp Brood 09/01/2019.  Continues to be physically active walking several times per day.  Denies increased DOE. Continue aspirin, ezetimibe, Praluent Heart healthy low-sodium diet-salty 6 given Increase physical activity as tolerated  Essential hypertension-BP today 130/80.  Has noticed increased blood pressures in the  mornings 315-400 systolic. Continue HCTZ, metoprolol, telmisartan Start amlodipine 2.5 mg at bedtime Heart healthy low-sodium diet-salty 6 given Increase physical activity as tolerated  Hyperlipidemia-LDL 29 on 07/10/2021 Continue rosuvastatin, ezetimibe, Praluent Heart healthy low-sodium high-fiber diet Increase physical activity as tolerated  Bilateral lower extremity edema-euvolemic today.  Occasionally using furosemide for right lower extremity swelling Continue furosemide as needed Elevate lower extremities when not active Lower extremity support stockings  Chronic low back pain-has had less low back pain after receiving steroid injections by Dr. Vertell Limber. Increase physical activity as tolerated Follows with orthopedics  Disposition: Follow-up with Dr. Claiborne Billings in 3-4 months.  Jossie Ng. Franziska Podgurski NP-C    09/12/2021, 2:33 PM [place] [address] Office [phone] Fax [phone]  Notice: This dictation was prepared with Dragon dictation along with smaller phrase technology. Any transcriptional errors that result from this process are unintentional and may not be corrected upon review.  I spent 15 minutes examining this patient, reviewing medications, and using patient centered shared decision making involving her cardiac care.  Prior to her visit I spent greater than 20 minutes reviewing her past medical history,  medications, and prior cardiac tests.

## 2021-09-12 ENCOUNTER — Other Ambulatory Visit: Payer: Self-pay

## 2021-09-12 ENCOUNTER — Encounter: Payer: Self-pay | Admitting: General Practice

## 2021-09-12 ENCOUNTER — Ambulatory Visit: Payer: Medicare HMO | Admitting: General Practice

## 2021-09-12 VITALS — BP 130/80 | HR 79 | Ht 68.5 in | Wt 217.0 lb

## 2021-09-12 DIAGNOSIS — M545 Low back pain, unspecified: Secondary | ICD-10-CM

## 2021-09-12 DIAGNOSIS — E782 Mixed hyperlipidemia: Secondary | ICD-10-CM

## 2021-09-12 DIAGNOSIS — R600 Localized edema: Secondary | ICD-10-CM

## 2021-09-12 DIAGNOSIS — E7849 Other hyperlipidemia: Secondary | ICD-10-CM

## 2021-09-12 DIAGNOSIS — I25110 Atherosclerotic heart disease of native coronary artery with unstable angina pectoris: Secondary | ICD-10-CM | POA: Diagnosis not present

## 2021-09-12 DIAGNOSIS — I10 Essential (primary) hypertension: Secondary | ICD-10-CM

## 2021-09-12 DIAGNOSIS — G8929 Other chronic pain: Secondary | ICD-10-CM | POA: Diagnosis not present

## 2021-09-12 MED ORDER — AMLODIPINE BESYLATE 2.5 MG PO TABS: 2.5000 mg | ORAL_TABLET | Freq: Every day | ORAL | 3 refills | Status: DC

## 2021-09-12 MED ORDER — EZETIMIBE 10 MG PO TABS: 10.0000 mg | ORAL_TABLET | Freq: Every day | ORAL | 3 refills | Status: DC

## 2021-09-12 NOTE — Patient Instructions (Signed)
Medication Instructions:  START AMLODIPINE 2.5MG  IN THE PM  *If you need a refill on your cardiac medications before your next appointment, please call your pharmacy*  Lab Work:   Testing/Procedures:  NONE    NONE   Special Instructions PLEASE READ AND FOLLOW SALTY 6-ATTACHED-1,800mg  daily  PLEASE MAINTAIN PHYSICAL ACTIVITY AS TOLERATED,   TAKE AND LOG YOU BLOOD PRESSURE   IF YOU ARE STILL HAVING SINUS ISSUES OR NAUSEA MAKE SURE TO FOLLOW UP WITH GI  Follow-Up: Your next appointment:  3-4 month(s) In Person with Coletta Memos, FNP  At [facility], you and your health needs are our priority.  As part of our continuing mission to provide you with exceptional heart care, we have created designated Provider Care Teams.  These Care Teams include your primary Cardiologist (physician) and Advanced Practice Providers (APPs -  Physician Assistants and Nurse Practitioners) who all work together to provide you with the care you need, when you need it.  We recommend signing up for the patient portal called "MyChart".  Sign up information is provided on this After Visit Summary.  MyChart is used to connect with patients for Virtual Visits (Telemedicine).  Patients are able to view lab/test results, encounter notes, upcoming appointments, etc.  Non-urgent messages can be sent to your provider as well.   To learn more about what you can do with MyChart, go to [email or web address].              6 SALTY THINGS TO AVOID     1,800MG  DAILY

## 2021-09-18 DIAGNOSIS — M9903 Segmental and somatic dysfunction of lumbar region: Secondary | ICD-10-CM | POA: Diagnosis not present

## 2021-09-18 DIAGNOSIS — M9901 Segmental and somatic dysfunction of cervical region: Secondary | ICD-10-CM | POA: Diagnosis not present

## 2021-09-18 DIAGNOSIS — M545 Low back pain, unspecified: Secondary | ICD-10-CM | POA: Diagnosis not present

## 2021-09-18 DIAGNOSIS — M9905 Segmental and somatic dysfunction of pelvic region: Secondary | ICD-10-CM | POA: Diagnosis not present

## 2021-09-18 IMAGING — DX DG CHEST 2V
2 series · 2 of 2 positions shown · non-contrast
Comparison: 09/04/2019, 09/03/2011

CLINICAL DATA: 73-year-old male with a history of CABG

EXAM:
CHEST - 2 VIEW

[dg chest 2 view (1 of 2)]
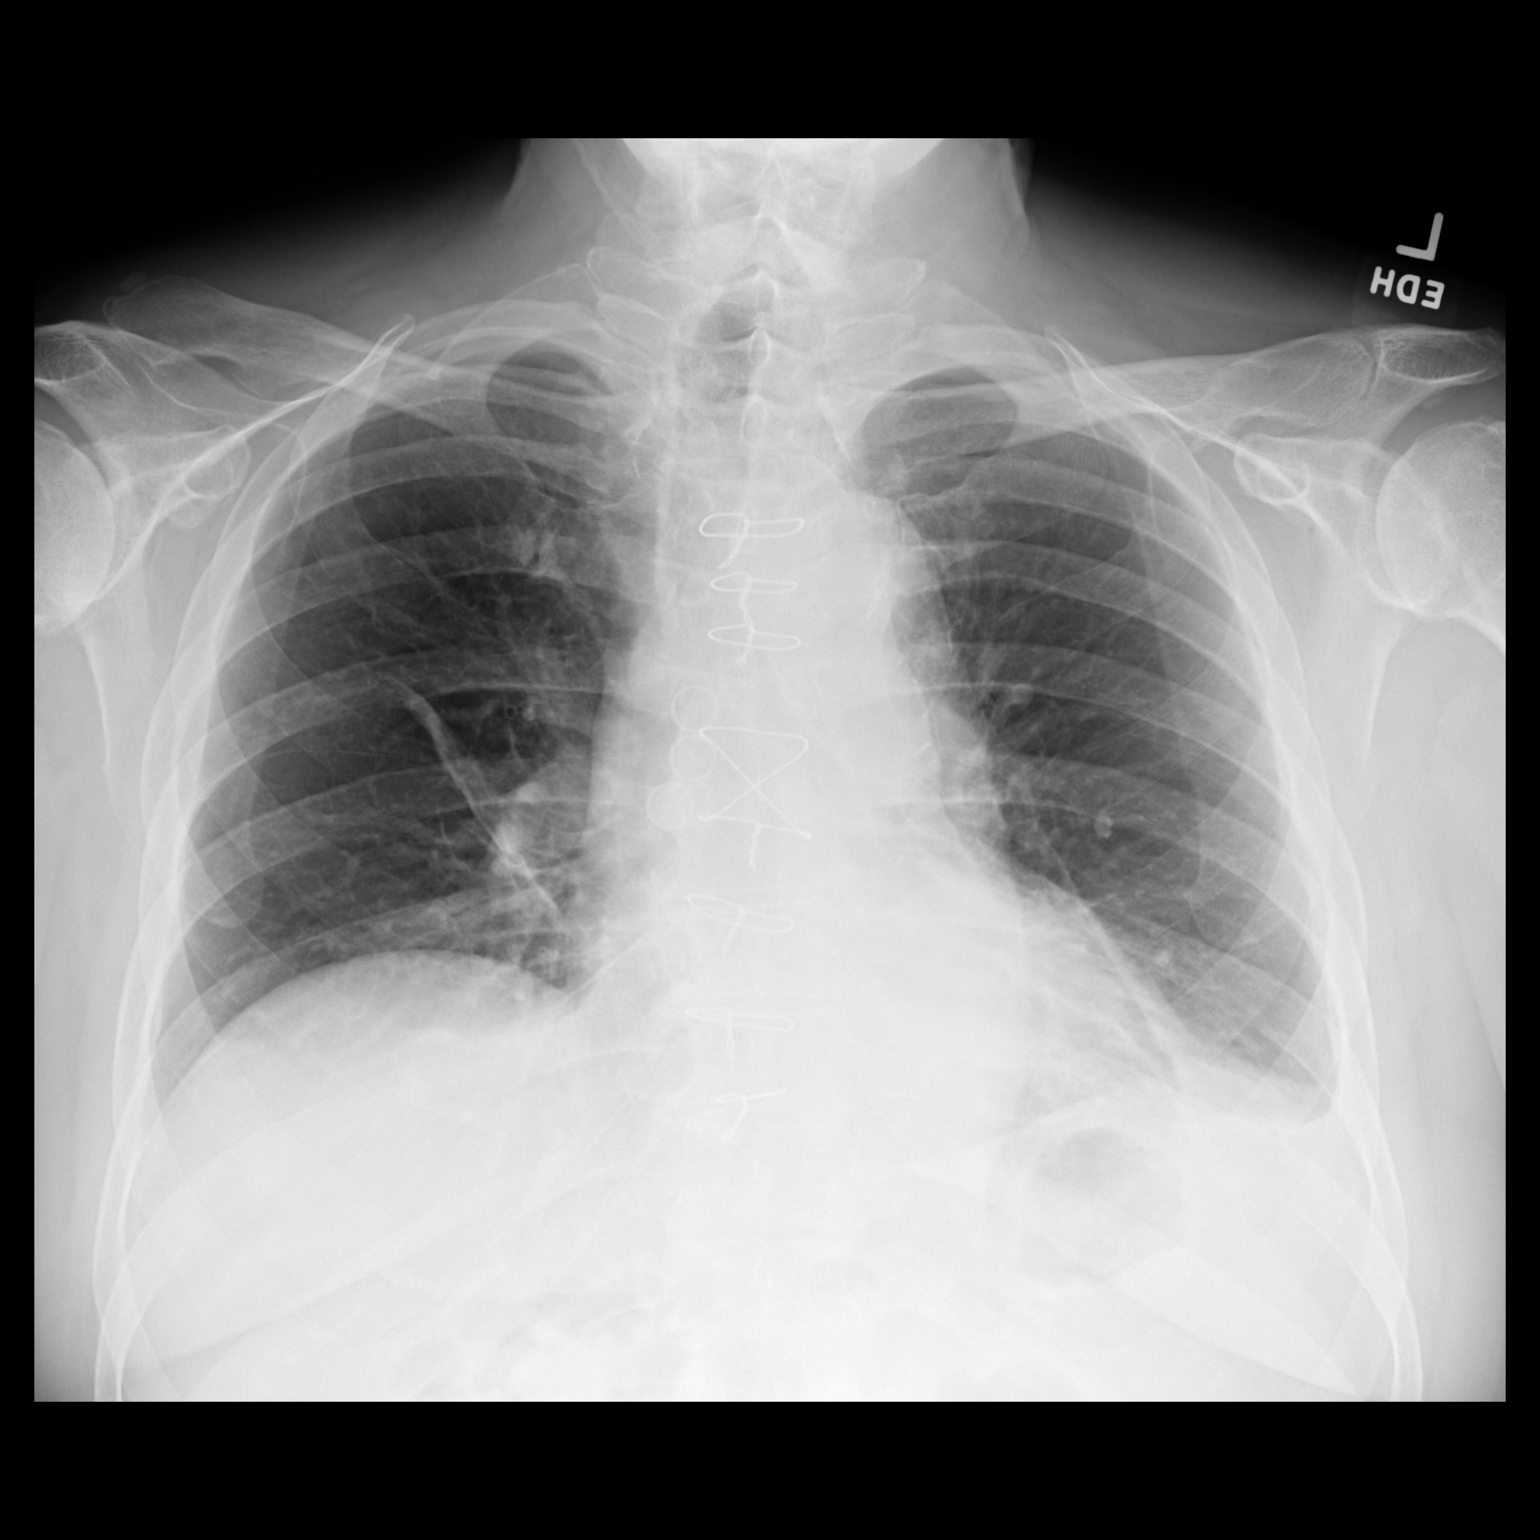

[dg chest 2 view (2 of 2)]
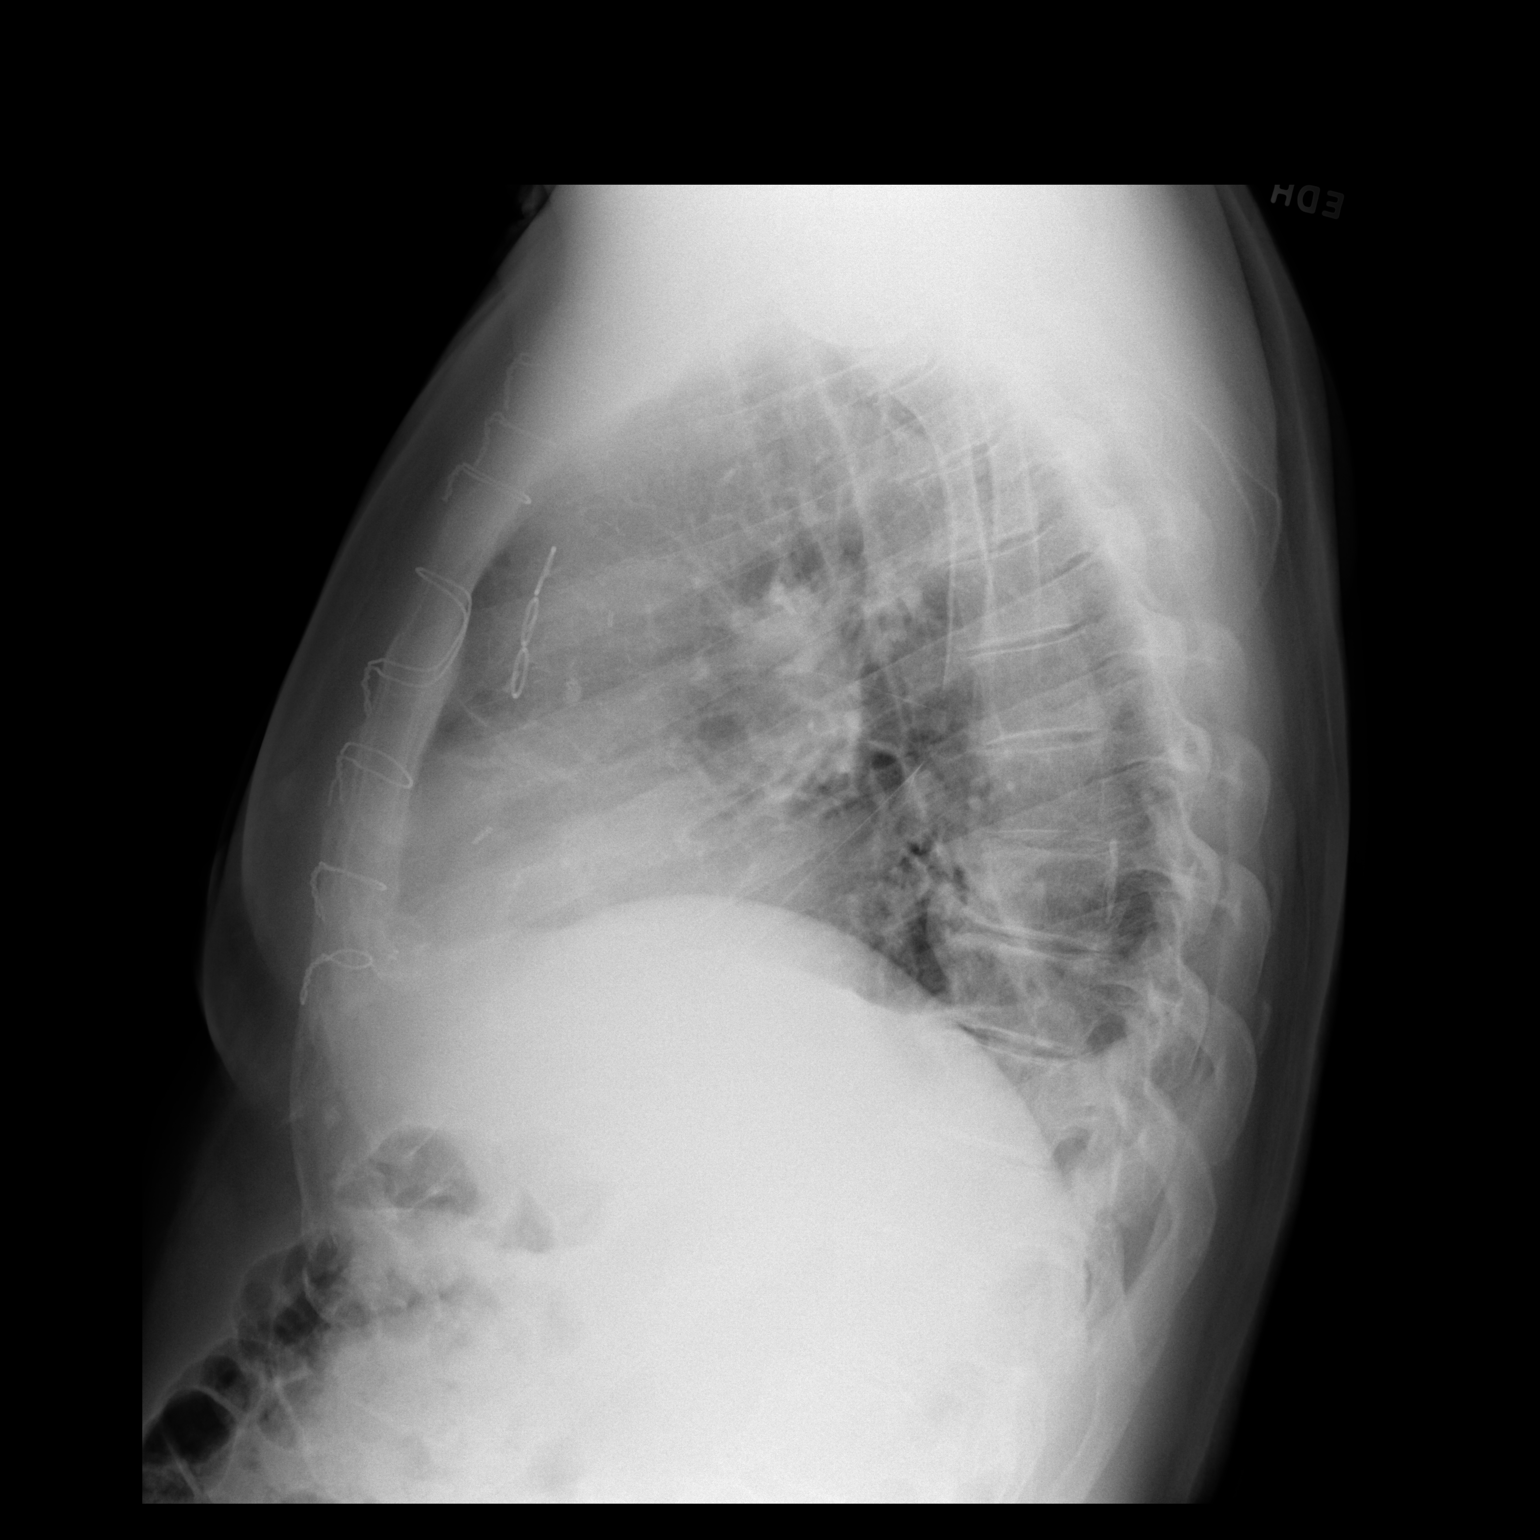

[2 of 2 positions shown; findings below may reference images not displayed]

FINDINGS: Cardiomediastinal silhouette unchanged in size and contour with
surgical changes of median sternotomy and CABG. No pneumothorax. Low
lung volumes with blunting of the bilateral costophrenic angles.
Linear opacities of the lungs. No confluent airspace disease.

No displaced fracture.
IMPRESSION: Low lung volumes with likely small pleural effusion/scarring.

Surgical changes of median sternotomy and CABG.

## 2021-09-23 ENCOUNTER — Other Ambulatory Visit: Payer: Self-pay | Admitting: Cardiovascular Disease

## 2021-10-06 ENCOUNTER — Other Ambulatory Visit: Payer: Self-pay | Admitting: Orthopedic Surgery

## 2021-10-06 ENCOUNTER — Ambulatory Visit
Admission: RE | Admit: 2021-10-06 | Discharge: 2021-10-06 | Disposition: A | Discharge status: Home / Self Care | Payer: Medicare HMO | Source: Ambulatory Visit | Attending: Orthopedic Surgery | Admitting: Orthopedic Surgery

## 2021-10-06 DIAGNOSIS — M1610 Unilateral primary osteoarthritis, unspecified hip: Secondary | ICD-10-CM

## 2021-10-06 DIAGNOSIS — M25552 Pain in left hip: Secondary | ICD-10-CM

## 2021-10-06 DIAGNOSIS — M1611 Unilateral primary osteoarthritis, right hip: Secondary | ICD-10-CM | POA: Diagnosis not present

## 2021-10-06 DIAGNOSIS — M25551 Pain in right hip: Secondary | ICD-10-CM | POA: Diagnosis not present

## 2021-10-09 DIAGNOSIS — M47816 Spondylosis without myelopathy or radiculopathy, lumbar region: Secondary | ICD-10-CM | POA: Diagnosis not present

## 2021-10-16 DIAGNOSIS — M9901 Segmental and somatic dysfunction of cervical region: Secondary | ICD-10-CM | POA: Diagnosis not present

## 2021-10-16 DIAGNOSIS — M9903 Segmental and somatic dysfunction of lumbar region: Secondary | ICD-10-CM | POA: Diagnosis not present

## 2021-10-16 DIAGNOSIS — M545 Low back pain, unspecified: Secondary | ICD-10-CM | POA: Diagnosis not present

## 2021-10-16 DIAGNOSIS — M9905 Segmental and somatic dysfunction of pelvic region: Secondary | ICD-10-CM | POA: Diagnosis not present

## 2021-10-26 ENCOUNTER — Other Ambulatory Visit: Payer: Self-pay | Admitting: Cardiovascular Disease

## 2021-11-06 ENCOUNTER — Telehealth: Payer: Self-pay | Admitting: Cardiovascular Disease

## 2021-11-06 NOTE — Telephone Encounter (Addendum)
° °  Primary Cardiologist: Shelva Majestic, MD  Patient Name: [name] [name]  DOB: 08-08-1946 MRN: [id number]   Patient has a history of CAD s/p CABG x 3 10/20, mild calcification of aortic valve without stenosis, essential hypertension, hyperlipidemia. His last echocardiogram on 10/10/20 revealed LVEF 55-60%, no regional wall abnormalities. He was last seen in our office by Coletta Memos, NP on 09/12/21.  Chart reviewed as part of pre-operative protocol coverage. Given past medical history and time since last visit, based on ACC/AHA guidelines, [name] would be at acceptable risk for the planned procedure without further cardiovascular testing. He achieves > 4 METS activity on a consistent basis and denies chest pain, dyspnea, edema, pre-syncope, syncope, or palpitations. His risk of MACE is low at Class II Risk, 0.9%.  Dr. Johney Maine has requested that patient hold aspirin for 6 days prior to surgery. This request is being submitted to Dr. Claiborne Billings for agreement.  Dr. Claiborne Billings, please route your response to CV DIV PREOP  Patient was advised that if he develops new symptoms prior to surgery to contact our office to arrange a follow-up appointment.  He verbalized understanding.  Emmaline Life, NP-C    11/06/2021, 2:33 PM [place] [address], [address] Office [phone] Fax [phone]

## 2021-11-06 NOTE — Telephone Encounter (Signed)
° °  Pre-operative Risk Assessment    Patient Name: [name] [name]  DOB: 1946/08/29 MRN: [id number]      Request for Surgical Clearance    Procedure:   RIGHT HIP RESURFACING  Date of Surgery:  Clearance 12/31/21                                 Surgeon:  DR. Lonzo Cloud Surgeon's Group or Practice Name:  [facility] Phone number:  [phone] Fax number:  [phone]   Type of Clearance Requested:   - Medical  - Pharmacy:  Hold Aspirin per CLEARANCE REQUEST TO HOLD ANY BLOOD THINNERS x 6 DAYS PRIOR   Type of Anesthesia:  Spinal WITH SEDATION   Additional requests/questions:    Jiles Prows   11/06/2021, 1:44 PM

## 2021-11-06 NOTE — Telephone Encounter (Signed)
I left a message for Crystal with Dr. Clyda Greener office in regard to clearance status. I left message that our office never received any clearance request. Our office will need a clearance request faxed to the office, with the needed information: Procedure, Anesthesia, Meds to be held if any, Date of surgery, fax #, name of Surgeon.  Once clearance request has been received I will be sure to present to our pre op provider for review.

## 2021-11-06 NOTE — Telephone Encounter (Signed)
New Message:    Please call Crystal, concerning this patient please

## 2021-11-06 NOTE — Telephone Encounter (Incomplete Revision)
° °  Primary Cardiologist: Shelva Majestic, MD  Patient Name: [name] [name]  DOB: 1946/01/23 MRN: [id number]   Patient has a history of CAD s/p CABG x 3 10/20, mild calcification of aortic valve without stenosis, essential hypertension, hyperlipidemia. His last echocardiogram on 10/10/20 revealed LVEF 55-60%, no regional wall abnormalities. He was last seen in our office by Coletta Memos, NP on 09/12/21.  Chart reviewed as part of pre-operative protocol coverage. Given past medical history and time since last visit, based on ACC/AHA guidelines, [name] would be at acceptable risk for the planned procedure without further cardiovascular testing. He achieves > 4 METS activity on a consistent basis and denies chest pain, dyspnea, edema, pre-syncope, syncope, or palpitations. His risk of MACE is low at Class II Risk, 0.9%.  Dr. Johney Maine has requested that patient hold aspirin for 6 days prior to surgery. This request is being submitted to Dr. Claiborne Billings for agreement.  Dr. Claiborne Billings, please route your response to CV DIV PREOP  Patient was advised that if he develops new symptoms prior to surgery to contact our office to arrange a follow-up appointment.  He verbalized understanding.  Emmaline Life, NP-C    11/06/2021, 2:33 PM [place] [address] Office [phone] Fax [phone]

## 2021-11-11 DIAGNOSIS — M47816 Spondylosis without myelopathy or radiculopathy, lumbar region: Secondary | ICD-10-CM | POA: Diagnosis not present

## 2021-11-12 DIAGNOSIS — J40 Bronchitis, not specified as acute or chronic: Secondary | ICD-10-CM | POA: Diagnosis not present

## 2021-11-13 ENCOUNTER — Other Ambulatory Visit: Payer: Self-pay

## 2021-11-13 ENCOUNTER — Ambulatory Visit
Admission: RE | Admit: 2021-11-13 | Discharge: 2021-11-13 | Disposition: A | Discharge status: Home / Self Care | Payer: Medicare HMO | Source: Ambulatory Visit | Attending: Orthopedic Surgery | Admitting: Orthopedic Surgery

## 2021-11-13 ENCOUNTER — Other Ambulatory Visit: Payer: Self-pay | Admitting: Orthopedic Surgery

## 2021-11-13 DIAGNOSIS — Z20822 Contact with and (suspected) exposure to covid-19: Secondary | ICD-10-CM | POA: Diagnosis not present

## 2021-11-13 DIAGNOSIS — Z01818 Encounter for other preprocedural examination: Secondary | ICD-10-CM

## 2021-11-13 DIAGNOSIS — J8410 Pulmonary fibrosis, unspecified: Secondary | ICD-10-CM | POA: Diagnosis not present

## 2021-11-13 DIAGNOSIS — Z951 Presence of aortocoronary bypass graft: Secondary | ICD-10-CM | POA: Diagnosis not present

## 2021-11-13 DIAGNOSIS — I700 Atherosclerosis of aorta: Secondary | ICD-10-CM | POA: Diagnosis not present

## 2021-11-13 NOTE — Telephone Encounter (Signed)
Okay to hold aspirin for 6 days prior to surgery

## 2021-11-14 NOTE — Telephone Encounter (Signed)
° °  Primary Cardiologist: Shelva Majestic, MD  Chart reviewed as part of pre-operative protocol coverage. Given past medical history and time since last visit, based on ACC/AHA guidelines, [name] [name] would be at acceptable risk for the planned procedure without further cardiovascular testing.   His RCRI is a class II risk, 0.9% risk of major cardiac event.  Is able to achieve greater than 4 METS of physical activity.  His aspirin may be held for 6 days prior to the surgery.  Please resume as soon as hemostasis is achieved.  Patient was advised that if he develops new symptoms prior to surgery to contact our office to arrange a follow-up appointment.  He verbalized understanding.  I will route this recommendation to the requesting party via Epic fax function and remove from pre-op pool.  Please call with questions.  Jossie Ng. Dmitri Pettigrew NP-C    11/14/2021, 7:44 AM [place] [address] Office [phone] Fax [phone]

## 2021-11-20 DIAGNOSIS — Z01812 Encounter for preprocedural laboratory examination: Secondary | ICD-10-CM | POA: Diagnosis not present

## 2021-11-20 DIAGNOSIS — Z0189 Encounter for other specified special examinations: Secondary | ICD-10-CM | POA: Diagnosis not present

## 2021-11-20 DIAGNOSIS — M9903 Segmental and somatic dysfunction of lumbar region: Secondary | ICD-10-CM | POA: Diagnosis not present

## 2021-11-20 DIAGNOSIS — M9905 Segmental and somatic dysfunction of pelvic region: Secondary | ICD-10-CM | POA: Diagnosis not present

## 2021-11-20 DIAGNOSIS — M9901 Segmental and somatic dysfunction of cervical region: Secondary | ICD-10-CM | POA: Diagnosis not present

## 2021-11-20 DIAGNOSIS — M545 Low back pain, unspecified: Secondary | ICD-10-CM | POA: Diagnosis not present

## 2021-12-02 ENCOUNTER — Other Ambulatory Visit: Payer: Self-pay

## 2021-12-02 MED ORDER — ROSUVASTATIN CALCIUM 40 MG PO TABS: 40.0000 mg | ORAL_TABLET | Freq: Every day | ORAL | 3 refills | Status: DC

## 2021-12-18 DIAGNOSIS — M9901 Segmental and somatic dysfunction of cervical region: Secondary | ICD-10-CM | POA: Diagnosis not present

## 2021-12-18 DIAGNOSIS — M545 Low back pain, unspecified: Secondary | ICD-10-CM | POA: Diagnosis not present

## 2021-12-18 DIAGNOSIS — M9903 Segmental and somatic dysfunction of lumbar region: Secondary | ICD-10-CM | POA: Diagnosis not present

## 2021-12-18 DIAGNOSIS — M9905 Segmental and somatic dysfunction of pelvic region: Secondary | ICD-10-CM | POA: Diagnosis not present

## 2021-12-18 NOTE — Progress Notes (Signed)
Cardiology Clinic Note   Patient Name: [name] [name] Date of Encounter: 12/22/2021  Primary Care Provider:  Hulan Fess, MD Primary Cardiologist:  Shelva Majestic, MD  Patient Profile    [name] [name] 76 year old male presents the clinic today for follow-up evaluation of his coronary artery disease.  Past Medical History    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   left post shoulder-sup ([id number]), left post leg    Coronary artery disease    Gout    Of the left third, fourth, and fifth MTPareas with swelling of left foot and pain   Hyperlipidemia    Hypertension    Past Surgical History:  Procedure Laterality Date   CARDIAC CATHETERIZATION  01/04/1986   CORONARY ARTERY BYPASS GRAFT N/A 09/01/2019   Procedure: CORONARY ARTERY BYPASS GRAFTING (CABG) x Four , using left internal mammary artery and right leg greater saphenous vein harvested endoscopically;  Surgeon: Lajuana Matte, MD;  Location: [facility];  Service: Open Heart Surgery;  Laterality: N/A;   LEFT HEART CATH AND CORONARY ANGIOGRAPHY N/A 08/31/2019   Procedure: LEFT HEART CATH AND CORONARY ANGIOGRAPHY;  Surgeon: Troy Sine, MD;  Location: [facility];  Service: Cardiovascular;  Laterality: N/A;   TEE WITHOUT CARDIOVERSION N/A 09/01/2019   Procedure: Transesophageal Echocardiogram (Tee);  Surgeon: Lajuana Matte, MD;  Location: [facility];  Service: Open Heart Surgery;  Laterality: N/A;    Allergies  No Known Allergies  History of Present Illness    [name] [name] coronary artery disease, unstable angina, essential hypertension, hyperlipidemia, morbid obesity, and status post CABG x4 on 09/01/19 (Dr. Kipp Brood).  Echocardiogram 10/10/2020 showed 55-60% LV function, mild calcification on the aortic valve with no stenosis.  He was seen by Dr. Claiborne Billings 09/29/2019.  During that time he did note  some residual swelling in his right lower extremity.  He had previously been on furosemide 40 mg daily however it had been discontinued.  He continued his Praluent, rosuvastatin, and ezetimibe.  He denied palpitations, presyncope and syncope.  Dr. Claiborne Billings reordered furosemide 20 mg for 5-7 days and then as needed.  He changed his metoprolol succinate to 25 mg daily.  He was seen again by Dr. Claiborne Billings in follow-up on 09/12/2020.  During that time he continued to be stable.  He reported significant benefit from furosemide and has been taking it 2 times per week.  He continued to experience mild right lower extremity swelling.  Post CABG he noted improved breathing and less dyspnea with exertion.  He reported that he had never experienced significant chest pressure.  He was continuing cardiac rehab at [facility].  His lipid panel was reviewed which showed an LDL of 19 on 01/01/2020.  He denied palpitations presyncope and syncope.  He presented to the clinic 09/12/21 for follow-up evaluation stated he had noticed some dry heaving/nausea in the mornings 3-4 times a week for the last year.  He had been working with his primary doctor and was  taking Prilosec for 14 days to see if acid reflux was the cause.  He previously thought that it may be related to sinus drainage and allergies.  However, he continued to take allergy medication and had done nasal rinses which did not helped with his symptoms.  He noticed elevated blood pressure in the morning in the 150 160 range.  We reviewed his  blood pressure medication.  I added amlodipine 2.5 mg in the evening and continued his other current medications.  I gave him the salty 6 diet sheet and had him increase his physical activity as tolerated.  He was fairly active helping 3-4 times a week 18 holes.  I planned follow-up in 3 to 4 months and asked him to maintain a blood pressure log.  He brought in his lab work today which we reviewed.  He presents to the clinic today  for follow-up evaluation and states he feels well.  He has been monitoring his blood pressure and brings in his log today.  It shows blood pressures averaging in the 130s-140s over 70s-80s.  He does have the blood pressure in the 100s over 70s.  He reports that with these lower blood pressures he did note some lightheadedness.  He continues to be somewhat physically active golfing.  He is planning to have a hip replaced in the future.  He was diagnosed with hip dysplasia from his neurosurgeon.  He reports that his PCP Dr. Rex Kras has retired and is in the process of finding a new PCP.  I will give him the information for the [facility], have him continue his diet, blood pressure log, and current medications.  We will plan follow-up in November.  Today he denies chest pain, shortness of breath, lower extremity edema, fatigue, palpitations, melena, hematuria, hemoptysis, diaphoresis, weakness, presyncope, syncope, orthopnea, and PND.   Home Medications    Prior to Admission medications   Medication Sig Start Date End Date Taking? Authorizing Provider  aspirin EC 81 MG tablet Take 81 mg by mouth daily.    [provider]  celecoxib (CELEBREX) 100 MG capsule Take 100 mg by mouth 2 (two) times daily.    [provider]  ezetimibe (ZETIA) 10 MG tablet TAKE 1 TABLET BY MOUTH  DAILY Patient taking differently: Take 10 mg by mouth every evening.  07/11/19   Troy Sine, MD  furosemide (LASIX) 20 MG tablet TAKE 1 TABLET BY MOUTH EVERY DAY 10/02/20   Troy Sine, MD  hydrochlorothiazide (MICROZIDE) 12.5 MG capsule Take 1 capsule (12.5 mg total) by mouth every other day. Every other day to every 3rd day depending on swelling and blood pressure. 01/27/21   Troy Sine, MD  ipratropium (ATROVENT) 0.03 % nasal spray Place into both nostrils 2 (two) times daily.  07/04/20   [provider]  loratadine (CLARITIN) 10 MG tablet TAKE 1 TABLET BY MOUTH EVERY DAY 03/15/20    Lendon Colonel, NP  metoprolol succinate (TOPROL-XL) 25 MG 24 hr tablet TAKE 1 TABLET BY MOUTH EVERY DAY 10/04/20   Troy Sine, MD  Oxymetazoline HCl (NASAL SPRAY) 0.05 % SOLN Place 1 spray into the nose daily as needed (congestion).    [provider]  PRALUENT 150 MG/ML SOAJ INJECT 150MG  INTO THE SKIN EVERY 14 DAYS 03/10/21   Troy Sine, MD  pregabalin (LYRICA) 75 MG capsule Take 75 mg by mouth 2 (two) times daily.    [provider]  rosuvastatin (CRESTOR) 40 MG tablet TAKE 1 TABLET BY MOUTH  DAILY 01/30/20   Troy Sine, MD  telmisartan (MICARDIS) 80 MG tablet Take 1 tablet (80 mg total) by mouth daily. SCHEDULE OFFICE VISIT FOR FUTURE REFILLS 08/25/21   Troy Sine, MD    Family History    Family History  Problem Relation Age of Onset   Heart attack Father  Hyperlipidemia Father    Heart disease Brother    Hyperlipidemia Brother    He indicated that his father is deceased. He indicated that his brother is alive. He indicated that his maternal grandmother is deceased. He indicated that his maternal grandfather is deceased.   Social History    Social History   Socioeconomic History   Marital status: Single    Spouse name: Not on file   Number of children: Not on file   Years of education: Not on file   Highest education level: Master's degree (e.g., MA, MS, MEng, MEd, MSW, MBA)  Occupational History   Not on file  Tobacco Use   Smoking status: Never   Smokeless tobacco: Never  Substance and Sexual Activity   Alcohol use: Yes    Alcohol/week: 5.0 standard drinks    Types: 5 drink(s) per week    Comment: Scothc or wine   Drug use: Not on file   Sexual activity: Not on file  Other Topics Concern   Not on file  Social History Narrative   Not on file   Social Determinants of Health   Financial Resource Strain: Not on file  Food Insecurity: Not on file  Transportation Needs: Not on file  Physical Activity: Not on file  Stress:  Not on file  Social Connections: Not on file  Intimate Partner Violence: Not on file     Review of Systems    General:  No chills, fever, night sweats or weight changes.  Cardiovascular:  No chest pain, dyspnea on exertion, edema, orthopnea, palpitations, paroxysmal nocturnal dyspnea. Dermatological: No rash, lesions/masses Respiratory: No cough, dyspnea Urologic: No hematuria, dysuria Abdominal:   No nausea, vomiting, diarrhea, bright red blood per rectum, melena, or hematemesis Neurologic:  No visual changes, wkns, changes in mental status. All other systems reviewed and are otherwise negative except as noted above.  Physical Exam    VS:  BP 132/80    Pulse 76    Ht 5\' 8"  (1.727 m)    Wt 218 lb 9.6 oz (99.2 kg)    SpO2 97%    BMI 33.24 kg/m  , BMI Body mass index is 33.24 kg/m. GEN: Well nourished, well developed, in no acute distress. HEENT: normal. Neck: Supple, no JVD, carotid bruits, or masses. Cardiac: RRR, no murmurs, rubs, or gallops. No clubbing, cyanosis, edema.  Radials/DP/PT 2+ and equal bilaterally.  Respiratory:  Respirations regular and unlabored, clear to auscultation bilaterally. GI: Soft, nontender, nondistended, BS + x 4. MS: no deformity or atrophy. Skin: warm and dry, no rash. Neuro:  Strength and sensation are intact. Psych: Normal affect.  Accessory Clinical Findings    Recent Labs: No results found for requested labs within last 8760 hours.   Recent Lipid Panel    Component Value Date/Time   CHOL 129 10/08/2020 1116   CHOL 153 12/20/2015 0949   TRIG 56 10/08/2020 1116   TRIG 45 12/20/2015 0949   TRIG 72 12/06/2014 0944   HDL 88 10/08/2020 1116   HDL 76 12/20/2015 0949   HDL 94 12/06/2014 0944   CHOLHDL 1.5 10/08/2020 1116   CHOLHDL 2.0 12/20/2015 0949   LDLCALC 29 10/08/2020 1116   LDLCALC 68 12/20/2015 0949    ECG personally reviewed by me today-  EKG 09/13/2019 sinus rhythm first-degree AV block 73 bpm-no acute  changes.  Echocardiogram 10/10/2020 IMPRESSIONS     1. Left ventricular ejection fraction, by estimation, is 55 to 60%. Left  ventricular ejection fraction by  3D volume is 56 %. The left ventricle has  normal function. The left ventricle has no regional wall motion  abnormalities. Left ventricular diastolic   function could not be evaluated. The average left ventricular global  longitudinal strain is -17.0 %. The global longitudinal strain is normal.   2. Right ventricular systolic function is low normal. The right  ventricular size is mildly enlarged. Tricuspid regurgitation signal is  inadequate for assessing PA pressure.   3. The mitral valve is grossly normal. Trivial mitral valve  regurgitation. No evidence of mitral stenosis.   4. The aortic valve is tricuspid. There is mild calcification of the  aortic valve. There is mild thickening of the aortic valve. Aortic valve  regurgitation is not visualized. Mild to moderate aortic valve  sclerosis/calcification is present, without any  evidence of aortic stenosis.   5. The inferior vena cava is normal in size with greater than 50%  respiratory variability, suggesting right atrial pressure of 3 mmHg.   Comparison(s): No significant change from prior study. TEE 09/01/19  50-55%. TTE 07/11/19 EF 60-65%.   CARDIAC STUDIES: Ost RCA to Prox RCA lesion is 100% stenosed. Dist LM to Prox LAD lesion is 99% stenosed. Ost Cx lesion is 80% stenosed. Prox LAD to Mid LAD lesion is 100% stenosed. Mid LM lesion is 80% stenosed. Prox Cx lesion is 80% stenosed.   Severe  native coronary artery disease with 80% calcified distal left main stenosis; 99% ostial LAD stenosis with total occlusion of the LAD after the proximal diagonal and septal perforating artery; 80% ostial circumflex stenosis followed by 80% proximal stenosis with extensive collateralization to a dominant RCA from the circumflex vessel as well as faint collaterals to the distal LAD, and  total proximal RCA occlusion.   LVEDP 14 mm   RECOMMENDATION:  Urgent Surgical consultation will be obtained for CABG revascularization.  A stat P2Y12 blood test was sent from the laboratory to assess the antiplatelet effect of his self prescribed 8 years outdated Plavix with his last dose 2 days ago.   Intervention  Assessment & Plan   1.  Essential hypertension-BP today 132/80.  Blood pressures are better controlled with addition of amlodipine at bedtime.  Blood pressures in the range 130- 140/70-80 range.  Does have the blood pressure in the 100s over 70s.  He does feel some lightheadedness with this.  We reviewed the importance of hydration. Continue HCTZ, metoprolol, telmisartan Continue amlodipine 2.5 mg at bedtime Heart healthy low-sodium diet-salty 6 given Increase physical activity as tolerated  Coronary artery disease-denies chest pain.    Underwent CABG x4 by Dr. Kipp Brood 09/01/2019.  Continues to be walks several times per day.  Feels that his breathing has returned to baseline. Continue aspirin, ezetimibe, Praluent Heart healthy low-sodium diet-salty 6 given Increase physical activity as tolerated  Bilateral lower extremity edema-euvolemic today.  Occasionally using furosemide for right lower extremity swelling Continue furosemide as needed Elevate lower extremities when not active Lower extremity support stockings  Hyperlipidemia-LDL 29 on 07/10/2021 Continue rosuvastatin, ezetimibe, Praluent Heart healthy low-sodium high-fiber diet Increase physical activity as tolerated Repeat fasting lipids and LFTs 9/23  Chronic low back pain-controlled/managed well with steroid injections. Increase physical activity as tolerated Follows with orthopedics/Dr. Vertell Limber  Disposition: Follow-up with Dr. Claiborne Billings in November.  Jossie Ng. Cable Fearn NP-C    12/22/2021, 11:51 AM [place] [address] Office [phone] Fax [phone]  Notice:  This dictation was prepared with Dragon dictation along  with smaller phrase technology. Any transcriptional errors that result from this process are unintentional and may not be corrected upon review.  I spent 14 minutes examining this patient, reviewing medications, and using patient centered shared decision making involving her cardiac care.  Prior to her visit I spent greater than 20 minutes reviewing her past medical history,  medications, and prior cardiac tests.

## 2021-12-22 ENCOUNTER — Ambulatory Visit: Payer: Medicare HMO | Admitting: General Practice

## 2021-12-22 ENCOUNTER — Encounter: Payer: Self-pay | Admitting: General Practice

## 2021-12-22 ENCOUNTER — Other Ambulatory Visit: Payer: Self-pay

## 2021-12-22 VITALS — BP 132/80 | HR 76 | Ht 68.0 in | Wt 218.6 lb

## 2021-12-22 DIAGNOSIS — R600 Localized edema: Secondary | ICD-10-CM

## 2021-12-22 DIAGNOSIS — I10 Essential (primary) hypertension: Secondary | ICD-10-CM

## 2021-12-22 DIAGNOSIS — G8929 Other chronic pain: Secondary | ICD-10-CM

## 2021-12-22 DIAGNOSIS — I25110 Atherosclerotic heart disease of native coronary artery with unstable angina pectoris: Secondary | ICD-10-CM

## 2021-12-22 DIAGNOSIS — M545 Low back pain, unspecified: Secondary | ICD-10-CM

## 2021-12-22 DIAGNOSIS — E782 Mixed hyperlipidemia: Secondary | ICD-10-CM

## 2021-12-22 NOTE — Patient Instructions (Signed)
Medication Instructions:  The current medical regimen is effective;  continue present plan and medications as directed. Please refer to the Current Medication list given to you today.   *If you need a refill on your cardiac medications before your next appointment, please call your pharmacy*  Lab Work:   Testing/Procedures:  NONE    NONE  Special Instructions PLEASE READ AND FOLLOW SALTY 6-ATTACHED-1,800mg  daily  PLEASE CONTINUE PHYSICAL ACTIVITY AS [place] [facility]:  Address: [address]nd floor, [place], [place] [address]; Phone: [phone] ATTACHED  Follow-Up: Your next appointment:  NOVEMBER 2023 In Person with Shelva Majestic, MD    Please call our office 2 months in advance to schedule this appointment  :1  At [facility], you and your health needs are our priority.  As part of our continuing mission to provide you with exceptional heart care, we have created designated Provider Care Teams.  These Care Teams include your primary Cardiologist (physician) and Advanced Practice Providers (APPs -  Physician Assistants and Nurse Practitioners) who all work together to provide you with the care you need, when you need it.            6 SALTY THINGS TO AVOID     1,800MG  DAILY

## 2021-12-30 DIAGNOSIS — M1611 Unilateral primary osteoarthritis, right hip: Secondary | ICD-10-CM | POA: Diagnosis not present

## 2021-12-31 DIAGNOSIS — K219 Gastro-esophageal reflux disease without esophagitis: Secondary | ICD-10-CM | POA: Diagnosis not present

## 2021-12-31 DIAGNOSIS — Z96641 Presence of right artificial hip joint: Secondary | ICD-10-CM | POA: Diagnosis not present

## 2021-12-31 DIAGNOSIS — Z951 Presence of aortocoronary bypass graft: Secondary | ICD-10-CM | POA: Diagnosis not present

## 2021-12-31 DIAGNOSIS — I2510 Atherosclerotic heart disease of native coronary artery without angina pectoris: Secondary | ICD-10-CM | POA: Diagnosis not present

## 2021-12-31 DIAGNOSIS — S76012A Strain of muscle, fascia and tendon of left hip, initial encounter: Secondary | ICD-10-CM | POA: Diagnosis not present

## 2021-12-31 DIAGNOSIS — I10 Essential (primary) hypertension: Secondary | ICD-10-CM | POA: Diagnosis not present

## 2021-12-31 DIAGNOSIS — E785 Hyperlipidemia, unspecified: Secondary | ICD-10-CM | POA: Diagnosis not present

## 2021-12-31 DIAGNOSIS — Y929 Unspecified place or not applicable: Secondary | ICD-10-CM | POA: Diagnosis not present

## 2021-12-31 DIAGNOSIS — M109 Gout, unspecified: Secondary | ICD-10-CM | POA: Diagnosis not present

## 2021-12-31 DIAGNOSIS — M1611 Unilateral primary osteoarthritis, right hip: Secondary | ICD-10-CM | POA: Diagnosis not present

## 2021-12-31 DIAGNOSIS — Z471 Aftercare following joint replacement surgery: Secondary | ICD-10-CM | POA: Diagnosis not present

## 2021-12-31 DIAGNOSIS — X58XXXA Exposure to other specified factors, initial encounter: Secondary | ICD-10-CM | POA: Diagnosis not present

## 2022-01-01 DIAGNOSIS — Z9889 Other specified postprocedural states: Secondary | ICD-10-CM | POA: Diagnosis not present

## 2022-01-01 DIAGNOSIS — X58XXXA Exposure to other specified factors, initial encounter: Secondary | ICD-10-CM | POA: Diagnosis not present

## 2022-01-01 DIAGNOSIS — M1611 Unilateral primary osteoarthritis, right hip: Secondary | ICD-10-CM | POA: Diagnosis not present

## 2022-01-01 DIAGNOSIS — I10 Essential (primary) hypertension: Secondary | ICD-10-CM | POA: Diagnosis not present

## 2022-01-01 DIAGNOSIS — S76012A Strain of muscle, fascia and tendon of left hip, initial encounter: Secondary | ICD-10-CM | POA: Diagnosis not present

## 2022-01-01 DIAGNOSIS — M109 Gout, unspecified: Secondary | ICD-10-CM | POA: Diagnosis not present

## 2022-01-01 DIAGNOSIS — E785 Hyperlipidemia, unspecified: Secondary | ICD-10-CM | POA: Diagnosis not present

## 2022-01-01 DIAGNOSIS — Y929 Unspecified place or not applicable: Secondary | ICD-10-CM | POA: Diagnosis not present

## 2022-01-01 DIAGNOSIS — I2510 Atherosclerotic heart disease of native coronary artery without angina pectoris: Secondary | ICD-10-CM | POA: Diagnosis not present

## 2022-01-01 DIAGNOSIS — K219 Gastro-esophageal reflux disease without esophagitis: Secondary | ICD-10-CM | POA: Diagnosis not present

## 2022-01-01 DIAGNOSIS — Z951 Presence of aortocoronary bypass graft: Secondary | ICD-10-CM | POA: Diagnosis not present

## 2022-01-03 DIAGNOSIS — W19XXXA Unspecified fall, initial encounter: Secondary | ICD-10-CM | POA: Diagnosis not present

## 2022-01-03 DIAGNOSIS — R55 Syncope and collapse: Secondary | ICD-10-CM | POA: Diagnosis not present

## 2022-01-05 DIAGNOSIS — M1611 Unilateral primary osteoarthritis, right hip: Secondary | ICD-10-CM | POA: Diagnosis not present

## 2022-01-07 DIAGNOSIS — M1611 Unilateral primary osteoarthritis, right hip: Secondary | ICD-10-CM | POA: Diagnosis not present

## 2022-01-13 DIAGNOSIS — J029 Acute pharyngitis, unspecified: Secondary | ICD-10-CM | POA: Diagnosis not present

## 2022-01-13 DIAGNOSIS — N401 Enlarged prostate with lower urinary tract symptoms: Secondary | ICD-10-CM | POA: Diagnosis not present

## 2022-01-13 DIAGNOSIS — R69 Illness, unspecified: Secondary | ICD-10-CM | POA: Diagnosis not present

## 2022-01-13 DIAGNOSIS — I10 Essential (primary) hypertension: Secondary | ICD-10-CM | POA: Diagnosis not present

## 2022-01-13 DIAGNOSIS — N529 Male erectile dysfunction, unspecified: Secondary | ICD-10-CM | POA: Diagnosis not present

## 2022-01-13 DIAGNOSIS — Z20822 Contact with and (suspected) exposure to covid-19: Secondary | ICD-10-CM | POA: Diagnosis not present

## 2022-01-13 DIAGNOSIS — E785 Hyperlipidemia, unspecified: Secondary | ICD-10-CM | POA: Diagnosis not present

## 2022-01-13 DIAGNOSIS — E291 Testicular hypofunction: Secondary | ICD-10-CM | POA: Diagnosis not present

## 2022-01-13 DIAGNOSIS — R059 Cough, unspecified: Secondary | ICD-10-CM | POA: Diagnosis not present

## 2022-02-10 DIAGNOSIS — M25551 Pain in right hip: Secondary | ICD-10-CM | POA: Diagnosis not present

## 2022-02-10 DIAGNOSIS — M1611 Unilateral primary osteoarthritis, right hip: Secondary | ICD-10-CM | POA: Diagnosis not present

## 2022-02-12 DIAGNOSIS — M9901 Segmental and somatic dysfunction of cervical region: Secondary | ICD-10-CM | POA: Diagnosis not present

## 2022-02-12 DIAGNOSIS — M545 Low back pain, unspecified: Secondary | ICD-10-CM | POA: Diagnosis not present

## 2022-02-12 DIAGNOSIS — M9903 Segmental and somatic dysfunction of lumbar region: Secondary | ICD-10-CM | POA: Diagnosis not present

## 2022-02-12 DIAGNOSIS — M9905 Segmental and somatic dysfunction of pelvic region: Secondary | ICD-10-CM | POA: Diagnosis not present

## 2022-02-26 ENCOUNTER — Other Ambulatory Visit: Payer: Self-pay | Admitting: Cardiovascular Disease

## 2022-03-09 ENCOUNTER — Other Ambulatory Visit: Payer: Self-pay | Admitting: Cardiovascular Disease

## 2022-03-10 ENCOUNTER — Encounter: Payer: Self-pay | Admitting: Dermatology

## 2022-03-10 ENCOUNTER — Ambulatory Visit: Payer: Medicare HMO | Admitting: Dermatology

## 2022-03-10 DIAGNOSIS — L821 Other seborrheic keratosis: Secondary | ICD-10-CM

## 2022-03-10 DIAGNOSIS — Z1283 Encounter for screening for malignant neoplasm of skin: Secondary | ICD-10-CM | POA: Diagnosis not present

## 2022-03-10 DIAGNOSIS — L565 Disseminated superficial actinic porokeratosis (DSAP): Secondary | ICD-10-CM | POA: Diagnosis not present

## 2022-03-10 DIAGNOSIS — Z85828 Personal history of other malignant neoplasm of skin: Secondary | ICD-10-CM | POA: Diagnosis not present

## 2022-03-10 MED ORDER — NONFORMULARY OR COMPOUNDED ITEM: 2 refills | Status: DC

## 2022-03-19 DIAGNOSIS — M9901 Segmental and somatic dysfunction of cervical region: Secondary | ICD-10-CM | POA: Diagnosis not present

## 2022-03-19 DIAGNOSIS — M545 Low back pain, unspecified: Secondary | ICD-10-CM | POA: Diagnosis not present

## 2022-03-19 DIAGNOSIS — M9903 Segmental and somatic dysfunction of lumbar region: Secondary | ICD-10-CM | POA: Diagnosis not present

## 2022-03-19 DIAGNOSIS — M9905 Segmental and somatic dysfunction of pelvic region: Secondary | ICD-10-CM | POA: Diagnosis not present

## 2022-03-28 ENCOUNTER — Encounter: Payer: Self-pay | Admitting: Dermatology

## 2022-03-28 NOTE — Progress Notes (Signed)
   Follow-Up Visit   Subjective  [name] [name] is a 76 y.o. male who presents for the following: Annual Exam (Patient here today for skin check no new concerns. Patient would like to know if there's any new treatment for his DSAP. Personal history of non mole skin cancer. No family history of atypical moles, melanoma or non mole skin cancer. ).  Annual skin check, discuss any new treatment for his extensive DSAP Location:  Duration:  Quality:  Associated Signs/Symptoms: Modifying Factors:  Severity:  Timing: Context:   Objective  Well appearing patient in no apparent distress; mood and affect are within normal limits. General skin examination: No atypical nevi or signs of NMSC noted at the time of the visit.   Torso - Posterior (Back) Multiple sharply defined 1 cm crusts over torso and extremities, pervious treatment [place]   Right Lower Back Dark raised lesion    A full examination was performed including scalp, head, eyes, ears, nose, lips, neck, chest, axillae, abdomen, back, buttocks, bilateral upper extremities, bilateral lower extremities, hands, feet, fingers, toes, fingernails, and toenails. All findings within normal limits unless otherwise noted below.   Assessment & Plan    Screening exam for skin cancer  Annual skin examination, encouraged to self examine twice annually  DSAP (disseminated superficial actinic porokeratosis) Torso - Posterior (Back)  Discussed recent articles using a compounded lovastatin cholesterol cream.  We checked with local pharmacies and this can be compounded for roughly $66 and patient is interested in trying this.  In the articles this was used twice daily for a period of 12 weeks.  NONFORMULARY OR COMPOUNDED ITEM - Torso - Posterior (Back) TOPICAL 2% CHOLESTEROL 2% LOVASTATIN CREAM  APPLY TO AFFECTED AREA BID  Seborrheic keratosis Right Lower Back  Stable and safe to leave       I, Lavonna Monarch, MD, have reviewed all  documentation for this visit.  The documentation on 03/28/22 for the exam, diagnosis, procedures, and orders are all accurate and complete.

## 2022-03-30 DIAGNOSIS — J018 Other acute sinusitis: Secondary | ICD-10-CM | POA: Diagnosis not present

## 2022-04-03 ENCOUNTER — Other Ambulatory Visit: Payer: Self-pay | Admitting: Cardiovascular Disease

## 2022-04-16 DIAGNOSIS — M9901 Segmental and somatic dysfunction of cervical region: Secondary | ICD-10-CM | POA: Diagnosis not present

## 2022-04-16 DIAGNOSIS — M9903 Segmental and somatic dysfunction of lumbar region: Secondary | ICD-10-CM | POA: Diagnosis not present

## 2022-04-16 DIAGNOSIS — M9905 Segmental and somatic dysfunction of pelvic region: Secondary | ICD-10-CM | POA: Diagnosis not present

## 2022-04-16 DIAGNOSIS — M545 Low back pain, unspecified: Secondary | ICD-10-CM | POA: Diagnosis not present

## 2022-05-19 DIAGNOSIS — M1611 Unilateral primary osteoarthritis, right hip: Secondary | ICD-10-CM | POA: Diagnosis not present

## 2022-05-21 DIAGNOSIS — M9905 Segmental and somatic dysfunction of pelvic region: Secondary | ICD-10-CM | POA: Diagnosis not present

## 2022-05-21 DIAGNOSIS — M9901 Segmental and somatic dysfunction of cervical region: Secondary | ICD-10-CM | POA: Diagnosis not present

## 2022-05-21 DIAGNOSIS — M9903 Segmental and somatic dysfunction of lumbar region: Secondary | ICD-10-CM | POA: Diagnosis not present

## 2022-05-21 DIAGNOSIS — M545 Low back pain, unspecified: Secondary | ICD-10-CM | POA: Diagnosis not present

## 2022-06-18 DIAGNOSIS — M545 Low back pain, unspecified: Secondary | ICD-10-CM | POA: Diagnosis not present

## 2022-06-18 DIAGNOSIS — M9903 Segmental and somatic dysfunction of lumbar region: Secondary | ICD-10-CM | POA: Diagnosis not present

## 2022-06-18 DIAGNOSIS — M9901 Segmental and somatic dysfunction of cervical region: Secondary | ICD-10-CM | POA: Diagnosis not present

## 2022-06-18 DIAGNOSIS — M9905 Segmental and somatic dysfunction of pelvic region: Secondary | ICD-10-CM | POA: Diagnosis not present

## 2022-07-07 ENCOUNTER — Other Ambulatory Visit: Payer: Self-pay | Admitting: Cardiovascular Disease

## 2022-07-09 DIAGNOSIS — U071 COVID-19: Secondary | ICD-10-CM | POA: Diagnosis not present

## 2022-07-14 DIAGNOSIS — Z0000 Encounter for general adult medical examination without abnormal findings: Secondary | ICD-10-CM | POA: Diagnosis not present

## 2022-07-14 DIAGNOSIS — Z1389 Encounter for screening for other disorder: Secondary | ICD-10-CM | POA: Diagnosis not present

## 2022-07-28 DIAGNOSIS — E785 Hyperlipidemia, unspecified: Secondary | ICD-10-CM | POA: Diagnosis not present

## 2022-07-28 DIAGNOSIS — Z125 Encounter for screening for malignant neoplasm of prostate: Secondary | ICD-10-CM | POA: Diagnosis not present

## 2022-07-28 DIAGNOSIS — I2510 Atherosclerotic heart disease of native coronary artery without angina pectoris: Secondary | ICD-10-CM | POA: Diagnosis not present

## 2022-07-28 DIAGNOSIS — K219 Gastro-esophageal reflux disease without esophagitis: Secondary | ICD-10-CM | POA: Diagnosis not present

## 2022-07-28 DIAGNOSIS — Z0000 Encounter for general adult medical examination without abnormal findings: Secondary | ICD-10-CM | POA: Diagnosis not present

## 2022-07-28 DIAGNOSIS — N182 Chronic kidney disease, stage 2 (mild): Secondary | ICD-10-CM | POA: Diagnosis not present

## 2022-07-28 DIAGNOSIS — I10 Essential (primary) hypertension: Secondary | ICD-10-CM | POA: Diagnosis not present

## 2022-07-28 DIAGNOSIS — N401 Enlarged prostate with lower urinary tract symptoms: Secondary | ICD-10-CM | POA: Diagnosis not present

## 2022-07-28 DIAGNOSIS — N529 Male erectile dysfunction, unspecified: Secondary | ICD-10-CM | POA: Diagnosis not present

## 2022-07-30 DIAGNOSIS — M9905 Segmental and somatic dysfunction of pelvic region: Secondary | ICD-10-CM | POA: Diagnosis not present

## 2022-07-30 DIAGNOSIS — M9901 Segmental and somatic dysfunction of cervical region: Secondary | ICD-10-CM | POA: Diagnosis not present

## 2022-07-30 DIAGNOSIS — M545 Low back pain, unspecified: Secondary | ICD-10-CM | POA: Diagnosis not present

## 2022-07-30 DIAGNOSIS — M9903 Segmental and somatic dysfunction of lumbar region: Secondary | ICD-10-CM | POA: Diagnosis not present

## 2022-08-11 IMAGING — MR MR LUMBAR SPINE W/O CM
4 of 5 series · 23 of 48 positions shown · non-contrast
Comparison: None.

CLINICAL DATA: 74-year-old male with low back pain radiating down
both legs. No prior surgery.

EXAM:
MRI LUMBAR SPINE WITHOUT CONTRAST
TECHNIQUE: Multiplanar, multisequence MR imaging of the lumbar spine was
performed. No intravenous contrast was administered.

[Series 2: T2 post-contrast · sagittal · 4.0mm · 0.53mm/px · 6 of 16 slices shown]
[im 1/16]
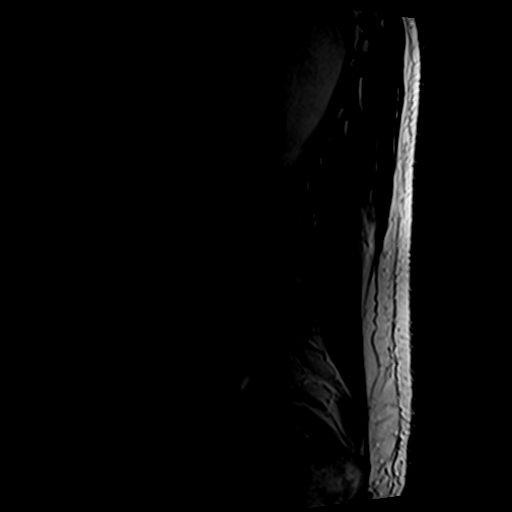
[im 4/16]
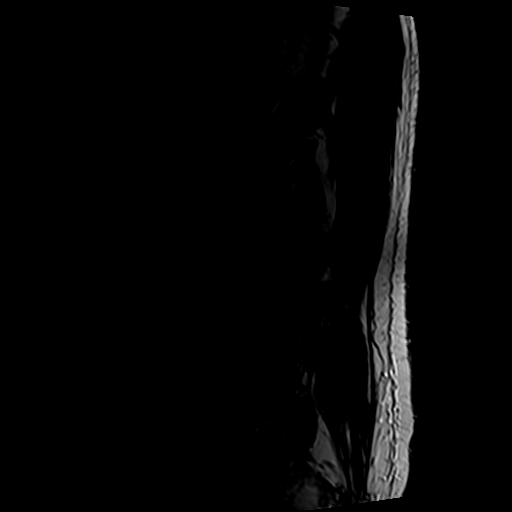
[im 7/16]
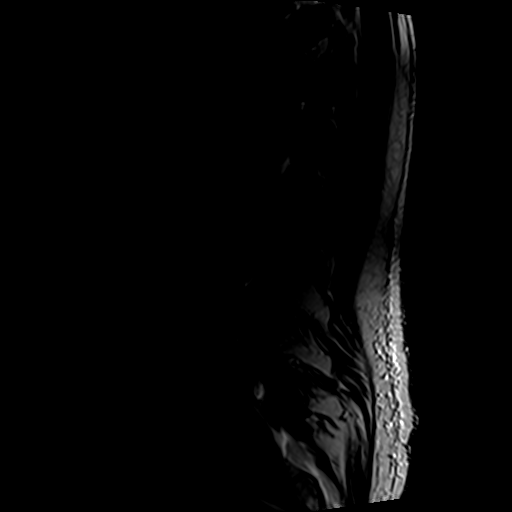
[im 10/16]
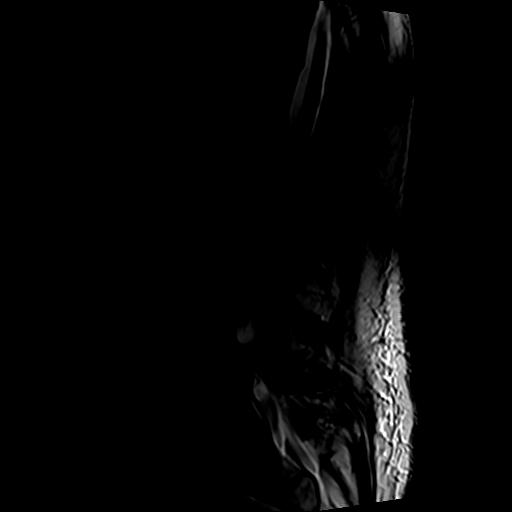
[im 13/16]
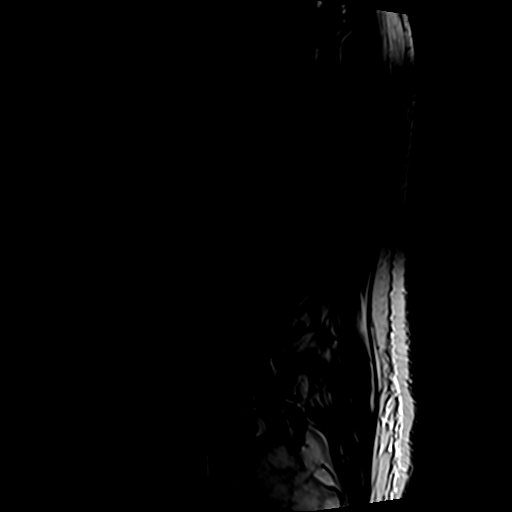
[im 16/16]
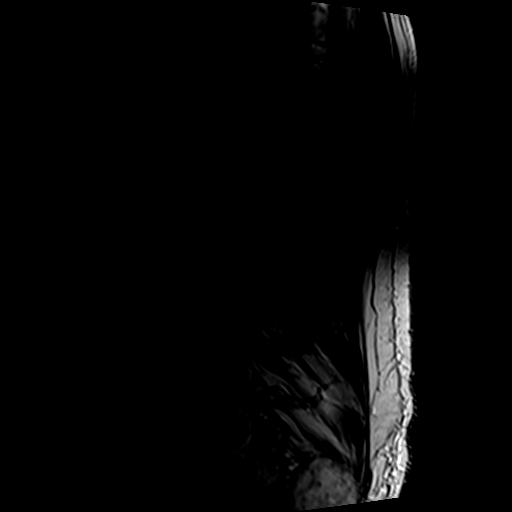

[Series 4: T1 · sagittal · 4.0mm · 0.53mm/px · 6 of 16 slices shown (1 of 2)]
[im 1/16]
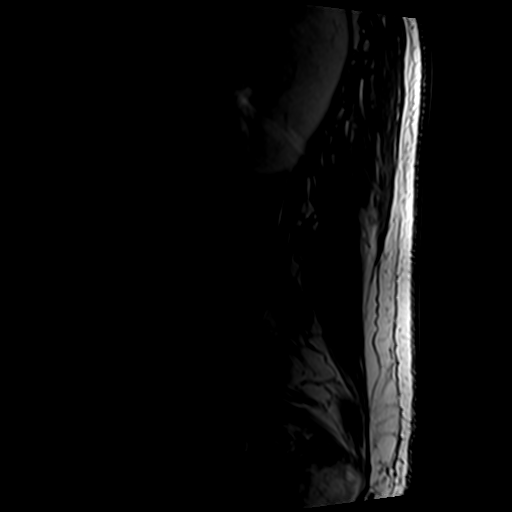
[im 3/16]
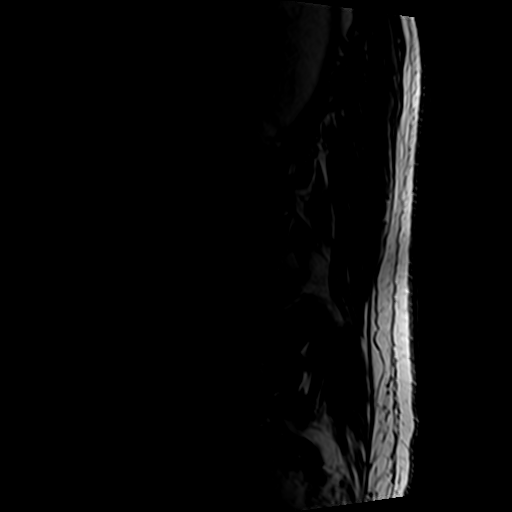
[im 6/16]
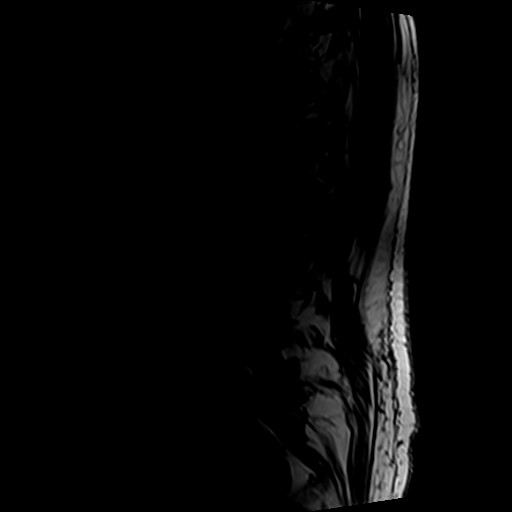
[im 8/16]
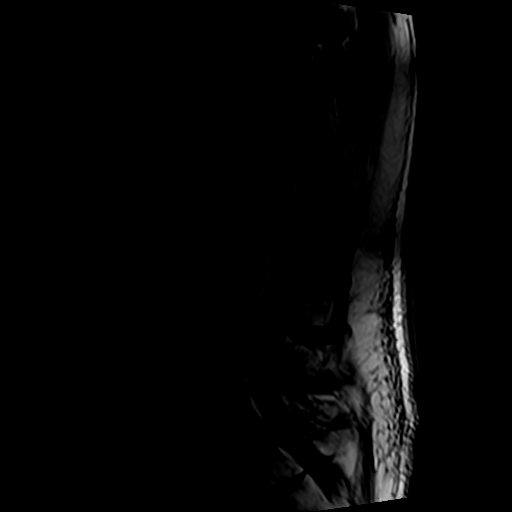
[im 11/16]
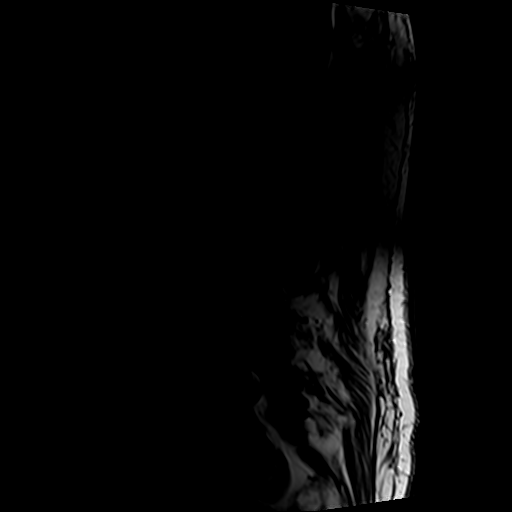
[im 13/16]
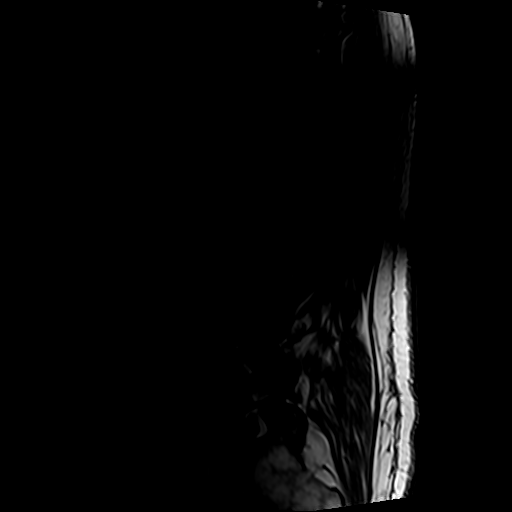

[Series 5: T2 · axial · 4.0mm · 0.70mm/px · z∈[-133,+79]mm · 8 of 35 slices shown]
[im 1/35]
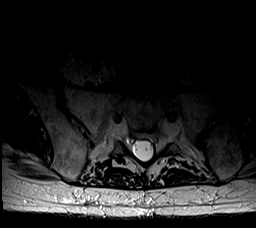
[im 6/35]
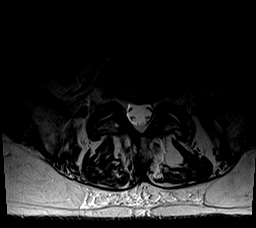
[im 11/35]
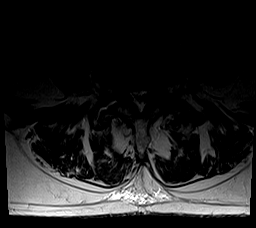
[im 16/35]
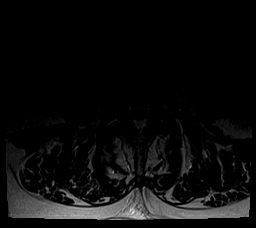
[im 19/35]
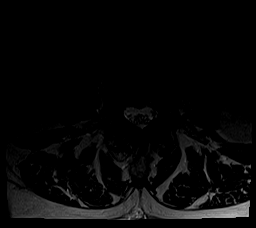
[im 24/35]
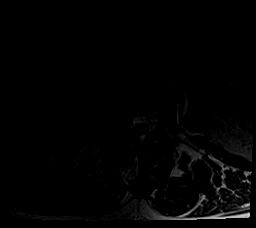
[im 29/35]
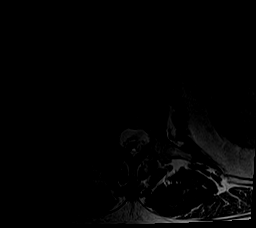
[im 35/35]
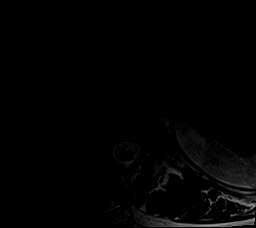

[Series 6: T1 · axial · 4.0mm · 0.35mm/px · z∈[-102,+43]mm · 3 of 35 slices shown (2 of 2)]
[im 6/35]
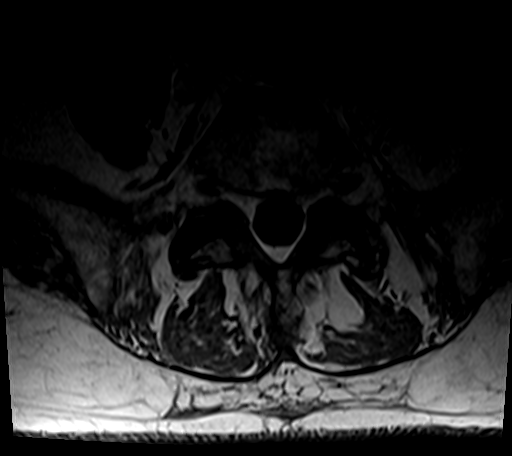
[im 19/35]
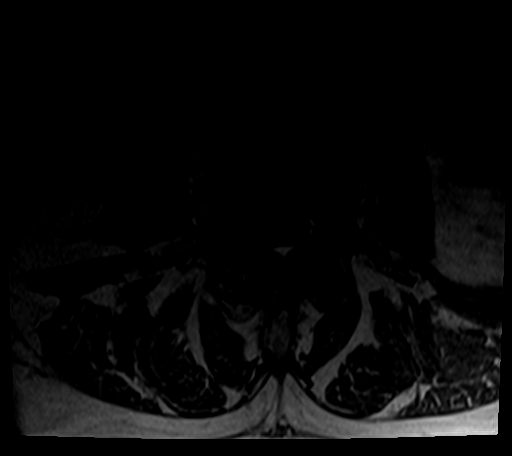
[im 29/35]
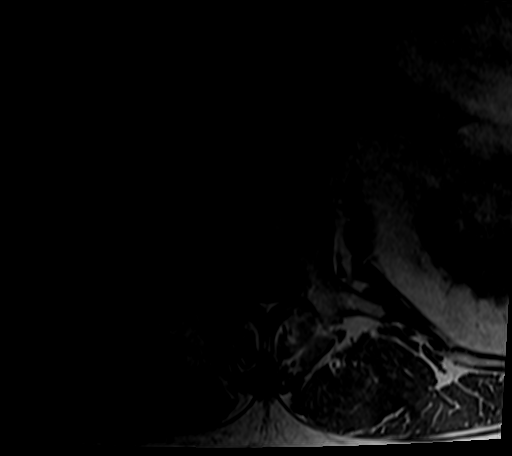

[23 of 48 positions shown; findings below may reference images not displayed]

FINDINGS: Segmentation: Lumbar segmentation appears to be normal and will be
designated as such for this report.

Alignment: Grade 1 anterolisthesis of L4 on L5 measuring 5 mm.
Superimposed similar retrolisthesis of L1 on L2. Underlying mild
lumbar scoliosis.

Vertebrae: Confluent degenerative endplate marrow edema at L1-L2
eccentric to the right (series 3, image 7). Normal background bone
marrow signal. No other marrow edema or acute osseous abnormality.
Intact visible sacrum and SI joints.

Conus medullaris and cauda equina: Conus extends to the L1 level. No
lower spinal cord or conus signal abnormality.

Paraspinal and other soft tissues: Negative.

Disc levels:

Visible lower thoracic levels through T12-L1 are negative aside from
degenerative facet hypertrophy.

L1-L2: Retrolisthesis with severe disc space loss. Right eccentric
circumferential disc osteophyte complex. Moderate facet hypertrophy
with degenerative facet joint fluid. Mild spinal stenosis. Moderate
right lateral recess stenosis (right L2 nerve level) and severe
right L1 foraminal stenosis. Mild left foraminal stenosis.

L2-L3: Circumferential but mostly far lateral disc bulging. Moderate
facet and ligament flavum hypertrophy greater on the right. Mild to
moderate spinal stenosis. Mild bilateral lateral recess stenosis (L3
nerve levels). Moderate right L2 foraminal stenosis.

L3-L4: Disc space loss with circumferential disc bulge. Moderate
facet and ligament flavum hypertrophy. Degenerative facet joint
fluid. Broad-based posterior component of disc. Moderate to severe
spinal stenosis. Mild to moderate lateral recess stenosis (L4 nerve
levels). Severe bilateral L3 foraminal stenosis, and on the right a
small foraminal synovial cyst estimated at 5 mm (series 3, image 6
and series 5, image 19) contributes to the severe stenosis.

L4-L5: Anterolisthesis with circumferential disc bulge and very
severe facet and ligament flavum hypertrophy, greater on the left.
Degenerative facet joint fluid on the right. Severe spinal and
bilateral lateral recess stenosis (L5 nerve levels). Moderate
bilateral L4 foraminal stenosis greater on the right.

L5-S1: Negative disc. Moderate facet degeneration. Degenerative
facet joint fluid. No significant stenosis.
IMPRESSION: 1. Mild spondylolisthesis at both L1-L2 and L4-L5 with disc and
severe posterior element degeneration. Degenerative marrow edema in
the endplates at L1-L2.

2. Up to Severe multifactorial spinal and lateral recess stenosis at
both L3-L4 and L4-L5. Severe neural foraminal stenosis at the L3
nerve levels, which on the right is in part related to a small
degenerative synovial cyst in the foramen. Query right L3
radiculitis.

3. L1-L2 mild spinal stenosis but moderate right lateral recess and
severe right foraminal stenosis.

4. Mild to moderate spinal stenosis at L2-L3 with moderate right
foraminal stenosis.

## 2022-08-27 DIAGNOSIS — M9901 Segmental and somatic dysfunction of cervical region: Secondary | ICD-10-CM | POA: Diagnosis not present

## 2022-08-27 DIAGNOSIS — M545 Low back pain, unspecified: Secondary | ICD-10-CM | POA: Diagnosis not present

## 2022-08-27 DIAGNOSIS — M9905 Segmental and somatic dysfunction of pelvic region: Secondary | ICD-10-CM | POA: Diagnosis not present

## 2022-08-27 DIAGNOSIS — M9903 Segmental and somatic dysfunction of lumbar region: Secondary | ICD-10-CM | POA: Diagnosis not present

## 2022-09-04 ENCOUNTER — Other Ambulatory Visit: Payer: Self-pay | Admitting: Cardiovascular Disease

## 2022-09-09 ENCOUNTER — Encounter: Payer: Self-pay | Admitting: Cardiovascular Disease

## 2022-09-09 ENCOUNTER — Ambulatory Visit: Payer: Medicare HMO | Attending: Cardiovascular Disease | Admitting: Cardiovascular Disease

## 2022-09-09 DIAGNOSIS — Z79899 Other long term (current) drug therapy: Secondary | ICD-10-CM | POA: Diagnosis not present

## 2022-09-09 DIAGNOSIS — I25110 Atherosclerotic heart disease of native coronary artery with unstable angina pectoris: Secondary | ICD-10-CM

## 2022-09-09 DIAGNOSIS — R600 Localized edema: Secondary | ICD-10-CM | POA: Diagnosis not present

## 2022-09-09 DIAGNOSIS — Z951 Presence of aortocoronary bypass graft: Secondary | ICD-10-CM | POA: Diagnosis not present

## 2022-09-09 DIAGNOSIS — I440 Atrioventricular block, first degree: Secondary | ICD-10-CM

## 2022-09-09 DIAGNOSIS — I10 Essential (primary) hypertension: Secondary | ICD-10-CM | POA: Diagnosis not present

## 2022-09-09 DIAGNOSIS — E7849 Other hyperlipidemia: Secondary | ICD-10-CM

## 2022-09-09 DIAGNOSIS — E669 Obesity, unspecified: Secondary | ICD-10-CM

## 2022-09-09 MED ORDER — AMLODIPINE BESYLATE 5 MG PO TABS: 5.0000 mg | ORAL_TABLET | Freq: Every day | ORAL | 3 refills | Status: DC

## 2022-09-09 NOTE — Patient Instructions (Addendum)
Medication Instructions:  Increase Amlodipine to 5 mg daily Take HCTZ 12.5 mg every other day Take Lasix 20 mg daily if needed  Continue all other medications *If you need a refill on your cardiac medications before your next appointment, please call your pharmacy*   Lab Work: Cmet,cbc,tsh,lipid panel,LPa  today   Testing/Procedures: Schedule Echo in May 2024   Follow-Up: At [facility], you and your health needs are our priority.  As part of our continuing mission to provide you with exceptional heart care, we have created designated Provider Care Teams.  These Care Teams include your primary Cardiologist (physician) and Advanced Practice Providers (APPs -  Physician Assistants and Nurse Practitioners) who all work together to provide you with the care you need, when you need it.  We recommend signing up for the patient portal called "MyChart".  Sign up information is provided on this After Visit Summary.  MyChart is used to connect with patients for Virtual Visits (Telemedicine).  Patients are able to view lab/test results, encounter notes, upcoming appointments, etc.  Non-urgent messages can be sent to your provider as well.   To learn more about what you can do with MyChart, go to [email or web address].    Your next appointment:  6 months after Echo   Call in Jan to schedule May appointment     The format for your next appointment: Office   Provider:  [facility]   Important Information About Sugar

## 2022-09-09 NOTE — Progress Notes (Signed)
Patient ID: Donya Tomaro, male   DOB: [birth year], 76 y.o.   MRN: [id number]       HPI:  Isaiyah Feldhaus is a 76 y.o. male who is a former patient of Dr. Rollene Fare.  He presents for 2 year follow-up evaluation.  Mr. Delker has a long-standing history of familial hyperlipidemia.  His father and one of his brothers had familial hyperlipidemia.   His father died at age 76 secondary to advanced coronary artery disease.  Mr. Batch cholesterols in the past had been as high as 400.  He was initially diagnosed in the 1970s.  He has participated in numerous drug studies over the years and was one of the precipitants in the initial statin trials with Mevacor.  As part of one of his studies, he underwent diagnostic cardiac catheterization in [place].  He was asymptomatic with chest pain but was found to have mild coronary obstructive disease with 20-40% RCA stenoses, 20-30% circumflex stenoses,  At least moderate LAD stenosis with poststenotic dilatation involving the diagonal and septal perforating artery in 1987.  He has not been on medical therapy for CAD, with the exception of aggressive lipid-lowering treatment.  His last nuclear perfusion study was in 2012, which continue to show normal perfusion.  There was attenuation artifact.  Post-rest ejection fraction was 53%. In June 2014 an echo Doppler study  showed an ejection fraction of 55-60%; Normal diastolic parameters.  He had mild left atrial dilatation.  There was mild aortic valve sclerosis without stenosis.  The patient tells me he has been aggressive with his follow-up.  He typically has laboratory checked via life extension on his own. Remotely, he also underwent VAP testing to determine particles size and numbers.  H e has been on Zetia 10 mg Crestor 40 mg, Niaspan 2000 mg and 2000 mg of omega-3 fatty acids.  I did review blood work that he had had in April 2015 from life extension and at that time on aggressive medical therapy.  His total cholesterol  was 185, triglycerides 59, HDL cholesterol 82, LDL cholesterol 91.  Homocysteine was 11.7.  Hemoglobin A1c was 5.7.  Thyroid function studies were normal.  He was not anemic.  He had normal renal function.  He participated in this study looking at possible side effects from the injection site for PCSK9 inhibitor therapy.  He tells me his brother  underwent stenting of his coronary artery age 61.  The patient also has a history of gout, and hypertension.  He has been tolerating my Micardis 80 mg without side effects.  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  He denies chest pain.  He denies PND, orthopnea.  He denies palpitations.  In February 2016 he had an NMR LipoProfile.  Total cholesterol was 185, triglycerides 72, HDL 94, LDL C 77, and LDL particle number was excellent at 683.  Insulin resistance score was 31.  In March 2016  a nuclear perfusion study continued  to show normal perfusion and was low risk with probable bowel attenuation artifact.  As part of his own blood testing he had another lipid panel in August 2016 which showed a total cholesterol 140, HDL 50, LDL 77, and very normal triglyceride levels.  He had purposeful weight loss of 30 pounds.  He tells me he had undergone hip surgery by Dr. Lonzo Cloud in [place], [place].  He underwent an NMR LipoProfile on 12/20/2015.  Total cholesterol is 153, HDL 76, triglycerides 45, and calculated LDL was 68.  He had 1241, LDL particles which were predominantly large.  There were only 196 small LDL particles.  April lipoprotein B was normal at 61 as was LPa at 14.  He again had his labs rechecked as part of life extension, blood testing in June 2017.  He has a remote history of gout and has been taking a you.  All and uric acid was 3.3.  Renal function was normal.  He had normal LFTs.  Lipid studies revealed a total cholesterol of 167, triglycerides 46, HDL 85, and LDL 73.  Homocysteine was normal at 9.5.  PSA was 0.8.  Testosterone was low at  297.  Hemoglobin A1c was excellent at 5.3.  TSH was normal.  He was not anemic with hemoglobin of 13.5, hematocrit of 42.3.  MCV was minimally increased at 102.  Vitamin D level was normal at 95.7.  He had been taking vitamin D 10,000 units daily supplementation  Since I last saw him one year ago, he has remained active.  Over the past year he remains active.  He plays golf at least 2 days per week.  days a week.  He walks several other days.  He continues to work as a Engineer, petroleum.  He is widowed and not sexually active.   He underwent follow-up laboratory on 04/14/2017 at life extension.  BUN 16, creatinine 0.96.  Total cholesterol 189, triglycerides 71, HDL 89, LDL 86.  Homocysteine 14.5.  C-reactive protein 2.1.  Hemoglobin 14.1/hematocrit 42.8.  Serum testosterone 196.  TSH 2.6.  Vitamin D 50.2.  Apo lipoprotein B 81.    When I  saw him in December 2018, I had a long discussion with reference to PCSK 9 inhibition in light of his familial hyperlipidemia.  He has since been started on Praluent 150 mg every 2-week injections and had received 2 months of treatment.  Follow-up laboratory 2 days ago has shown a total cholesterol of 139, triglycerides 66, HDL 87, and LDL 39.  He has not had any side  effects to the medication.  His insurance company had denied Repatha.  He continues to be on concomitant therapy with Zetia 10 mg and rosuvastatin 40 mg.  He also is on Toprol-XL 25 mg and telmisartan 80 mg for hypertension.  He continues to work in a Scientist, physiological.  He recently returned from a two-week trip to [place] to visit his son who lives there.    I saw him in April 2019.  He continued to be asymptomatic with reference to chest pain, and did not have any change in exercise tolerance.  Lipid studies at that time revealed a total cholesterol 139, triglycerides 66, HDL 87 and LDL cholesterol was 39.  On his own, he underwent laboratory by life extension in August 2019.  Chemistry  was normal.  Fasting glucose was 83.  Total cholesterol 114, triglycerides 42, HDL 81, and LDL was 25.  Homocystine was normal.  Vitamin D level was 63.2.  Insulin level was normal at 5.7.  Apo lipoprotein B was excellent at 32.  Life line screening screening was done by his own accord which he typically gets done every several years.  This now has shown improvement from previous evaluations with carotids now being totally normal without previous plaque.  His abdominal aorta was normal, there was no PVD.  Remains active.  He plays golf 2 times per week at a minimum.  He admits to weight gain over the holidays.   When I  saw him in the office in January 2020, he was remaining fairly stable.  However, over the past 6 months he has noticed some mild gradual increase in exertional shortness of breath.  He saw his primary physician, Dr. Hulan Fess and due to his exertional component of dyspnea he referred him back to our office and he saw Dr. Grayling Congress on July 10, 2019 for further evaluation.  An echo Doppler study on July 11, 2019 revealed EF at 60 to 65% with grade 1 diastolic dysfunction.  He was referred for a nuclear perfusion study which was done with a Lexiscan protocol and this showed clear change from his prior evaluations and was interpreted as high risk with transient ischemic dilatation, EF 39%, and ischemia in several distributions. There was significant discussion with Dr. Davina Poke and an attempt at scheduling him for coronary CTA was unsuccessful.  After much discussion the decision was made to proceed with cardiac catheterization which initially the patient had some reservation.  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  Apparently his angiographic data was reviewed at [facility] and decision was not to proceed with intervention or bypass at that time and he has been  on medical therapy for over 30 years.    I saw him for telemedicine evaluation on August 30, 2019 prior to undergoing his cardiac catheterization which was scheduled for the following day.  At that time, he told me that since he was  notified of his abnormal results on his own he increased aspirin from 81 mg up to 325 mg and he had old prescription of Plavix which had been prescribed by Dr. Rollene Fare approximately 8 years ago.  For the past several weeks he put himself back on his oral Plavix medication and he stated that he had checked on the Internet and felt that even though it may not be as potent as it may have been when initially prescribed to him he felt that this provided some additional coverage until his catheterization.  Cardiac catheterization was performed by me on August 31 2019.  This revealed severe native CAD with 80% calcified distal left main stenosis, 99% ostial LAD stenosis with total occlusion of the LAD after the proximal diagonal and septal perforating artery, 80% ostial circumflex stenosis followed by 80% proximal stenosis with extensive collateralization to a dominant RCA from the circumflex as well as faint collaterals to the distal LAD and total proximal RCA occlusion.  He underwent urgent surgical consultation and on September 01, 2019 he underwent successful CABG revascularization with a LIMA to the LAD, vein to the diagonal 1 and OM 2 and PLV with endoscopic vein harvesting from the right lower extremity.  He received IV Lasix for fluid overload.  He was discharged on September 06, 2019.  During his hospitalization, hydrochlorothiazide, Plavix, and telmisartan were discontinued prior to discharge due to bradycardia and hypotension.  He was evaluated by Bunnie Domino on September 18, 2019 and had URI symptoms and fever.  A chest x-ray did not reveal pneumonia and he tested negative for Covid, flu, and srep.  He has subsequently been evaluated by a physician and his primary care's  office and has undergone several courses of antibiotics including Augmentin and doxycycline, but most recently levofloxacin.  His fever has resolved and he is feeling better but he still admits to occasional cough.   I saw him for his  initial post hospital evaluation with me on September 29, 2019.  He has not had any pulmonary evaluation.  From a cardiac perspective, he admits to residual swelling of his right lower extremity.  He had been on Lasix 40 mg daily but this was discontinued approximately 3 weeks ago after he had seen Dr. Kipp Brood who performed his CABG revascularization surgery.  He continues to be on Praluent 150 mg every 2 weeks, rosuvastatin 40 mg and Zetia 10 mg for his hereditary hyperlipidemia.  He is unaware of palpitations.  He denies presyncope or syncope.  During that evaluation, he had tense lower extremity edema.  I recommended reinstitution of furosemide initially at 20 mg for the next 5 to 7 days then potentially as needed.  I change his metoprolol to succinate 25 mg daily.  He was on Praluent 150 mg, rosuvastatin and Zetia for familial hyperlipidemia.  I last saw him on September 12, 2020.  Since his prior evaluation he has remained stable. He had significant benefit from initial furosemide and now has been taking 20 mg 2 times per week.  However he still experiences mild right lower edema.  Since his CABG revascularization, he notes improvement in previous exertional dyspnea.  He had never experienced significant chest pressure.  He has been participating in hard strides cardiac rehab at [facility] which will be for 3 months duration.  He underwent repeat laboratory on January 01, 2020.  Total cholesterol is now 115, triglycerides 82, HDL 80, and LDL cholesterol 19 on Praluent, high potency statin and Zetia therapy.  He denies any palpitations.  He denies presyncope or syncope.    Since I last saw him, he has remained fairly stable.  He is working in [employer] in [employer].  He typically plays golf several times per week and goes to the gym 2 times per week doing both strength and balance.  At times he has noted some occasional leg swelling right greater than left.  He also admits to GERD.  He is now followed by Dr. Drema Dallas at [place] for primary care.  He tells me his blood pressure oftentimes may be in the 140-150 range.  He continues to be on amlodipine at low-dose 2.5 mg, and has been taking as needed hydrochlorothiazide or furosemide.  He is on metoprolol succinate 25 mg daily and telmisartan 80 mg.  He continues to be on Praluent 150 mg/mL every 2 weeks, rosuvastatin 40 mg, and Zetia 10 mg.  He presents for a 2-year follow-up evaluation.  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   left post shoulder-sup (CX35FU), left post leg    Coronary artery disease    Gout    Of the left third, fourth, and fifth MTPareas with swelling of left foot and pain   Hyperlipidemia    Hypertension     Past Surgical History:  Procedure Laterality Date   CARDIAC CATHETERIZATION  01/04/1986   CORONARY ARTERY BYPASS GRAFT N/A 09/01/2019   Procedure: CORONARY ARTERY BYPASS GRAFTING (CABG) x Four , using left internal mammary artery and right leg greater saphenous vein harvested endoscopically;  Surgeon: Lajuana Matte, MD;  Location: [facility];  Service: Open Heart Surgery;  Laterality: N/A;   LEFT HEART CATH AND CORONARY ANGIOGRAPHY N/A 08/31/2019   Procedure: LEFT HEART CATH AND CORONARY ANGIOGRAPHY;  Surgeon: Troy Sine, MD;  Location:  [facility] INVASIVE CV LAB;  Service: Cardiovascular;  Laterality: N/A;   TEE WITHOUT CARDIOVERSION N/A 09/01/2019   Procedure: Transesophageal Echocardiogram (Tee);  Surgeon: Lajuana Matte, MD;  Location: [facility];  Service: Open Heart Surgery;  Laterality: N/A;    No Known Allergies  Current Outpatient Medications  Medication  Sig Dispense Refill   alendronate (FOSAMAX) 70 MG tablet Take 70 mg by mouth once a week.     amLODipine (NORVASC) 5 MG tablet Take 1 tablet (5 mg total) by mouth daily. 90 tablet 3   aspirin EC 81 MG tablet Take 81 mg by mouth daily.     Azelastine HCl 0.15 % SOLN daily.     ezetimibe (ZETIA) 10 MG tablet Take 1 tablet (10 mg total) by mouth daily. 90 tablet 3   fluticasone (FLONASE) 50 MCG/ACT nasal spray daily.     furosemide (LASIX) 20 MG tablet Take 20 mg daily if needed 90 tablet 3   hydrochlorothiazide (MICROZIDE) 12.5 MG capsule TAKE ONE CAPSULE BY MOUTH EVERY OTHER DAY TO EVERY 3RD DAY DEPENDING ON SWELLING AND BLOOD PRESSURE 45 capsule 1   metoprolol succinate (TOPROL-XL) 25 MG 24 hr tablet TAKE 1 TABLET BY MOUTH EVERY DAY 90 tablet 3   NONFORMULARY OR COMPOUNDED ITEM TOPICAL 2% CHOLESTEROL 2% LOVASTATIN CREAM  APPLY TO AFFECTED AREA BID 2 each 2   Oxymetazoline HCl (NASAL SPRAY) 0.05 % SOLN Place 1 spray into the nose daily as needed (congestion).     PRALUENT 150 MG/ML SOAJ INJECT 150 MG UNDER THE SKIN EVERY 14 DAYS 2 mL 11   rosuvastatin (CRESTOR) 40 MG tablet Take 1 tablet (40 mg total) by mouth daily. 90 tablet 3   telmisartan (MICARDIS) 80 MG tablet TAKE ONE TABLET BY MOUTH DAILY, PLEASE SCHEDULE A FOLLOW UP VISIT WITH PROVIDER FOR FURTHER REFILLS 90 tablet 0   No current facility-administered medications for this visit.    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  He still goes to several board meetingS .  t Workhroughout the year he is married and has 2 children, ages 64 and 27.  He has 2 grandchildren.  There is no history of tobacco use.  He does not drink alcohol.  Family history is notable that his mother died at age 21 with old age.  Father died at age 32 with advanced atherosclerosis.  He has one brother who is status post coronary stenting.  ROS General: Negative; No fevers, chills, or night sweats HEENT:  Negative; No changes in vision or hearing, sinus congestion, difficulty swallowing Pulmonary: Negative; No cough, wheezing, shortness of breath, hemoptysis Cardiovascular:  See HPI; No chest pain, presyncope, syncope, palpitations,  Hives he may note some trace ankle swelling GI: Negative; No nausea, vomiting, diarrhea, or abdominal pain GU: Negative; No dysuria, hematuria, or difficulty voiding Musculoskeletal: Negative; no myalgias, joint pain, or weakness Hematologic/Oncologic: Negative; no easy bruising, bleeding Neurologic: Positive for gout Endocrine: Negative; no heat/cold intolerance; no diabetes Neuro: Negative; no changes in balance, headaches Skin: Osteophytic for chronic rash  Psychiatric: Negative; No behavioral problems, depression Sleep: Negative; No daytime sleepiness, hypersomnolence, bruxism, restless legs, hypnogagnic hallucinations Other comprehensive 14 point system review is negative   Physical Exam BP (!) 126/90   Pulse 69   Ht _0  (1.727 m)   Wt 220 lb (99.8 kg)   SpO2 93%   BMI 33.45 kg/m    Repeat blood pressure by me was 140/84.  Wt Readings from Last 3 Encounters:  09/09/22 220 lb (99.8 kg)  12/22/21 218 lb 9.6 oz (99.2 kg)  09/12/21 217 lb (98.4 kg)   General: Alert, oriented, no distress.  Skin: normal turgor, no rashes, warm and dry HEENT: Normocephalic, atraumatic. Pupils equal round and reactive to light; sclera anicteric; extraocular muscles intact;  Nose without nasal septal hypertrophy Mouth/Parynx benign; Mallinpatti scale 3 Neck: No JVD, no carotid bruits; normal carotid upstroke Lungs: clear to ausculatation and percussion; no wheezing or rales Chest wall: without tenderness to palpitation Heart: PMI not displaced, RRR, s1 s2 normal, 1/6 systolic murmur and 1/6 diastolic murmur aortic region, no rubs, gallops, thrills, or heaves Abdomen: soft, nontender; no hepatosplenomehaly, BS+; abdominal aorta nontender and not dilated by  palpation. Back: no CVA tenderness Pulses 2+ Musculoskeletal: full range of motion, normal strength, no joint deformities Extremities: Trace right ankle swelling no clubbing cyanosis, Homan's sign negative  Neurologic: grossly nonfocal; Cranial nerves grossly wnl Psychologic: Normal mood and affect     September 09, 2022 ECG (independently read by me): NSR at 61, 1st degree block, PR 234 msec.  January 02, 2020 ECG (independently read by me): Normal sinus rhythm at 81 bpm.  No ectopy.  Normal intervals.  Mild RV conduction delay   September 29, 2019 ECG (independently read by me): Normal sinus rhythm at 70 bpm.  Borderline first-degree AV block with a PR level 206 ms.  Nonspecific T wave abnormality in V1 and V2  January 2020 ECG (independently read by me): Sinus rhythm at 81 bpm.  Mild first-degree AV block with a PR of 206 ms.  Nonspecific T changes.  02/11/2018 ECG (independently read by me): Sinus rhythm at 60 bpm.  First-degree AV block.  No ectopy.  December 2018 ECG (independently read by me): Normal sinus rhythm at 72 bpm.  Early transition.  First-degree AV block with a PR interval of 206 ms.  October 2016 ECG (independently read by me): Normal sinus rhythm at 62.  No ectopy.  No ST segment changes.  ECG (independently read by me): Normal sinus rhythm at 71 bpm.  No ectopy.  QTc interval 441 ms  LABS:    Latest Ref Rng & Units 09/09/2022   11:53 AM 10/08/2020   11:16 AM 01/01/2020   10:29 AM  BMP  Glucose 70 - 99 mg/dL 80  97  73   BUN 8 - 27 mg/dL _0 Creatinine 0.76 - 1.27 mg/dL 1.05  1.16  1.02   BUN/Creat Ratio 10 - _1 Sodium 134 - 144 mmol/L 143  141  141   Potassium 3.5 - 5.2 mmol/L 4.7  4.8  4.8   Chloride 96 - 106 mmol/L 100  102  102   CO2 20 - 29 mmol/L _2 Calcium 8.6 - 10.2 mg/dL 9.6  9.5  9.4       Latest Ref Rng & Units 09/09/2022   11:53 AM 10/08/2020   11:16 AM 01/01/2020   10:29 AM  Hepatic Function  Total Protein 6.0 -  8.5 g/dL 7.5  6.5  6.7   Albumin 3.8 - 4.8 g/dL 5.0  4.5  4.4   AST 0 - 40 IU/L _3 ALT 0 - 44 IU/L _4 Alk Phosphatase 44 - 121 IU/L 57  54  56   Total Bilirubin 0.0 -  1.2 mg/dL 0.5  0.8  0.3       Latest Ref Rng & Units 09/09/2022   11:53 AM 10/08/2020   11:16 AM 01/01/2020   10:29 AM  CBC  WBC 3.4 - 10.8 x10E3/uL 6.6  5.1  5.5   Hemoglobin 13.0 - 17.7 g/dL 14.1  13.5  13.4   Hematocrit 37.5 - 51.0 % 42.4  39.9  41.7   Platelets 150 - 450 x10E3/uL 177  142  162    Lab Results  Component Value Date   MCV 98 (H) 09/09/2022   MCV 100 (H) 10/08/2020   MCV 96 01/01/2020   Lab Results  Component Value Date   TSH 1.670 09/09/2022   Lipid Panel     Component Value Date/Time   CHOL 135 09/09/2022 1153   CHOL 153 12/20/2015 0949   TRIG 72 09/09/2022 1153   TRIG 45 12/20/2015 0949   TRIG 72 12/06/2014 0944   HDL 83 09/09/2022 1153   HDL 76 12/20/2015 0949   HDL 94 12/06/2014 0944   CHOLHDL 1.6 09/09/2022 1153   CHOLHDL 2.0 12/20/2015 0949   LDLCALC 38 09/09/2022 1153   LDLCALC 68 12/20/2015 0949   RADIOLOGY: No results found.  CARDIAC STUDIES: Ost RCA to Prox RCA lesion is 100% stenosed. Dist LM to Prox LAD lesion is 99% stenosed. Ost Cx lesion is 80% stenosed. Prox LAD to Mid LAD lesion is 100% stenosed. Mid LM lesion is 80% stenosed. Prox Cx lesion is 80% stenosed.   Severe  native coronary artery disease with 80% calcified distal left main stenosis; 99% ostial LAD stenosis with total occlusion of the LAD after the proximal diagonal and septal perforating artery; 80% ostial circumflex stenosis followed by 80% proximal stenosis with extensive collateralization to a dominant RCA from the circumflex vessel as well as faint collaterals to the distal LAD, and total proximal RCA occlusion.   LVEDP 14 mm   RECOMMENDATION:  Urgent Surgical consultation will be obtained for CABG revascularization.  A stat P2Y12 blood test was sent from the laboratory to  assess the antiplatelet effect of his self prescribed 8 years outdated Plavix with his last dose 2 days ago.    Intervention   IMPRESSION:  1. Coronary artery disease involving native coronary artery of native heart without  angina pectoris ([place])   2. S/P CABG x 4   3. Familial hyperlipidemia   4. Essential hypertension   5. Lower extremity edema   6. Mild obesity   7. First degree heart block   8. Medication management     ASSESSMENT AND PLAN: Mr. Taeshaun Rames is a 76 year-old gentleman who has a history of heterozygous familial hyperlipidemia and was initially noted to have cholesterols in excess of 400 in the 1970s.  His father and brother also has similar history.  His 2 children do not have this disorder.  He has been on aggressive lipid-lowering therapy ever since statins were initially introduced and had participated in the early clinical trials of lovastatin and simvastatin.  He had previously been demonstrated to have coronary atherosclerosis by cardiac catheterization in the 1980s.  In 2016 prior to undergoing hip surgery a four-year follow-up nuclear perfusion study continued to be low risk and did not reveal any significant perfusion abnormalities.  His familial hyperlipidemia has been treated with Praluent 150 mg every 14 days, rosuvastatin 40 mg, Zetia 10 mg in addition to omega-3 fatty acid.  When I had  seen him I discontinued niacin.  Lipid studies were outstanding and most recent evaluation showed a total cholesterol 114 with an LDL cholesterol of 25.  Triglycerides were 42 with HDL 81.  Apo lipoprotein B was 32.  Over the late spring and early summer of 2020, Mr. O'Meara developed progressive exertional dyspnea.  An echo Doppler study on September 1,2020 showed an EF at 60 to 65%.  A nuclear perfusion study was very high risk and EF post-rest was 39% and there was evidence for transient ischemic dilatation with ischemia in several vascular distributions.  Cardiac  catheterization revealed life-threatening anatomy as noted above and the following day he underwent urgent coronary revascularization by Dr. Kipp Brood successfully.  Following his surgery, he had had issues with fever increased cough and shortness of breath and had several courses of antibiotic therapy with improvement.   He tested negative for Covid.  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  I had last seen him in November 2021.  Presently, he has done well and continues to be without recurrent chest pain or significant shortness of breath.  At times he notes occasional right lower extremity swelling.  He also has some issues with GERD for which he had taken Prilosec but most recently Pepcid.  His blood pressure today is elevated by me at 140/84 and he tells me sometimes his blood pressure in the morning is in the 140s or 150s.  I am recommending he increase amlodipine to 5 mg from his present dose of 2.5 mg daily.  He has prescriptions for furosemide and hydrochlorothiazide but has not taken them.  I have suggested he initiate HCTZ at 12.5 mg every other day and depending upon leg swelling he can increase this to daily.  He will continue his current dose of metoprolol succinate 25 mg.  Heart rate today is 69 and he has first-degree AV block.  I am recommending a complete set of fasting laboratory with a comprehensive metabolic panel, CBC, fasting lipid studies, LP(a), TSH, I will also check  heat hemoglobin A1c.  I am recommending in 6 months he have a follow-up echo Doppler study for reassessment of LV function and valvular architecture.  I will see him in follow-up and further recommendations were made at that time.     Presently, he continues to feel well.  His blood pressure on recheck by me today was 130/82 and he continues now to  take furosemide only on an as-needed basis and continues to be on metoprolol succinate 25 mg daily and telmisartan 80 mg for hypertension control.  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  LDL cholesterol in February 2021 was 19.  He has recently developed chronic back pain for which she saw Dr. Marcello Moores.  He had been on Celebrex which was initiated after diclofenac was not helpful.  He received a steroid injection today by Dr. Vertell Limber at L3-4-5 region.  At times he still notes some mild ankle swelling.  I have suggested that he can take HCTZ 12.5 mg every other or every third day depending upon blood pressure with target blood pressure less than 130/80 and ideal blood pressure less than 120/80.  I have recommended follow-up laboratory particularly his renal function on Celebrex.  I will see him in 6 months for follow-up evaluation or sooner  as necessary.  Troy Sine, MD, [facility] 09/13/2022 9:11 AM

## 2022-09-10 LAB — COMPREHENSIVE METABOLIC PANEL
ALT: 23 IU/L (ref 0–44)
AST: 31 IU/L (ref 0–40)
Albumin/Globulin Ratio: 2 (ref 1.2–2.2)
Albumin: 5 g/dL — ABNORMAL HIGH (ref 3.8–4.8)
Alkaline Phosphatase: 57 IU/L (ref 44–121)
BUN/Creatinine Ratio: 12 (ref 10–24)
BUN: 13 mg/dL (ref 8–27)
Bilirubin Total: 0.5 mg/dL (ref 0.0–1.2)
CO2: 24 mmol/L (ref 20–29)
Calcium: 9.6 mg/dL (ref 8.6–10.2)
Chloride: 100 mmol/L (ref 96–106)
Creatinine, Ser: 1.05 mg/dL (ref 0.76–1.27)
Globulin, Total: 2.5 g/dL (ref 1.5–4.5)
Glucose: 80 mg/dL (ref 70–99)
Potassium: 4.7 mmol/L (ref 3.5–5.2)
Sodium: 143 mmol/L (ref 134–144)
Total Protein: 7.5 g/dL (ref 6.0–8.5)
eGFR: 73 mL/min/{1.73_m2} (ref >59)

## 2022-09-10 LAB — CBC WITH DIFFERENTIAL/PLATELET
Basophils Absolute: 0.1 10*3/uL (ref 0.0–0.2)
Basos: 1 %
EOS (ABSOLUTE): 0.1 10*3/uL (ref 0.0–0.4)
Eos: 1 %
Hematocrit: 42.4 % (ref 37.5–51.0)
Hemoglobin: 14.1 g/dL (ref 13.0–17.7)
Immature Grans (Abs): 0 10*3/uL (ref 0.0–0.1)
Immature Granulocytes: 0 %
Lymphocytes Absolute: 1.5 10*3/uL (ref 0.7–3.1)
Lymphs: 23 %
MCH: 32.6 pg (ref 26.6–33.0)
MCHC: 33.3 g/dL (ref 31.5–35.7)
MCV: 98 fL — ABNORMAL HIGH (ref 79–97)
Monocytes Absolute: 0.6 10*3/uL (ref 0.1–0.9)
Monocytes: 9 %
Neutrophils Absolute: 4.4 10*3/uL (ref 1.4–7.0)
Neutrophils: 66 %
Platelets: 177 10*3/uL (ref 150–450)
RBC: 4.32 x10E6/uL (ref 4.14–5.80)
RDW: 13.9 % (ref 11.6–15.4)
WBC: 6.6 10*3/uL (ref 3.4–10.8)

## 2022-09-10 LAB — TSH: TSH: 1.67 u[IU]/mL (ref 0.450–4.500)

## 2022-09-10 LAB — HEMOGLOBIN A1C
Est. average glucose Bld gHb Est-mCnc: 108 mg/dL
Hgb A1c MFr Bld: 5.4 % (ref 4.8–5.6)

## 2022-09-10 LAB — LIPID PANEL
Chol/HDL Ratio: 1.6 ratio (ref 0.0–5.0)
Cholesterol, Total: 135 mg/dL (ref 100–199)
HDL: 83 mg/dL (ref >39)
LDL Chol Calc (NIH): 38 mg/dL (ref 0–99)
Triglycerides: 72 mg/dL (ref 0–149)
VLDL Cholesterol Cal: 14 mg/dL (ref 5–40)

## 2022-09-10 LAB — LIPOPROTEIN A (LPA): Lipoprotein (a): 12.8 nmol/L (ref <75.0)

## 2022-09-13 ENCOUNTER — Encounter: Payer: Self-pay | Admitting: Cardiovascular Disease

## 2022-09-16 ENCOUNTER — Other Ambulatory Visit: Payer: Self-pay | Admitting: General Practice

## 2022-09-24 DIAGNOSIS — M9905 Segmental and somatic dysfunction of pelvic region: Secondary | ICD-10-CM | POA: Diagnosis not present

## 2022-09-24 DIAGNOSIS — M545 Low back pain, unspecified: Secondary | ICD-10-CM | POA: Diagnosis not present

## 2022-09-24 DIAGNOSIS — M9901 Segmental and somatic dysfunction of cervical region: Secondary | ICD-10-CM | POA: Diagnosis not present

## 2022-09-24 DIAGNOSIS — M9903 Segmental and somatic dysfunction of lumbar region: Secondary | ICD-10-CM | POA: Diagnosis not present

## 2022-09-25 ENCOUNTER — Other Ambulatory Visit: Payer: Self-pay | Admitting: General Practice

## 2022-09-25 ENCOUNTER — Encounter: Payer: Self-pay | Admitting: Cardiovascular Disease

## 2022-09-25 MED ORDER — EZETIMIBE 10 MG PO TABS: 10.0000 mg | ORAL_TABLET | Freq: Every day | ORAL | 4 refills | Status: DC

## 2022-09-29 DIAGNOSIS — Z1211 Encounter for screening for malignant neoplasm of colon: Secondary | ICD-10-CM | POA: Diagnosis not present

## 2022-09-29 DIAGNOSIS — R1013 Epigastric pain: Secondary | ICD-10-CM | POA: Diagnosis not present

## 2022-09-29 DIAGNOSIS — Z800 Family history of malignant neoplasm of digestive organs: Secondary | ICD-10-CM | POA: Diagnosis not present

## 2022-10-07 DIAGNOSIS — J0181 Other acute recurrent sinusitis: Secondary | ICD-10-CM | POA: Diagnosis not present

## 2022-10-10 ENCOUNTER — Other Ambulatory Visit: Payer: Self-pay | Admitting: Cardiovascular Disease

## 2022-10-22 DIAGNOSIS — M545 Low back pain, unspecified: Secondary | ICD-10-CM | POA: Diagnosis not present

## 2022-10-22 DIAGNOSIS — M9903 Segmental and somatic dysfunction of lumbar region: Secondary | ICD-10-CM | POA: Diagnosis not present

## 2022-10-22 DIAGNOSIS — M9905 Segmental and somatic dysfunction of pelvic region: Secondary | ICD-10-CM | POA: Diagnosis not present

## 2022-10-22 DIAGNOSIS — M9901 Segmental and somatic dysfunction of cervical region: Secondary | ICD-10-CM | POA: Diagnosis not present

## 2022-11-10 ENCOUNTER — Telehealth: Payer: Self-pay | Admitting: Cardiovascular Disease

## 2022-11-10 NOTE — Telephone Encounter (Signed)
   Pre-operative Risk Assessment    Patient Name: [name] [name]  DOB: 1945/12/14 MRN: [id number]      Request for Surgical Clearance    Procedure:   Endoscopy and Colonoscopy   Date of Surgery:  Clearance 11/12/22                                 Surgeon:  Dr. Arta Silence Surgeon's Group or Practice Name:  [facility]  Phone number:  [phone] Fax number:  [phone]   Type of Clearance Requested:   - Medical    Type of Anesthesia:  MAC   Additional requests/questions:    SignedTrilby Drummer   11/10/2022, 1:38 PM

## 2022-11-10 NOTE — Telephone Encounter (Signed)
   Patient Name: [name] [name]  DOB: 10/22/1946 MRN: [id number]  Primary Cardiologist: Shelva Majestic, MD  Chart reviewed as part of pre-operative protocol coverage. Given past medical history and time since last visit, based on ACC/AHA guidelines, Kenson Groh is at acceptable risk for the planned procedure without further cardiovascular testing.   The patient was advised that if he develops new symptoms prior to surgery to contact our office to arrange for a follow-up visit, and he verbalized understanding.  I will route this recommendation to the requesting party via Epic fax function and remove from pre-op pool.  Please call with questions.  Mable Fill, Marissa Nestle, NP 11/10/2022, 2:08 PM

## 2022-11-11 ENCOUNTER — Encounter: Payer: Self-pay | Admitting: Pharmacist

## 2022-11-11 ENCOUNTER — Encounter: Payer: Self-pay | Admitting: Cardiovascular Disease

## 2022-11-11 NOTE — Telephone Encounter (Signed)
This encounter was created in error - please disregard.

## 2022-11-12 DIAGNOSIS — K5730 Diverticulosis of large intestine without perforation or abscess without bleeding: Secondary | ICD-10-CM | POA: Diagnosis not present

## 2022-11-12 DIAGNOSIS — D122 Benign neoplasm of ascending colon: Secondary | ICD-10-CM | POA: Diagnosis not present

## 2022-11-12 DIAGNOSIS — Z800 Family history of malignant neoplasm of digestive organs: Secondary | ICD-10-CM | POA: Diagnosis not present

## 2022-11-12 DIAGNOSIS — K648 Other hemorrhoids: Secondary | ICD-10-CM | POA: Diagnosis not present

## 2022-11-12 DIAGNOSIS — R1013 Epigastric pain: Secondary | ICD-10-CM | POA: Diagnosis not present

## 2022-11-12 DIAGNOSIS — K2930 Chronic superficial gastritis without bleeding: Secondary | ICD-10-CM | POA: Diagnosis not present

## 2022-11-12 DIAGNOSIS — D123 Benign neoplasm of transverse colon: Secondary | ICD-10-CM | POA: Diagnosis not present

## 2022-11-12 DIAGNOSIS — Z1211 Encounter for screening for malignant neoplasm of colon: Secondary | ICD-10-CM | POA: Diagnosis not present

## 2022-11-12 DIAGNOSIS — D175 Benign lipomatous neoplasm of intra-abdominal organs: Secondary | ICD-10-CM | POA: Diagnosis not present

## 2022-11-12 MED ORDER — REPATHA SURECLICK 140 MG/ML ~~LOC~~ SOAJ: 1.0000 | SUBCUTANEOUS | 11 refills | Status: DC

## 2022-11-12 NOTE — Addendum Note (Signed)
Addended by: Maren Wiesen E on: 11/12/2022 12:58 PM   Modules accepted: Orders

## 2022-11-18 DIAGNOSIS — D123 Benign neoplasm of transverse colon: Secondary | ICD-10-CM | POA: Diagnosis not present

## 2022-11-18 DIAGNOSIS — D122 Benign neoplasm of ascending colon: Secondary | ICD-10-CM | POA: Diagnosis not present

## 2022-11-18 DIAGNOSIS — K2930 Chronic superficial gastritis without bleeding: Secondary | ICD-10-CM | POA: Diagnosis not present

## 2022-11-19 DIAGNOSIS — M9905 Segmental and somatic dysfunction of pelvic region: Secondary | ICD-10-CM | POA: Diagnosis not present

## 2022-11-19 DIAGNOSIS — M545 Low back pain, unspecified: Secondary | ICD-10-CM | POA: Diagnosis not present

## 2022-11-19 DIAGNOSIS — M9901 Segmental and somatic dysfunction of cervical region: Secondary | ICD-10-CM | POA: Diagnosis not present

## 2022-11-19 DIAGNOSIS — M9903 Segmental and somatic dysfunction of lumbar region: Secondary | ICD-10-CM | POA: Diagnosis not present

## 2022-11-22 ENCOUNTER — Other Ambulatory Visit: Payer: Self-pay | Admitting: Cardiovascular Disease

## 2022-12-06 ENCOUNTER — Other Ambulatory Visit: Payer: Self-pay | Admitting: Cardiovascular Disease

## 2022-12-15 ENCOUNTER — Ambulatory Visit: Payer: Medicare HMO | Admitting: Cardiovascular Disease

## 2022-12-24 DIAGNOSIS — M9903 Segmental and somatic dysfunction of lumbar region: Secondary | ICD-10-CM | POA: Diagnosis not present

## 2022-12-24 DIAGNOSIS — M545 Low back pain, unspecified: Secondary | ICD-10-CM | POA: Diagnosis not present

## 2022-12-24 DIAGNOSIS — M9901 Segmental and somatic dysfunction of cervical region: Secondary | ICD-10-CM | POA: Diagnosis not present

## 2022-12-24 DIAGNOSIS — M9905 Segmental and somatic dysfunction of pelvic region: Secondary | ICD-10-CM | POA: Diagnosis not present

## 2023-01-07 DIAGNOSIS — R102 Pelvic and perineal pain: Secondary | ICD-10-CM | POA: Diagnosis not present

## 2023-01-07 DIAGNOSIS — M160 Bilateral primary osteoarthritis of hip: Secondary | ICD-10-CM | POA: Diagnosis not present

## 2023-01-28 DIAGNOSIS — M545 Low back pain, unspecified: Secondary | ICD-10-CM | POA: Diagnosis not present

## 2023-01-28 DIAGNOSIS — M9901 Segmental and somatic dysfunction of cervical region: Secondary | ICD-10-CM | POA: Diagnosis not present

## 2023-01-28 DIAGNOSIS — M9903 Segmental and somatic dysfunction of lumbar region: Secondary | ICD-10-CM | POA: Diagnosis not present

## 2023-01-28 DIAGNOSIS — M9905 Segmental and somatic dysfunction of pelvic region: Secondary | ICD-10-CM | POA: Diagnosis not present

## 2023-03-02 DIAGNOSIS — M9905 Segmental and somatic dysfunction of pelvic region: Secondary | ICD-10-CM | POA: Diagnosis not present

## 2023-03-02 DIAGNOSIS — M9903 Segmental and somatic dysfunction of lumbar region: Secondary | ICD-10-CM | POA: Diagnosis not present

## 2023-03-02 DIAGNOSIS — M9901 Segmental and somatic dysfunction of cervical region: Secondary | ICD-10-CM | POA: Diagnosis not present

## 2023-03-02 DIAGNOSIS — M545 Low back pain, unspecified: Secondary | ICD-10-CM | POA: Diagnosis not present

## 2023-03-06 ENCOUNTER — Other Ambulatory Visit: Payer: Self-pay | Admitting: Cardiovascular Disease

## 2023-03-10 ENCOUNTER — Ambulatory Visit (HOSPITAL_COMMUNITY): Payer: Medicare HMO | Attending: Cardiology

## 2023-03-10 DIAGNOSIS — I517 Cardiomegaly: Secondary | ICD-10-CM | POA: Diagnosis not present

## 2023-03-10 DIAGNOSIS — E669 Obesity, unspecified: Secondary | ICD-10-CM | POA: Diagnosis not present

## 2023-03-10 DIAGNOSIS — I10 Essential (primary) hypertension: Secondary | ICD-10-CM | POA: Diagnosis not present

## 2023-03-10 DIAGNOSIS — I77810 Thoracic aortic ectasia: Secondary | ICD-10-CM

## 2023-03-10 DIAGNOSIS — I25110 Atherosclerotic heart disease of native coronary artery with unstable angina pectoris: Secondary | ICD-10-CM

## 2023-03-10 DIAGNOSIS — R9431 Abnormal electrocardiogram [ECG] [EKG]: Secondary | ICD-10-CM | POA: Diagnosis not present

## 2023-03-10 DIAGNOSIS — Z79899 Other long term (current) drug therapy: Secondary | ICD-10-CM | POA: Insufficient documentation

## 2023-03-10 DIAGNOSIS — E7849 Other hyperlipidemia: Secondary | ICD-10-CM | POA: Insufficient documentation

## 2023-03-10 DIAGNOSIS — I088 Other rheumatic multiple valve diseases: Secondary | ICD-10-CM | POA: Diagnosis not present

## 2023-03-10 LAB — ECHOCARDIOGRAM COMPLETE
Area-P 1/2: 3.83 cm2
S' Lateral: 3.1 cm

## 2023-03-16 ENCOUNTER — Ambulatory Visit: Payer: Medicare HMO | Attending: Cardiovascular Disease | Admitting: Cardiovascular Disease

## 2023-03-16 ENCOUNTER — Encounter: Payer: Self-pay | Admitting: Cardiovascular Disease

## 2023-03-16 VITALS — BP 114/68 | HR 86 | Ht 68.0 in | Wt 215.8 lb

## 2023-03-16 DIAGNOSIS — Z79899 Other long term (current) drug therapy: Secondary | ICD-10-CM | POA: Diagnosis not present

## 2023-03-16 DIAGNOSIS — E7849 Other hyperlipidemia: Secondary | ICD-10-CM

## 2023-03-16 DIAGNOSIS — I25110 Atherosclerotic heart disease of native coronary artery with unstable angina pectoris: Secondary | ICD-10-CM

## 2023-03-16 NOTE — Progress Notes (Unsigned)
Patient ID: [name] [name], male   DOB: [birth year], 76 y.o.   MRN: [id number]       HPI:  [name] [name] is a 77 y.o. male who is a former patient of Dr. Alanda Amass.  He presents for 6 month follow-up evaluation.  [name] [name] has a long-standing history of familial hyperlipidemia.  His father and one of his brothers had familial hyperlipidemia.   His father died at age 57 secondary to advanced coronary artery disease.  [name] [name] cholesterols in the past had been as high as 400.  He was initially diagnosed in the 1970s.  He has participated in numerous drug studies over the years and was one of the precipitants in the initial statin trials with Mevacor.  As part of one of his studies, he underwent diagnostic cardiac catheterization in [place].  He was asymptomatic with chest pain but was found to have mild coronary obstructive disease with 20-40% RCA stenoses, 20-30% circumflex stenoses,  At least moderate LAD stenosis with poststenotic dilatation involving the diagonal and septal perforating artery in 1987.  He has not been on medical therapy for CAD, with the exception of aggressive lipid-lowering treatment.  His last nuclear perfusion study was in 2012, which continue to show normal perfusion.  There was attenuation artifact.  Post-rest ejection fraction was 53%. In June 2014 an echo Doppler study  showed an ejection fraction of 55-60%; Normal diastolic parameters.  He had mild left atrial dilatation.  There was mild aortic valve sclerosis without stenosis.  The patient tells me he has been aggressive with his follow-up.  He typically has laboratory checked via life extension on his own. Remotely, he also underwent VAP testing to determine particles size and numbers.  H e has been on Zetia 10 mg Crestor 40 mg, Niaspan 2000 mg and 2000 mg of omega-3 fatty acids.  I did review blood work that he had had in April 2015 from life extension and at that time on aggressive medical therapy.  His total  cholesterol was 185, triglycerides 59, HDL cholesterol 82, LDL cholesterol 91.  Homocysteine was 11.7.  Hemoglobin A1c was 5.7.  Thyroid function studies were normal.  He was not anemic.  He had normal renal function.  He participated in this study looking at possible side effects from the injection site for PCSK9 inhibitor therapy.  He tells me his brother  underwent stenting of his coronary artery age 43.  The patient also has a history of gout, and hypertension.  He has been tolerating my Micardis 80 mg without side effects.  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  He denies chest pain.  He denies PND, orthopnea.  He denies palpitations.  In February 2016 he had an NMR LipoProfile.  Total cholesterol was 185, triglycerides 72, HDL 94, LDL C 77, and LDL particle number was excellent at 683.  Insulin resistance score was 31.  In March 2016  a nuclear perfusion study continued  to show normal perfusion and was low risk with probable bowel attenuation artifact.  As part of his own blood testing he had another lipid panel in August 2016 which showed a total cholesterol 140, HDL 50, LDL 77, and very normal triglyceride levels.  He had purposeful weight loss of 30 pounds.  He tells me he had undergone hip surgery by Dr. Glorious Peach in [place].  He underwent an NMR LipoProfile on 12/20/2015.  Total cholesterol is 153, HDL 76, triglycerides 45, and calculated LDL was 68.  He had 1241, LDL particles which were predominantly large.  There were only 196 small LDL particles.  April lipoprotein B was normal at 61 as was LPa at 14.  He again had his labs rechecked as part of life extension, blood testing in June 2017.  He has a remote history of gout and has been taking a you.  All and uric acid was 3.3.  Renal function was normal.  He had normal LFTs.  Lipid studies revealed a total cholesterol of 167, triglycerides 46, HDL 85, and LDL 73.  Homocysteine was normal at 9.5.  PSA was 0.8.   Testosterone was low at 297.  Hemoglobin A1c was excellent at 5.3.  TSH was normal.  He was not anemic with hemoglobin of 13.5, hematocrit of 42.3.  MCV was minimally increased at 102.  Vitamin D level was normal at 95.7.  He had been taking vitamin D 10,000 units daily supplementation  Since I last saw [name] [name] one year ago, he has remained active.  Over the past year he remains active.  He plays golf at least 2 days per week.  days a week.  He walks several other days.  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  He is widowed and not sexually active.   He underwent follow-up laboratory on 04/14/2017 at life extension.  BUN 16, creatinine 0.96.  Total cholesterol 189, triglycerides 71, HDL 89, LDL 86.  Homocysteine 14.5.  C-reactive protein 2.1.  Hemoglobin 14.1/hematocrit 42.8.  Serum testosterone 196.  TSH 2.6.  Vitamin D 50.2.  Apo lipoprotein B 81.    When I  saw [name] [name] in December 2018, I had a long discussion with reference to PCSK 9 inhibition in light of his familial hyperlipidemia.  He has since been started on Praluent 150 mg every 2-week injections and had received 2 months of treatment.  Follow-up laboratory 2 days ago has shown a total cholesterol of 139, triglycerides 66, HDL 87, and LDL 39.  He has not had any side  effects to the medication.  His insurance company had denied Repatha.  He continues to be on concomitant therapy with Zetia 10 mg and rosuvastatin 40 mg.  He also is on Toprol-XL 25 mg and telmisartan 80 mg for hypertension.  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  He recently returned from a two-week trip to [place] to visit his son who lives there.    I saw [name] [name] in April 2019.  He continued to be asymptomatic with reference to chest pain, and did not have any change in exercise tolerance.  Lipid studies at that time revealed a total cholesterol 139, triglycerides 66, HDL 87 and LDL cholesterol was 39.  On his own, he underwent laboratory by life extension in  August 2019.  Chemistry was normal.  Fasting glucose was 83.  Total cholesterol 114, triglycerides 42, HDL 81, and LDL was 25.  Homocystine was normal.  Vitamin D level was 63.2.  Insulin level was normal at 5.7.  Apo lipoprotein B was excellent at 32.  Life line screening screening was done by his own accord which he typically gets done every several years.  This now has shown improvement from previous evaluations with carotids now being totally normal without previous plaque.  His abdominal aorta was normal, there was no PVD.  Remains active.  He plays golf 2 times per week at a minimum.  He admits to weight gain over the holidays.   When I  saw [name] [name] in the office in January 2020, he was remaining fairly stable.  However, over the past 6 months he has noticed some mild gradual increase in exertional shortness of breath.  He saw his primary physician, Dr. Catha Gosselin and due to his exertional component of dyspnea he referred [name] [name] back to our office and he saw Dr. Lethea Killings on July 10, 2019 for further evaluation.  An echo Doppler study on July 11, 2019 revealed EF at 60 to 65% with grade 1 diastolic dysfunction.  He was referred for a nuclear perfusion study which was done with a Lexiscan protocol and this showed clear change from his prior evaluations and was interpreted as high risk with transient ischemic dilatation, EF 39%, and ischemia in several distributions. There was significant discussion with Dr. Bufford Buttner and an attempt at scheduling [name] [name] for coronary CTA was unsuccessful.  After much discussion the decision was made to proceed with cardiac catheterization which initially the patient had some reservation.  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  Apparently his angiographic data was reviewed at [facility] and decision was not to proceed with intervention or bypass at that  time and he has been on medical therapy for over 30 years.    I saw [name] [name] for telemedicine evaluation on August 30, 2019 prior to undergoing his cardiac catheterization which was scheduled for the following day.  At that time, he told me that since he was  notified of his abnormal results on his own he increased aspirin from 81 mg up to 325 mg and he had old prescription of Plavix which had been prescribed by Dr. Alanda Amass approximately 8 years ago.  For the past several weeks he put himself back on his oral Plavix medication and he stated that he had checked on the Internet and felt that even though it may not be as potent as it may have been when initially prescribed to [name] [name] he felt that this provided some additional coverage until his catheterization.  Cardiac catheterization was performed by me on August 31 2019.  This revealed severe native CAD with 80% calcified distal left main stenosis, 99% ostial LAD stenosis with total occlusion of the LAD after the proximal diagonal and septal perforating artery, 80% ostial circumflex stenosis followed by 80% proximal stenosis with extensive collateralization to a dominant RCA from the circumflex as well as faint collaterals to the distal LAD and total proximal RCA occlusion.  He underwent urgent surgical consultation and on September 01, 2019 he underwent successful CABG revascularization with a LIMA to the LAD, vein to the diagonal 1 and OM 2 and PLV with endoscopic vein harvesting from the right lower extremity.  He received IV Lasix for fluid overload.  He was discharged on September 06, 2019.  During his hospitalization, hydrochlorothiazide, Plavix, and telmisartan were discontinued prior to discharge due to bradycardia and hypotension.  He was evaluated by Bailey Mech on September 18, 2019 and had URI symptoms and fever.  A chest x-ray did not reveal pneumonia and he tested negative for Covid, flu, and srep.  He has subsequently been evaluated by a physician  and his primary care's office and has undergone several courses of antibiotics including Augmentin and doxycycline, but most recently levofloxacin.  His fever has resolved and he is feeling better but he still admits to occasional cough.   I saw [name] [name] for his  initial post hospital evaluation with me on September 29, 2019.  He has not had any pulmonary evaluation.  From a cardiac perspective, he admits to residual swelling of his right lower extremity.  He had been on Lasix 40 mg daily but this was discontinued approximately 3 weeks ago after he had seen Dr. Cliffton Asters who performed his CABG revascularization surgery.  He continues to be on Praluent 150 mg every 2 weeks, rosuvastatin 40 mg and Zetia 10 mg for his hereditary hyperlipidemia.  He is unaware of palpitations.  He denies presyncope or syncope.  During that evaluation, he had tense lower extremity edema.  I recommended reinstitution of furosemide initially at 20 mg for the next 5 to 7 days then potentially as needed.  I change his metoprolol to succinate 25 mg daily.  He was on Praluent 150 mg, rosuvastatin and Zetia for familial hyperlipidemia.  I saw [name] [name] on September 12, 2020.  Since his prior evaluation he has remained stable. He had significant benefit from initial furosemide and now has been taking 20 mg 2 times per week.  However he still experiences mild right lower edema.  Since his CABG revascularization, he notes improvement in previous exertional dyspnea.  He had never experienced significant chest pressure.  He has been participating in hard strides cardiac rehab at [facility] which will be for 3 months duration.  He underwent repeat laboratory on January 01, 2020.  Total cholesterol is now 115, triglycerides 82, HDL 80, and LDL cholesterol 19 on Praluent, high potency statin and Zetia therapy.  He denies any palpitations.  He denies presyncope or syncope.    I last saw [name] [name] in a 2-year follow-up evaluation on September 09, 2022. Since I last saw  [name] [name], he has remained fairly stable.  He is working in [employer] in [employer].  He typically plays golf several times per week and goes to the gym 2 times per week doing both strength and balance.  At times he has noted some occasional leg swelling right greater than left.  He also admits to GERD.  He is now followed by Dr. Zachery Dauer at [place] for primary care.  He tells me his blood pressure oftentimes may be in the 140-150 range.  He continues to be on amlodipine at low-dose 2.5 mg, and has been taking as needed hydrochlorothiazide or furosemide.  He is on metoprolol succinate 25 mg daily and telmisartan 80 mg.  He continues to be on Praluent 150 mg/mL every 2 weeks, rosuvastatin 40 mg, and Zetia 10 mg.  During that evaluation, his blood pressure was elevated and I recommended he increase amlodipine to 5 mg from his dose of 2.5 mg and suggested he initiate HCTZ at 12.5 mg every other day depending upon leg swelling and if this continues to increase to daily.  Follow-up laboratory was recommended as well as a future echo Doppler assessment.  He underwent an echo Doppler study on Mar 10, 2023.  This revealed EF at 55 to 60% without wall motion abnormalities.  Strain was normal.  There was mild LVH.  He had normal pulmonary artery systolic pressure with estimated RV pressure 28 mm.  There was trivial MR.  He had mild to moderate PR.  There is mild dilation of his aortic root at 44 mm with dilation of ascending aorta at 42 mm.  Presently, [name] [name] remains asymptomatic without chest pain or shortness of breath.  Since Dr. Caryn Bee Little's retirement he has seen a anterior break  at [place] on 1 occasion.  He denies chest pain or shortness of breath.  He is unaware of palpitations.  He had had some reflux issues and had undergone endoscopy which was essentially normal.  He presents for evaluation.  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   left post shoulder-sup (CX35FU), left post leg    Coronary artery disease    Gout    Of the left third, fourth, and fifth MTPareas with swelling of left foot and pain   Hyperlipidemia    Hypertension     Past Surgical History:  Procedure Laterality Date   CARDIAC CATHETERIZATION  01/04/1986   CORONARY ARTERY BYPASS GRAFT N/A 09/01/2019   Procedure: CORONARY ARTERY BYPASS GRAFTING (CABG) x Four , using left internal mammary artery and right leg greater saphenous vein harvested endoscopically;  Surgeon: Corliss Skains, MD;  Location: [facility] OR;  Service: Open Heart Surgery;  Laterality: N/A;   LEFT HEART CATH AND CORONARY ANGIOGRAPHY N/A 08/31/2019   Procedure: LEFT HEART CATH AND CORONARY ANGIOGRAPHY;  Surgeon: Lennette Bihari, MD;  Location: [facility] INVASIVE CV LAB;  Service: Cardiovascular;  Laterality: N/A;   TEE WITHOUT CARDIOVERSION N/A 09/01/2019   Procedure: Transesophageal Echocardiogram (Tee);  Surgeon: Corliss Skains, MD;  Location: [facility] OR;  Service: Open Heart Surgery;  Laterality: N/A;    No Known Allergies  Current Outpatient Medications  Medication Sig Dispense Refill   amLODipine (NORVASC) 5 MG tablet Take 1 tablet (5 mg total) by mouth daily. 90 tablet 3   aspirin EC 81 MG tablet Take 81 mg by mouth daily.     Azelastine HCl 0.15 % SOLN Place 1 spray into the nose as needed.     Evolocumab (REPATHA SURECLICK) 140 MG/ML SOAJ Inject 140 mg into the skin every 14 (fourteen) days. 2 mL 11   ezetimibe (ZETIA) 10 MG tablet Take 1 tablet (10 mg total) by mouth daily. 90 tablet 4   fluticasone (FLONASE) 50 MCG/ACT nasal spray daily.     furosemide (LASIX) 20 MG tablet TAKE 1 TABLET BY MOUTH EVERY DAY 90 tablet 3   hydrochlorothiazide (MICROZIDE) 12.5 MG capsule TAKE 1 CAPSULE BY MOUTH EVERY OTHER DAY TO EVERY 3RD DAY DEPENDING ON SWELLING AND BLOOD PRESSURE 45 capsule 1   metoprolol succinate (TOPROL-XL) 25 MG 24 hr tablet TAKE 1  TABLET BY MOUTH EVERY DAY 90 tablet 3   Oxymetazoline HCl (NASAL SPRAY) 0.05 % SOLN Place 1 spray into the nose daily as needed (congestion).     rosuvastatin (CRESTOR) 40 MG tablet TAKE ONE TABLET BY MOUTH DAILY 90 tablet 3   telmisartan (MICARDIS) 80 MG tablet TAKE 1 TABLET BY MOUTH DAILY 90 tablet 3   alendronate (FOSAMAX) 70 MG tablet Take 70 mg by mouth once a week. (Patient not taking: Reported on 03/16/2023)     NONFORMULARY OR COMPOUNDED ITEM TOPICAL 2% CHOLESTEROL 2% LOVASTATIN CREAM  APPLY TO AFFECTED AREA BID (Patient not taking: Reported on 03/16/2023) 2 each 2   No current facility-administered medications for this visit.    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  He still goes to several board meetingS .  t Workhroughout the year he is married and has 2 children, ages 65 and 59.  He has 2 grandchildren.  There is no history of tobacco use.  He does not drink alcohol.  Family history is notable that his mother died at age 39 with old age.  Father died at age 23 with advanced atherosclerosis.  He has one brother who is status post coronary stenting.  ROS General: Negative; No fevers, chills, or night sweats HEENT: Negative; No changes in vision or hearing, sinus congestion, difficulty swallowing Pulmonary: Negative; No cough, wheezing, shortness of breath, hemoptysis Cardiovascular:  See HPI; No chest pain, presyncope, syncope, palpitations,  Hives he may note some trace ankle swelling GI: Negative; No nausea, vomiting, diarrhea, or abdominal pain GU: Negative; No dysuria, hematuria, or difficulty voiding Musculoskeletal: Negative; no myalgias, joint pain, or weakness Hematologic/Oncologic: Negative; no easy bruising, bleeding Neurologic: Positive for gout Endocrine: Negative; no heat/cold intolerance; no diabetes Neuro: Negative; no changes in balance, headaches Skin: Osteophytic for chronic rash  Psychiatric:  Negative; No behavioral problems, depression Sleep: Negative; No daytime sleepiness, hypersomnolence, bruxism, restless legs, hypnogagnic hallucinations Other comprehensive 14 point system review is negative   Physical Exam BP 114/68 (BP Location: Left Arm, Patient Position: Sitting, Cuff Size: Large)   Pulse 86   Ht 5\' 8"  (1.727 m)   Wt 215 lb 12.8 oz (97.9 kg)   SpO2 90%   BMI 32.81 kg/m    Repeat blood pressure by me was 110/68  Wt Readings from Last 3 Encounters:  03/16/23 215 lb 12.8 oz (97.9 kg)  09/09/22 220 lb (99.8 kg)  12/22/21 218 lb 9.6 oz (99.2 kg)   General: Alert, oriented, no distress.  Skin: normal turgor, no rashes, warm and dry HEENT: Normocephalic, atraumatic. Pupils equal round and reactive to light; sclera anicteric; extraocular muscles intact;  Nose without nasal septal hypertrophy Mouth/Parynx benign; Mallinpatti scale 3 Neck: No JVD, no carotid bruits; normal carotid upstroke Lungs: clear to ausculatation and percussion; no wheezing or rales Chest wall: without tenderness to palpitation Heart: PMI not displaced, RRR, s1 s2 normal, 1/6 systolic murmur, faint diastolic murmur, no rubs, gallops, thrills, or heaves Abdomen: soft, nontender; no hepatosplenomehaly, BS+; abdominal aorta nontender and not dilated by palpation. Back: no CVA tenderness Pulses 2+ Musculoskeletal: full range of motion, normal strength, no joint deformities Extremities: no clubbing cyanosis or edema, Homan's sign negative  Neurologic: grossly nonfocal; Cranial nerves grossly wnl Psychologic: Normal mood and affect    Mar 16, 2023 ECG (independently read by me): NSR at 86, IRBBB, T wave abnormality V1-3    September 09, 2022 ECG (independently read by me): NSR at 61, IRBBB1st degree block, PR 234 msec.  January 02, 2020 ECG (independently read by me): Normal sinus rhythm at 81 bpm.  No ectopy.  Normal intervals.  Mild RV conduction delay   September 29, 2019 ECG (independently  read by me): Normal sinus rhythm at 70 bpm.  Borderline first-degree AV block with a PR level 206 ms.  Nonspecific T wave abnormality in V1 and V2  January 2020 ECG (independently read by me): Sinus rhythm at 81 bpm.  Mild first-degree AV block with a PR of 206 ms.  Nonspecific T changes.  02/11/2018 ECG (independently read by me): Sinus rhythm at 60 bpm.  First-degree AV block.  No ectopy.  December 2018 ECG (independently read by me): Normal sinus rhythm at 72 bpm.  Early transition.  First-degree AV block with a PR interval of 206 ms.  October 2016 ECG (independently read by me): Normal sinus rhythm at 62.  No ectopy.  No ST segment changes.  ECG (independently read by me): Normal sinus  rhythm at 71 bpm.  No ectopy.  QTc interval 441 ms  LABS:    Latest Ref Rng & Units 09/09/2022   11:53 AM 10/08/2020   11:16 AM 01/01/2020   10:29 AM  BMP  Glucose 70 - 99 mg/dL 80  97  73   BUN 8 - 27 mg/dL 13  14  11    Creatinine 0.76 - 1.27 mg/dL 1.61  0.96  0.45   BUN/Creat Ratio 10 - 24 12  12  11    Sodium 134 - 144 mmol/L 143  141  141   Potassium 3.5 - 5.2 mmol/L 4.7  4.8  4.8   Chloride 96 - 106 mmol/L 100  102  102   CO2 20 - 29 mmol/L 24  28  24    Calcium 8.6 - 10.2 mg/dL 9.6  9.5  9.4       Latest Ref Rng & Units 09/09/2022   11:53 AM 10/08/2020   11:16 AM 01/01/2020   10:29 AM  Hepatic Function  Total Protein 6.0 - 8.5 g/dL 7.5  6.5  6.7   Albumin 3.8 - 4.8 g/dL 5.0  4.5  4.4   AST 0 - 40 IU/L 31  25  30    ALT 0 - 44 IU/L 23  18  21    Alk Phosphatase 44 - 121 IU/L 57  54  56   Total Bilirubin 0.0 - 1.2 mg/dL 0.5  0.8  0.3       Latest Ref Rng & Units 09/09/2022   11:53 AM 10/08/2020   11:16 AM 01/01/2020   10:29 AM  CBC  WBC 3.4 - 10.8 x10E3/uL 6.6  5.1  5.5   Hemoglobin 13.0 - 17.7 g/dL 40.9  81.1  91.4   Hematocrit 37.5 - 51.0 % 42.4  39.9  41.7   Platelets 150 - 450 x10E3/uL 177  142  162    Lab Results  Component Value Date   MCV 98 (H) 09/09/2022   MCV 100 (H)  10/08/2020   MCV 96 01/01/2020   Lab Results  Component Value Date   TSH 1.670 09/09/2022   Lipid Panel     Component Value Date/Time   CHOL 135 09/09/2022 1153   CHOL 153 12/20/2015 0949   TRIG 72 09/09/2022 1153   TRIG 45 12/20/2015 0949   TRIG 72 12/06/2014 0944   HDL 83 09/09/2022 1153   HDL 76 12/20/2015 0949   HDL 94 12/06/2014 0944   CHOLHDL 1.6 09/09/2022 1153   CHOLHDL 2.0 12/20/2015 0949   LDLCALC 38 09/09/2022 1153   LDLCALC 68 12/20/2015 0949   RADIOLOGY: No results found.  CARDIAC STUDIES: Ost RCA to Prox RCA lesion is 100% stenosed. Dist LM to Prox LAD lesion is 99% stenosed. Ost Cx lesion is 80% stenosed. Prox LAD to Mid LAD lesion is 100% stenosed. Mid LM lesion is 80% stenosed. Prox Cx lesion is 80% stenosed.   Severe  native coronary artery disease with 80% calcified distal left main stenosis; 99% ostial LAD stenosis with total occlusion of the LAD after the proximal diagonal and septal perforating artery; 80% ostial circumflex stenosis followed by 80% proximal stenosis with extensive collateralization to a dominant RCA from the circumflex vessel as well as faint collaterals to the distal LAD, and total proximal RCA occlusion.   LVEDP 14 mm   RECOMMENDATION:  Urgent Surgical consultation will be obtained for CABG revascularization.  A stat P2Y12 blood test was sent from the laboratory to assess the  antiplatelet effect of his self prescribed 8 years outdated Plavix with his last dose 2 days ago.    Intervention   IMPRESSION:  No diagnosis found.   ASSESSMENT AND PLAN: [name] [name] is a 77 year-old gentleman who has a history of heterozygous familial hyperlipidemia and was initially noted to have cholesterols in excess of 400 in the 1970s.  His father and brother also has similar history.  His 2 children do not have this disorder.  He has been on aggressive lipid-lowering therapy ever since statins were initially introduced and had  participated in the early clinical trials of lovastatin and simvastatin.  He had previously been demonstrated to have coronary atherosclerosis by cardiac catheterization in the 1980s.  In 2016 prior to undergoing hip surgery a four-year follow-up nuclear perfusion study continued to be low risk and did not reveal any significant perfusion abnormalities.  His familial hyperlipidemia has been treated with Praluent 150 mg every 14 days, rosuvastatin 40 mg, Zetia 10 mg in addition to omega-3 fatty acid.  When I had  seen [name] [name] I discontinued niacin.  Lipid studies were outstanding and most recent evaluation showed a total cholesterol 114 with an LDL cholesterol of 25.  Triglycerides were 42 with HDL 81.  Apo lipoprotein B was 32.  Over the late spring and early summer of 2020, [name] [name] developed progressive exertional dyspnea.  An echo Doppler study on September 1,2020 showed an EF at 60 to 65%.  A nuclear perfusion study was very high risk and EF post-rest was 39% and there was evidence for transient ischemic dilatation with ischemia in several vascular distributions.  Cardiac catheterization revealed life-threatening anatomy as noted above and the following day he underwent urgent coronary revascularization by Dr. Cliffton Asters successfully.  Following his surgery, he had had issues with fever increased cough and shortness of breath and had several courses of antibiotic therapy with improvement.   He tested negative for Covid.  When I evaluated [name] [name] for his initial post CABG evaluation he had significant tense lower extremity edema which significantly improved with Lasix significantly improved with institution of Lasix initially 20 mg daily.  I had last seen [name] [name] in November 2021.  Presently, he has done well and continues to be without recurrent chest pain or significant shortness of breath.  At times he notes occasional right lower extremity swelling.  He also has some issues with GERD for which he had taken Prilosec but  most recently Pepcid.  At his last evaluation with me in November 2023, blood pressure was elevated and amlodipine was titrated to 5 mg and he was started on 12.5 mg of HCTZ to take every other day for leg swelling.  Laboratory on September 09, 2022 showed total cholesterol 135, LDL cholesterol 38, triglycerides 72 and HDL 83.  His blood pressure today is excellent on his current regimen amlodipine 5 mg, furosemide 20 mg daily, telmisartan 80 mg, metoprolol succinate 25 mg and he takes HCTZ 12.5 mg as needed for swelling.  He continues to be on Repatha and rosuvastatin 40 mg for hyperlipidemia.  I reviewed his most recent echo which shows normal LV systolic and diastolic function with mild LVH.  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  Clinically he is asymptomatic without chest pain exertional dyspnea or palpitations.  He will be following up with Dr. Peri Maris at [place] who has replaced Dr.  Caryn Bee Little.  I will see [name] [name] in 6 months for reevaluation.      Lennette Bihari, MD, [facility] 03/16/2023 11:16 AM

## 2023-03-16 NOTE — Patient Instructions (Signed)
   Follow-Up: At [facility], you and your health needs are our priority.  As part of our continuing mission to provide you with exceptional heart care, we have created designated Provider Care Teams.  These Care Teams include your primary Cardiologist (physician) and Advanced Practice Providers (APPs -  Physician Assistants and Nurse Practitioners) who all work together to provide you with the care you need, when you need it.  We recommend signing up for the patient portal called "MyChart".  Sign up information is provided on this After Visit Summary.  MyChart is used to connect with patients for Virtual Visits (Telemedicine).  Patients are able to view lab/test results, encounter notes, upcoming appointments, etc.  Non-urgent messages can be sent to your provider as well.   To learn more about what you can do with MyChart, go to [email or web address].    Your next appointment:   6 month(s)  Provider:   Thomas Kelly, MD      

## 2023-03-18 ENCOUNTER — Encounter: Payer: Self-pay | Admitting: Cardiovascular Disease

## 2023-04-20 DIAGNOSIS — H2513 Age-related nuclear cataract, bilateral: Secondary | ICD-10-CM | POA: Diagnosis not present

## 2023-04-20 DIAGNOSIS — Z86010 Personal history of colonic polyps: Secondary | ICD-10-CM | POA: Diagnosis not present

## 2023-04-20 DIAGNOSIS — R1013 Epigastric pain: Secondary | ICD-10-CM | POA: Diagnosis not present

## 2023-04-20 DIAGNOSIS — H524 Presbyopia: Secondary | ICD-10-CM | POA: Diagnosis not present

## 2023-05-19 ENCOUNTER — Other Ambulatory Visit: Payer: Self-pay | Admitting: Gastroenterology

## 2023-05-19 DIAGNOSIS — R109 Unspecified abdominal pain: Secondary | ICD-10-CM

## 2023-05-19 DIAGNOSIS — R110 Nausea: Secondary | ICD-10-CM

## 2023-06-10 ENCOUNTER — Ambulatory Visit
Admission: RE | Admit: 2023-06-10 | Discharge: 2023-06-10 | Disposition: A | Discharge status: Home / Self Care | Payer: Medicare HMO | Source: Ambulatory Visit | Attending: Gastroenterology | Admitting: Gastroenterology

## 2023-06-10 DIAGNOSIS — R110 Nausea: Secondary | ICD-10-CM

## 2023-06-10 DIAGNOSIS — R109 Unspecified abdominal pain: Secondary | ICD-10-CM

## 2023-06-10 DIAGNOSIS — K5730 Diverticulosis of large intestine without perforation or abscess without bleeding: Secondary | ICD-10-CM | POA: Diagnosis not present

## 2023-06-10 DIAGNOSIS — R1110 Vomiting, unspecified: Secondary | ICD-10-CM | POA: Diagnosis not present

## 2023-06-10 MED ORDER — IOPAMIDOL (ISOVUE-300) INJECTION 61%
100.0000 mL | Freq: Once | INTRAVENOUS | Status: AC | PRN
  Administered 2023-06-10: 100 mL via INTRAVENOUS

## 2023-07-16 DIAGNOSIS — Z1389 Encounter for screening for other disorder: Secondary | ICD-10-CM | POA: Diagnosis not present

## 2023-07-16 DIAGNOSIS — Z0000 Encounter for general adult medical examination without abnormal findings: Secondary | ICD-10-CM | POA: Diagnosis not present

## 2023-07-23 DIAGNOSIS — Z79899 Other long term (current) drug therapy: Secondary | ICD-10-CM | POA: Diagnosis not present

## 2023-07-23 DIAGNOSIS — I25110 Atherosclerotic heart disease of native coronary artery with unstable angina pectoris: Secondary | ICD-10-CM | POA: Diagnosis not present

## 2023-07-23 DIAGNOSIS — E7849 Other hyperlipidemia: Secondary | ICD-10-CM | POA: Diagnosis not present

## 2023-08-12 ENCOUNTER — Other Ambulatory Visit: Payer: Self-pay | Admitting: Gastroenterology

## 2023-08-12 DIAGNOSIS — R1013 Epigastric pain: Secondary | ICD-10-CM | POA: Diagnosis not present

## 2023-08-12 DIAGNOSIS — Z860100 Personal history of colon polyps, unspecified: Secondary | ICD-10-CM | POA: Diagnosis not present

## 2023-08-17 DIAGNOSIS — I700 Atherosclerosis of aorta: Secondary | ICD-10-CM | POA: Diagnosis not present

## 2023-08-17 DIAGNOSIS — M109 Gout, unspecified: Secondary | ICD-10-CM | POA: Diagnosis not present

## 2023-08-17 DIAGNOSIS — N182 Chronic kidney disease, stage 2 (mild): Secondary | ICD-10-CM | POA: Diagnosis not present

## 2023-08-17 DIAGNOSIS — Z125 Encounter for screening for malignant neoplasm of prostate: Secondary | ICD-10-CM | POA: Diagnosis not present

## 2023-08-17 DIAGNOSIS — E785 Hyperlipidemia, unspecified: Secondary | ICD-10-CM | POA: Diagnosis not present

## 2023-08-17 DIAGNOSIS — I2510 Atherosclerotic heart disease of native coronary artery without angina pectoris: Secondary | ICD-10-CM | POA: Diagnosis not present

## 2023-08-17 DIAGNOSIS — E559 Vitamin D deficiency, unspecified: Secondary | ICD-10-CM | POA: Diagnosis not present

## 2023-08-17 DIAGNOSIS — R112 Nausea with vomiting, unspecified: Secondary | ICD-10-CM | POA: Diagnosis not present

## 2023-08-17 DIAGNOSIS — I10 Essential (primary) hypertension: Secondary | ICD-10-CM | POA: Diagnosis not present

## 2023-08-18 ENCOUNTER — Inpatient Hospital Stay
Admission: RE | Admit: 2023-08-18 | Discharge: 2023-08-18 | Discharge status: Home / Self Care | Payer: Medicare HMO | Source: Ambulatory Visit | Attending: Gastroenterology | Admitting: Gastroenterology

## 2023-08-18 DIAGNOSIS — R1013 Epigastric pain: Secondary | ICD-10-CM

## 2023-09-10 ENCOUNTER — Other Ambulatory Visit: Payer: Self-pay | Admitting: Cardiovascular Disease

## 2023-09-21 ENCOUNTER — Encounter: Payer: Self-pay | Admitting: Cardiovascular Disease

## 2023-09-21 ENCOUNTER — Ambulatory Visit: Payer: Medicare HMO | Attending: Cardiovascular Disease | Admitting: Cardiovascular Disease

## 2023-09-21 VITALS — BP 132/90 | HR 69 | Ht 68.0 in | Wt 217.4 lb

## 2023-09-21 DIAGNOSIS — R600 Localized edema: Secondary | ICD-10-CM

## 2023-09-21 DIAGNOSIS — I440 Atrioventricular block, first degree: Secondary | ICD-10-CM

## 2023-09-21 DIAGNOSIS — I25110 Atherosclerotic heart disease of native coronary artery with unstable angina pectoris: Secondary | ICD-10-CM

## 2023-09-21 DIAGNOSIS — I10 Essential (primary) hypertension: Secondary | ICD-10-CM

## 2023-09-21 DIAGNOSIS — E7849 Other hyperlipidemia: Secondary | ICD-10-CM | POA: Diagnosis not present

## 2023-09-21 DIAGNOSIS — Z951 Presence of aortocoronary bypass graft: Secondary | ICD-10-CM | POA: Diagnosis not present

## 2023-09-21 DIAGNOSIS — E669 Obesity, unspecified: Secondary | ICD-10-CM

## 2023-09-21 NOTE — Patient Instructions (Addendum)
All while that does look like Jasmine yeah medication Instructions:  IF YOUR SYSTOLIC PRESSURE IS OVER 135 (TOP NUMBER), RESUME THE AMLODIPINE. TAKE ONE-HALF TABLET ONLY *If you need a refill on your cardiac medications before your next appointment, please call your pharmacy*   Lab Work: NO LABS ORDERED If you have labs (blood work) drawn today and your tests are completely normal, you will receive your results only by: MyChart Message (if you have MyChart) OR A paper copy in the mail If you have any lab test that is abnormal or we need to change your treatment, we will call you to review the results.   Testing/Procedures: NO TESTING   Follow-Up: At [facility], you and your health needs are our priority.  As part of our continuing mission to provide you with exceptional heart care, we have created designated Provider Care Teams.  These Care Teams include your primary Cardiologist (physician) and Advanced Practice Providers (APPs -  Physician Assistants and Nurse Practitioners) who all work together to provide you with the care you need, when you need it.  We recommend signing up for the patient portal called "MyChart".  Sign up information is provided on this After Visit Summary.  MyChart is used to connect with patients for Virtual Visits (Telemedicine).  Patients are able to view lab/test results, encounter notes, upcoming appointments, etc.  Non-urgent messages can be sent to your provider as well.   To learn more about what you can do with MyChart, go to [email or web address].    Your next appointment:   6 month(s)  Provider:   Nicki Guadalajara, MD

## 2023-09-21 NOTE — Progress Notes (Signed)
Patient ID: [name] [name], male   DOB: 08/11/1946, 77 y.o.   MRN: [id number]       HPI:  [name] [name] is a 77 y.o. male who is a former patient of Dr. Alanda Amass.  He presents for 6 month follow-up evaluation.  Mr. [name] [name] has a long-standing history of familial hyperlipidemia.  His father and one of his brothers had familial hyperlipidemia.   His father died at age 34 secondary to advanced coronary artery disease.  Mr. [name] [name] cholesterols in the past had been as high as 400.  He was initially diagnosed in the 1970s.  He has participated in numerous drug studies over the years and was one of the precipitants in the initial statin trials with Mevacor.  As part of one of his studies, he underwent diagnostic cardiac catheterization in [place].  He was asymptomatic with chest pain but was found to have mild coronary obstructive disease with 20-40% RCA stenoses, 20-30% circumflex stenoses,  At least moderate LAD stenosis with poststenotic dilatation involving the diagonal and septal perforating artery in 1987.  He has not been on medical therapy for CAD, with the exception of aggressive lipid-lowering treatment.  His last nuclear perfusion study was in 2012, which continue to show normal perfusion.  There was attenuation artifact.  Post-rest ejection fraction was 53%. In June 2014 an echo Doppler study  showed an ejection fraction of 55-60%; Normal diastolic parameters.  He had mild left atrial dilatation.  There was mild aortic valve sclerosis without stenosis.  The patient tells me he has been aggressive with his follow-up.  He typically has laboratory checked via life extension on his own. Remotely, he also underwent VAP testing to determine particles size and numbers.  H e has been on Zetia 10 mg Crestor 40 mg, Niaspan 2000 mg and 2000 mg of omega-3 fatty acids.  I did review blood work that he had had in April 2015 from life extension and at that time on aggressive medical therapy.  His total  cholesterol was 185, triglycerides 59, HDL cholesterol 82, LDL cholesterol 91.  Homocysteine was 11.7.  Hemoglobin A1c was 5.7.  Thyroid function studies were normal.  He was not anemic.  He had normal renal function.  He participated in this study looking at possible side effects from the injection site for PCSK9 inhibitor therapy.  He tells me his brother  underwent stenting of his coronary artery age 75.  The patient also has a history of gout, and hypertension.  He has been tolerating my Micardis 80 mg without side effects.  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  He denies chest pain.  He denies PND, orthopnea.  He denies palpitations.  In February 2016 he had an NMR LipoProfile.  Total cholesterol was 185, triglycerides 72, HDL 94, LDL C 77, and LDL particle number was excellent at 683.  Insulin resistance score was 31.  In March 2016  a nuclear perfusion study continued  to show normal perfusion and was low risk with probable bowel attenuation artifact.  As part of his own blood testing he had another lipid panel in August 2016 which showed a total cholesterol 140, HDL 50, LDL 77, and very normal triglyceride levels.  He had purposeful weight loss of 30 pounds.  He tells me he had undergone hip surgery by Dr. Glorious Peach in [place], [place].  He underwent an NMR LipoProfile on 12/20/2015.  Total cholesterol is 153, HDL 76, triglycerides 45, and calculated LDL was 68.  He had 1241, LDL particles which were predominantly large.  There were only 196 small LDL particles.  April lipoprotein B was normal at 61 as was LPa at 14.  He again had his labs rechecked as part of life extension, blood testing in June 2017.  He has a remote history of gout and has been taking a you.  All and uric acid was 3.3.  Renal function was normal.  He had normal LFTs.  Lipid studies revealed a total cholesterol of 167, triglycerides 46, HDL 85, and LDL 73.  Homocysteine was normal at 9.5.  PSA was 0.8.   Testosterone was low at 297.  Hemoglobin A1c was excellent at 5.3.  TSH was normal.  He was not anemic with hemoglobin of 13.5, hematocrit of 42.3.  MCV was minimally increased at 102.  Vitamin D level was normal at 95.7.  He had been taking vitamin D 10,000 units daily supplementation  Since I last saw him one year ago, he has remained active.  Over the past year he remains active.  He plays golf at least 2 days per week.  days a week.  He walks several other days.  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  He is widowed and not sexually active.   He underwent follow-up laboratory on 04/14/2017 at life extension.  BUN 16, creatinine 0.96.  Total cholesterol 189, triglycerides 71, HDL 89, LDL 86.  Homocysteine 14.5.  C-reactive protein 2.1.  Hemoglobin 14.1/hematocrit 42.8.  Serum testosterone 196.  TSH 2.6.  Vitamin D 50.2.  Apo lipoprotein B 81.    When I  saw him in December 2018, I had a long discussion with reference to PCSK 9 inhibition in light of his familial hyperlipidemia.  He has since been started on Praluent 150 mg every 2-week injections and had received 2 months of treatment.  Follow-up laboratory 2 days ago has shown a total cholesterol of 139, triglycerides 66, HDL 87, and LDL 39.  He has not had any side  effects to the medication.  His insurance company had denied Repatha.  He continues to be on concomitant therapy with Zetia 10 mg and rosuvastatin 40 mg.  He also is on Toprol-XL 25 mg and telmisartan 80 mg for hypertension.  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  He recently returned from a two-week trip to [place] to visit his son who lives there.    I saw him in April 2019.  He continued to be asymptomatic with reference to chest pain, and did not have any change in exercise tolerance.  Lipid studies at that time revealed a total cholesterol 139, triglycerides 66, HDL 87 and LDL cholesterol was 39.  On his own, he underwent laboratory by life extension in  August 2019.  Chemistry was normal.  Fasting glucose was 83.  Total cholesterol 114, triglycerides 42, HDL 81, and LDL was 25.  Homocystine was normal.  Vitamin D level was 63.2.  Insulin level was normal at 5.7.  Apo lipoprotein B was excellent at 32.  Life line screening screening was done by his own accord which he typically gets done every several years.  This now has shown improvement from previous evaluations with carotids now being totally normal without previous plaque.  His abdominal aorta was normal, there was no PVD.  Remains active.  He plays golf 2 times per week at a minimum.  He admits to weight gain over the holidays.   When I  saw him in the office in January 2020, he was remaining fairly stable.  However, over the past 6 months he has noticed some mild gradual increase in exertional shortness of breath.  He saw his primary physician, Dr. Catha Gosselin and due to his exertional component of dyspnea he referred him back to our office and he saw Dr. Lethea Killings on July 10, 2019 for further evaluation.  An echo Doppler study on July 11, 2019 revealed EF at 60 to 65% with grade 1 diastolic dysfunction.  He was referred for a nuclear perfusion study which was done with a Lexiscan protocol and this showed clear change from his prior evaluations and was interpreted as high risk with transient ischemic dilatation, EF 39%, and ischemia in several distributions. There was significant discussion with Dr. Bufford Buttner and an attempt at scheduling him for coronary CTA was unsuccessful.  After much discussion the decision was made to proceed with cardiac catheterization which initially the patient had some reservation.  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  Apparently his angiographic data was reviewed at [facility] and decision was not to proceed with intervention or bypass at that  time and he has been on medical therapy for over 30 years.    I saw him for telemedicine evaluation on August 30, 2019 prior to undergoing his cardiac catheterization which was scheduled for the following day.  At that time, he told me that since he was  notified of his abnormal results on his own he increased aspirin from 81 mg up to 325 mg and he had old prescription of Plavix which had been prescribed by Dr. Alanda Amass approximately 8 years ago.  For the past several weeks he put himself back on his oral Plavix medication and he stated that he had checked on the Internet and felt that even though it may not be as potent as it may have been when initially prescribed to him he felt that this provided some additional coverage until his catheterization.  Cardiac catheterization was performed by me on August 31 2019.  This revealed severe native CAD with 80% calcified distal left main stenosis, 99% ostial LAD stenosis with total occlusion of the LAD after the proximal diagonal and septal perforating artery, 80% ostial circumflex stenosis followed by 80% proximal stenosis with extensive collateralization to a dominant RCA from the circumflex as well as faint collaterals to the distal LAD and total proximal RCA occlusion.  He underwent urgent surgical consultation and on September 01, 2019 he underwent successful CABG revascularization with a LIMA to the LAD, vein to the diagonal 1 and OM 2 and PLV with endoscopic vein harvesting from the right lower extremity.  He received IV Lasix for fluid overload.  He was discharged on September 06, 2019.  During his hospitalization, hydrochlorothiazide, Plavix, and telmisartan were discontinued prior to discharge due to bradycardia and hypotension.  He was evaluated by Bailey Mech on September 18, 2019 and had URI symptoms and fever.  A chest x-ray did not reveal pneumonia and he tested negative for Covid, flu, and srep.  He has subsequently been evaluated by a physician  and his primary care's office and has undergone several courses of antibiotics including Augmentin and doxycycline, but most recently levofloxacin.  His fever has resolved and he is feeling better but he still admits to occasional cough.   I saw him for his  initial post hospital evaluation with me on September 29, 2019.  He has not had any pulmonary evaluation.  From a cardiac perspective, he admits to residual swelling of his right lower extremity.  He had been on Lasix 40 mg daily but this was discontinued approximately 3 weeks ago after he had seen Dr. Cliffton Asters who performed his CABG revascularization surgery.  He continues to be on Praluent 150 mg every 2 weeks, rosuvastatin 40 mg and Zetia 10 mg for his hereditary hyperlipidemia.  He is unaware of palpitations.  He denies presyncope or syncope.  During that evaluation, he had tense lower extremity edema.  I recommended reinstitution of furosemide initially at 20 mg for the next 5 to 7 days then potentially as needed.  I change his metoprolol to succinate 25 mg daily.  He was on Praluent 150 mg, rosuvastatin and Zetia for familial hyperlipidemia.  I saw him on September 12, 2020.  Since his prior evaluation he has remained stable. He had significant benefit from initial furosemide and now has been taking 20 mg 2 times per week.  However he still experiences mild right lower edema.  Since his CABG revascularization, he notes improvement in previous exertional dyspnea.  He had never experienced significant chest pressure.  He has been participating in hard strides cardiac rehab at [facility] which will be for 3 months duration.  He underwent repeat laboratory on January 01, 2020.  Total cholesterol is now 115, triglycerides 82, HDL 80, and LDL cholesterol 19 on Praluent, high potency statin and Zetia therapy.  He denies any palpitations.  He denies presyncope or syncope.    I last saw him in a 2-year follow-up evaluation on September 09, 2022. Since I last saw  him, he has remained fairly stable.  He is working in [employer] in [employer].  He typically plays golf several times per week and goes to the gym 2 times per week doing both strength and balance.  At times he has noted some occasional leg swelling right greater than left.  He also admits to GERD.  He is now followed by Dr. Zachery Dauer at [facility] for primary care.  He tells me his blood pressure oftentimes may be in the 140-150 range.  He continues to be on amlodipine at low-dose 2.5 mg, and has been taking as needed hydrochlorothiazide or furosemide.  He is on metoprolol succinate 25 mg daily and telmisartan 80 mg.  He continues to be on Praluent 150 mg/mL every 2 weeks, rosuvastatin 40 mg, and Zetia 10 mg.  During that evaluation, his blood pressure was elevated and I recommended he increase amlodipine to 5 mg from his dose of 2.5 mg and suggested he initiate HCTZ at 12.5 mg every other day depending upon leg swelling and if this continues to increase to daily.  Follow-up laboratory was recommended as well as a future echo Doppler assessment.  He underwent an echo Doppler study on Mar 10, 2023.  This revealed EF at 55 to 60% without wall motion abnormalities.  Strain was normal.  There was mild LVH.  He had normal pulmonary artery systolic pressure with estimated RV pressure 28 mm.  There was trivial MR.  He had mild to moderate PR.  There is mild dilation of his aortic root at 44 mm with dilation of ascending aorta at 42 mm.  When I last saw him on Mar 16, 2023 Mr. O'Meara remained asymptomatic without chest pain or shortness of breath.  Since Dr. Caryn Bee  Little's retirement he has seen an Psychologist, clinical with on 1 occasion.  He denies chest pain or shortness of breath.  He is unaware of palpitations.  He had had some reflux issues and had undergone endoscopy which was essentially normal.  He presents for evaluation.  Since I last saw him, Mr. Dutch Gray denies any chest pain or shortness of breath.   Approximately 2 months ago he was experiencing some mild dizziness and stopped his amlodipine.  At times he notices some trace right ankle edema.  He takes hydrochlorothiazide 12.5 mg on an as needed basis.  He is on with metoprolol succinate 25 mg daily, telmisartan 80 mg daily, and continues to be on Zetia 10 mg, rosuvastatin 40 mg, and Repatha 140 mg every 2 weeks.  He presents for follow-up evaluation.  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   left post shoulder-sup (CX35FU), left post leg    Coronary artery disease    Gout    Of the left third, fourth, and fifth MTPareas with swelling of left foot and pain   Hyperlipidemia    Hypertension     Past Surgical History:  Procedure Laterality Date   CARDIAC CATHETERIZATION  01/04/1986   CORONARY ARTERY BYPASS GRAFT N/A 09/01/2019   Procedure: CORONARY ARTERY BYPASS GRAFTING (CABG) x Four , using left internal mammary artery and right leg greater saphenous vein harvested endoscopically;  Surgeon: Corliss Skains, MD;  Location: [facility] OR;  Service: Open Heart Surgery;  Laterality: N/A;   LEFT HEART CATH AND CORONARY ANGIOGRAPHY N/A 08/31/2019   Procedure: LEFT HEART CATH AND CORONARY ANGIOGRAPHY;  Surgeon: Lennette Bihari, MD;  Location: [facility] INVASIVE CV LAB;  Service: Cardiovascular;  Laterality: N/A;   TEE WITHOUT CARDIOVERSION N/A 09/01/2019   Procedure: Transesophageal Echocardiogram (Tee);  Surgeon: Corliss Skains, MD;  Location: [facility] OR;  Service: Open Heart Surgery;  Laterality: N/A;    No Known Allergies  Current Outpatient Medications  Medication Sig Dispense Refill   aspirin EC 81 MG tablet Take 81 mg by mouth daily.     Azelastine HCl 0.15 % SOLN Place 1 spray into the nose as needed.     Evolocumab (REPATHA SURECLICK) 140 MG/ML SOAJ Inject 140 mg into the skin every 14 (fourteen) days. 2 mL 11   ezetimibe (ZETIA)  10 MG tablet Take 1 tablet (10 mg total) by mouth daily. 90 tablet 4   fluticasone (FLONASE) 50 MCG/ACT nasal spray daily.     hydrochlorothiazide (MICROZIDE) 12.5 MG capsule TAKE 1 CAPSULE BY MOUTH EVERY OTHER DAY TO EVERY THIRD DAY DEPENDING ON SWELLING AND BLOOD PRESSURE 45 capsule 1   metoprolol succinate (TOPROL-XL) 25 MG 24 hr tablet TAKE 1 TABLET BY MOUTH EVERY DAY 90 tablet 3   Oxymetazoline HCl (NASAL SPRAY) 0.05 % SOLN Place 1 spray into the nose daily as needed (congestion).     rosuvastatin (CRESTOR) 40 MG tablet TAKE ONE TABLET BY MOUTH DAILY 90 tablet 3   telmisartan (MICARDIS) 80 MG tablet TAKE 1 TABLET BY MOUTH DAILY 90 tablet 3   alendronate (FOSAMAX) 70 MG tablet Take 70 mg by mouth once a week. (Patient not taking: Reported on 03/16/2023)     amLODipine (NORVASC) 5 MG tablet TAKE 1 TABLET BY MOUTH DAILY (Patient not taking: Reported on 09/21/2023) 90 tablet 3   No current facility-administered medications for this visit.  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  He still goes to several board meetingS .  t Workhroughout the year he is married and has 2 children, ages 7 and 28.  He has 2 grandchildren.  There is no history of tobacco use.  He does not drink alcohol.  Family history is notable that his mother died at age 78 with old age.  Father died at age 33 with advanced atherosclerosis.  He has one brother who is status post coronary stenting.  ROS General: Negative; No fevers, chills, or night sweats HEENT: Negative; No changes in vision or hearing, sinus congestion, difficulty swallowing Pulmonary: Negative; No cough, wheezing, shortness of breath, hemoptysis Cardiovascular:  See HPI; No chest pain, presyncope, syncope, palpitations,  Hives he may note some trace ankle swelling GI: Negative; No nausea, vomiting, diarrhea, or abdominal pain GU: Negative; No dysuria, hematuria, or difficulty  voiding Musculoskeletal: Negative; no myalgias, joint pain, or weakness Hematologic/Oncologic: Negative; no easy bruising, bleeding Neurologic: Positive for gout Endocrine: Negative; no heat/cold intolerance; no diabetes Neuro: Negative; no changes in balance, headaches Skin: Osteophytic for chronic rash  Psychiatric: Negative; No behavioral problems, depression Sleep: Negative; No daytime sleepiness, hypersomnolence, bruxism, restless legs, hypnogagnic hallucinations Other comprehensive 14 point system review is negative   Physical Exam BP (!) 132/90 (BP Location: Left Arm, Patient Position: Sitting, Cuff Size: Large)   Pulse 69   Ht 5\' 8"  (1.727 m)   Wt 217 lb 6.4 oz (98.6 kg)   SpO2 93%   BMI 33.06 kg/m    Repeat blood pressure by me was 116/74 supine and 116/78 standing  Wt Readings from Last 3 Encounters:  09/21/23 217 lb 6.4 oz (98.6 kg)  03/16/23 215 lb 12.8 oz (97.9 kg)  09/09/22 220 lb (99.8 kg)   General: Alert, oriented, no distress.  Skin: normal turgor, no rashes, warm and dry HEENT: Normocephalic, atraumatic. Pupils equal round and reactive to light; sclera anicteric; extraocular muscles intact;  Nose without nasal septal hypertrophy Mouth/Parynx benign; Mallinpatti scale 3 Neck: No JVD, no carotid bruits; normal carotid upstroke Lungs: clear to ausculatation and percussion; no wheezing or rales Chest wall: without tenderness to palpitation Heart: PMI not displaced, RRR, s1 s2 normal, 1/6 systolic murmur, no diastolic murmur, no rubs, gallops, thrills, or heaves Abdomen: soft, nontender; no hepatosplenomehaly, BS+; abdominal aorta nontender and not dilated by palpation. Back: no CVA tenderness Pulses 2+ Musculoskeletal: full range of motion, normal strength, no joint deformities Extremities: no clubbing cyanosis or edema, Homan's sign negative  Neurologic: grossly nonfocal; Cranial nerves grossly wnl Psychologic: Normal mood and affect    ECG (independently  read by me):EKG Interpretation Date/Time:  Tuesday September 21 2023 10:02:03 EST Ventricular Rate:  69 PR Interval:  224 QRS Duration:  96 QT Interval:  414 QTC Calculation: 443 R Axis:   -28  Text Interpretation: Sinus rhythm with 1st degree A-V block Low voltage QRS Incomplete right bundle branch block When compared with ECG of 02-Sep-2019 06:55, ST no longer elevated in Lateral leads Nonspecific T wave abnormality, worse in Lateral leads Confirmed by Nicki Guadalajara ([id number]) on 09/21/2023 10:23:38 AM     Mar 16, 2023 ECG (independently read by me): NSR at 86, IRBBB, T wave abnormality V1-3    September 09, 2022 ECG (independently read by me): NSR at 61, IRBBB1st degree block, PR 234 msec.  January 02, 2020 ECG (independently read by me): Normal sinus rhythm at 81 bpm.  No ectopy.  Normal intervals.  Mild RV conduction delay   September 29, 2019 ECG (independently read by me): Normal sinus rhythm at 70 bpm.  Borderline first-degree AV block with a PR level 206 ms.  Nonspecific T wave abnormality in V1 and V2  January 2020 ECG (independently read by me): Sinus rhythm at 81 bpm.  Mild first-degree AV block with a PR of 206 ms.  Nonspecific T changes.  02/11/2018 ECG (independently read by me): Sinus rhythm at 60 bpm.  First-degree AV block.  No ectopy.  December 2018 ECG (independently read by me): Normal sinus rhythm at 72 bpm.  Early transition.  First-degree AV block with a PR interval of 206 ms.  October 2016 ECG (independently read by me): Normal sinus rhythm at 62.  No ectopy.  No ST segment changes.  ECG (independently read by me): Normal sinus rhythm at 71 bpm.  No ectopy.  QTc interval 441 ms  LABS:    Latest Ref Rng & Units 07/23/2023    9:18 AM 09/09/2022   11:53 AM 10/08/2020   11:16 AM  BMP  Glucose 70 - 99 mg/dL 93  80  97   BUN 8 - 27 mg/dL 12  13  14    Creatinine 0.76 - 1.27 mg/dL 4.01  0.27  2.53   BUN/Creat Ratio 10 - 24 10  12  12    Sodium 134 - 144 mmol/L 139  143   141   Potassium 3.5 - 5.2 mmol/L 4.9  4.7  4.8   Chloride 96 - 106 mmol/L 99  100  102   CO2 20 - 29 mmol/L 27  24  28    Calcium 8.6 - 10.2 mg/dL 9.5  9.6  9.5       Latest Ref Rng & Units 07/23/2023    9:18 AM 09/09/2022   11:53 AM 10/08/2020   11:16 AM  Hepatic Function  Total Protein 6.0 - 8.5 g/dL 6.7  7.5  6.5   Albumin 3.8 - 4.8 g/dL 4.6  5.0  4.5   AST 0 - 40 IU/L 26  31  25    ALT 0 - 44 IU/L 18  23  18    Alk Phosphatase 44 - 121 IU/L 62  57  54   Total Bilirubin 0.0 - 1.2 mg/dL 0.6  0.5  0.8       Latest Ref Rng & Units 07/23/2023    9:18 AM 09/09/2022   11:53 AM 10/08/2020   11:16 AM  CBC  WBC 3.4 - 10.8 x10E3/uL 8.5  6.6  5.1   Hemoglobin 13.0 - 17.7 g/dL 66.4  40.3  47.4   Hematocrit 37.5 - 51.0 % 41.2  42.4  39.9   Platelets 150 - 450 x10E3/uL 189  177  142    Lab Results  Component Value Date   MCV 100 (H) 07/23/2023   MCV 98 (H) 09/09/2022   MCV 100 (H) 10/08/2020   Lab Results  Component Value Date   TSH 2.950 07/23/2023   Lipid Panel     Component Value Date/Time   CHOL 135 07/23/2023 0918   CHOL 153 12/20/2015 0949   TRIG 71 07/23/2023 0918   TRIG 45 12/20/2015 0949   TRIG 72 12/06/2014 0944   HDL 84 07/23/2023 0918   HDL 76 12/20/2015 0949   HDL 94 12/06/2014 0944   CHOLHDL 1.6 07/23/2023 0918   CHOLHDL 2.0 12/20/2015 0949   LDLCALC 37 07/23/2023 0918   LDLCALC 68 12/20/2015  5784   RADIOLOGY: No results found.  CARDIAC STUDIES: Ost RCA to Prox RCA lesion is 100% stenosed. Dist LM to Prox LAD lesion is 99% stenosed. Ost Cx lesion is 80% stenosed. Prox LAD to Mid LAD lesion is 100% stenosed. Mid LM lesion is 80% stenosed. Prox Cx lesion is 80% stenosed.   Severe  native coronary artery disease with 80% calcified distal left main stenosis; 99% ostial LAD stenosis with total occlusion of the LAD after the proximal diagonal and septal perforating artery; 80% ostial circumflex stenosis followed by 80% proximal stenosis with extensive  collateralization to a dominant RCA from the circumflex vessel as well as faint collaterals to the distal LAD, and total proximal RCA occlusion.   LVEDP 14 mm   RECOMMENDATION:  Urgent Surgical consultation will be obtained for CABG revascularization.  A stat P2Y12 blood test was sent from the laboratory to assess the antiplatelet effect of his self prescribed 8 years outdated Plavix with his last dose 2 days ago.      IMPRESSION:  1. Coronary artery disease involving native coronary artery of native heart with unstable angina pectoris (HCC)   2. S/P CABG x 4   3. Essential hypertension   4. Familial hyperlipidemia   5. Lower extremity edema   6. First degree heart block   7. Mild obesity      ASSESSMENT AND PLAN: Mr. Himanshu Chrestman is a 77 year-old gentleman who has a history of heterozygous familial hyperlipidemia and was initially noted to have cholesterols in excess of 400 in the 1970s.  His father and brother also has similar history.  His 2 children do not have this disorder.  He has been on aggressive lipid-lowering therapy ever since statins were initially introduced and had participated in the early clinical trials of lovastatin and simvastatin.  He had previously been demonstrated to have coronary atherosclerosis by cardiac catheterization in the 1980s.  In 2016 prior to undergoing hip surgery a four-year follow-up nuclear perfusion study continued to be low risk and did not reveal any significant perfusion abnormalities.  His familial hyperlipidemia had been treated with Praluent 150 mg every 14 days, rosuvastatin 40 mg, Zetia 10 mg in addition to omega-3 fatty acid.  When I had  seen him I discontinued niacin.  Lipid studies were outstanding and most recent evaluation showed a total cholesterol 114 with an LDL cholesterol of 25.  Triglycerides were 42 with HDL 81.  Apo lipoprotein B was 32.  Over the late spring and early summer of 2020, Mr. O'Meara developed progressive  exertional dyspnea.  An echo Doppler study on September 1,2020 showed an EF at 60 to 65%.  A nuclear perfusion study was very high risk and EF post-rest was 39% and there was evidence for transient ischemic dilatation with ischemia in several vascular distributions.  Cardiac catheterization revealed life-threatening anatomy as noted above and the following day he underwent urgent coronary revascularization by Dr. Cliffton Asters successfully.  Following his surgery, he had had issues with fever increased cough and shortness of breath and had several courses of antibiotic therapy with improvement.   He tested negative for Covid.  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 At evaluation with me in November 2023, blood pressure was elevated and amlodipine was titrated to 5 mg and he was started on 12.5 mg of HCTZ to take  every other day for leg swelling.  Laboratory on September 09, 2022 showed total cholesterol 135, LDL cholesterol 38, triglycerides 72 and HDL 83.  When seen in May 2024 blood pressure remained stable on  amlodipine 5 mg, furosemide 20 mg daily, telmisartan 80 mg, metoprolol succinate 25 mg and he takes HCTZ 12.5 mg as needed for swelling.  He continues to be on Repatha and rosuvastatin 40 mg for hyperlipidemia.  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  Presently, he tells me due to dizziness he stopped amlodipine 2 months ago.  His blood pressure today when taken by me is excellent without orthostatic change with supine blood pressure 116/74 and standing blood pressure 116/78.  He is on hydrochlorothiazide 12.5 mg which he takes every other day or as needed depending  upon ankle swelling.  He continues to be on telmisartan 80 mg.  Lipids remain excellent on rosuvastatin 40 mg, Zetia 10 mg, and Repatha injections.  Laboratory in August 17, 2023 showed total cholesterol 120 HDL 70 LDL 32 and triglycerides 97.  Renal function was stable with creatinine 1.15.  TSH is normal at 2.9.  He will continue to monitor his blood pressure at home and if systolic blood pressure consistently is greater than 135 I suggested possible reinstitution of amlodipine at reduced dose 2.5 mg.  He will be following with a new provider at [facility].  I will see him in 6 months for reevaluation.  Lennette Bihari, MD, [facility] 09/26/2023 11:05 AM

## 2023-09-26 ENCOUNTER — Encounter: Payer: Self-pay | Admitting: Cardiovascular Disease

## 2023-10-14 ENCOUNTER — Other Ambulatory Visit: Payer: Self-pay | Admitting: Cardiovascular Disease

## 2023-10-21 ENCOUNTER — Other Ambulatory Visit (HOSPITAL_COMMUNITY): Payer: Self-pay

## 2023-10-21 ENCOUNTER — Other Ambulatory Visit: Payer: Self-pay | Admitting: Pharmacist

## 2023-10-21 MED ORDER — REPATHA SURECLICK 140 MG/ML ~~LOC~~ SOAJ: 1.0000 | SUBCUTANEOUS | 11 refills | Status: DC

## 2023-10-21 MED ORDER — REPATHA SURECLICK 140 MG/ML ~~LOC~~ SOAJ
1.0000 | SUBCUTANEOUS | 11 refills | Status: DC
  Filled 2023-10-21: qty 2, 28d supply, fill #0

## 2023-11-29 ENCOUNTER — Other Ambulatory Visit: Payer: Self-pay | Admitting: Cardiovascular Disease

## 2023-12-06 ENCOUNTER — Other Ambulatory Visit: Payer: Self-pay

## 2023-12-06 ENCOUNTER — Other Ambulatory Visit: Payer: Self-pay | Admitting: Cardiovascular Disease

## 2023-12-06 MED ORDER — ROSUVASTATIN CALCIUM 40 MG PO TABS: 40.0000 mg | ORAL_TABLET | Freq: Every day | ORAL | 3 refills | Status: DC

## 2023-12-22 ENCOUNTER — Other Ambulatory Visit: Payer: Self-pay

## 2023-12-22 MED ORDER — EZETIMIBE 10 MG PO TABS: 10.0000 mg | ORAL_TABLET | Freq: Every day | ORAL | 3 refills | Status: AC

## 2024-03-05 ENCOUNTER — Other Ambulatory Visit: Payer: Self-pay | Admitting: Cardiovascular Disease

## 2024-04-11 ENCOUNTER — Encounter: Payer: Self-pay | Admitting: Cardiovascular Disease

## 2024-04-11 NOTE — Telephone Encounter (Signed)
 Please assist pt.

## 2024-04-13 NOTE — Telephone Encounter (Signed)
 Spoke with pt regarding appointment availability. Pt able to schedule for Monday 6/9 at 10:40am. Pt verbalizes understanding.

## 2024-04-13 NOTE — Telephone Encounter (Signed)
 Patient is following up, insisting on speaking directly with Dr. Eda Gone regarding being seen. He says Dr. Loetta Ringer previously informed him that he could be seen before the end of July. Please advise.

## 2024-04-17 ENCOUNTER — Ambulatory Visit: Attending: Cardiovascular Disease | Admitting: Cardiovascular Disease

## 2024-04-17 ENCOUNTER — Encounter: Payer: Self-pay | Admitting: Cardiovascular Disease

## 2024-04-17 VITALS — BP 126/74 | HR 72 | Ht 67.0 in | Wt 218.0 lb

## 2024-04-17 DIAGNOSIS — E7849 Other hyperlipidemia: Secondary | ICD-10-CM

## 2024-04-17 DIAGNOSIS — I440 Atrioventricular block, first degree: Secondary | ICD-10-CM

## 2024-04-17 DIAGNOSIS — R600 Localized edema: Secondary | ICD-10-CM

## 2024-04-17 DIAGNOSIS — I10 Essential (primary) hypertension: Secondary | ICD-10-CM

## 2024-04-17 DIAGNOSIS — Z951 Presence of aortocoronary bypass graft: Secondary | ICD-10-CM

## 2024-04-17 DIAGNOSIS — I2510 Atherosclerotic heart disease of native coronary artery without angina pectoris: Secondary | ICD-10-CM | POA: Diagnosis not present

## 2024-04-17 NOTE — Progress Notes (Signed)
 Patient ID: [name] [name], male   DOB: Jan 09, 1946, 78 y.o.   MRN: [id number]       HPI:  [name] [name] is a 78 y.o. male who is a former patient of Dr. Ed Gondola.  He presents for 7 month follow-up evaluation.  [name] [name] has a long-standing history of familial hyperlipidemia.  His father and one of his brothers had familial hyperlipidemia.   His father died at age 32 secondary to advanced coronary artery disease.  [name] [name] cholesterols in the past had been as high as 400.  He was initially diagnosed in the 1970s.  He has participated in numerous drug studies over the years and was one of the precipitants in the initial statin trials with Mevacor.  As part of one of his studies, he underwent diagnostic cardiac catheterization in [place] .  He was asymptomatic with chest pain but was found to have mild coronary obstructive disease with 20-40% RCA stenoses, 20-30% circumflex stenoses,  At least moderate LAD stenosis with poststenotic dilatation involving the diagonal and septal perforating artery in 1987.  He has not been on medical therapy for CAD, with the exception of aggressive lipid-lowering treatment.  His last nuclear perfusion study was in 2012, which continue to show normal perfusion.  There was attenuation artifact.  Post-rest ejection fraction was 53%. In June 2014 an echo Doppler study  showed an ejection fraction of 55-60%; Normal diastolic parameters.  He had mild left atrial dilatation.  There was mild aortic valve sclerosis without stenosis.  The patient tells me he has been aggressive with his follow-up.  He typically has laboratory checked via life extension on his own. Remotely, he also underwent VAP testing to determine particles size and numbers.  H e has been on Zetia  10 mg Crestor  40 mg, Niaspan  2000 mg and 2000 mg of omega-3 fatty acids.  I did review blood work that he had had in April 2015 from life extension and at that time on aggressive medical therapy.  His total  cholesterol was 185, triglycerides 59, HDL cholesterol 82, LDL cholesterol 91.  Homocysteine was 11.7.  Hemoglobin A1c was 5.7.  Thyroid  function studies were normal.  He was not anemic.  He had normal renal function.  He participated in this study looking at possible side effects from the injection site for PCSK9 inhibitor therapy.  He tells me his brother  underwent stenting of his coronary artery age 36.  The patient also has a history of gout, and hypertension.  He has been tolerating my Micardis  80 mg without side effects.  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  He denies chest pain.  He denies PND, orthopnea.  He denies palpitations.  In February 2016 he had an NMR LipoProfile.  Total cholesterol was 185, triglycerides 72, HDL 94, LDL C 77, and LDL particle number was excellent at 683.  Insulin  resistance score was 31.  In March 2016  a nuclear perfusion study continued  to show normal perfusion and was low risk with probable bowel attenuation artifact.  As part of his own blood testing he had another lipid panel in August 2016 which showed a total cholesterol 140, HDL 50, LDL 77, and very normal triglyceride levels.  He had purposeful weight loss of 30 pounds.  He tells me he had undergone hip surgery by Dr. Myrna Ast in [place], [place] .  He underwent an NMR LipoProfile on 12/20/2015.  Total cholesterol is 153, HDL 76, triglycerides 45, and calculated LDL was 68.  He had 1241, LDL particles which were predominantly large.  There were only 196 small LDL particles.  April lipoprotein B was normal at 61 as was LPa at 14.  He again had his labs rechecked as part of life extension, blood testing in June 2017.  He has a remote history of gout and has been taking a you.  All and uric acid was 3.3.  Renal function was normal.  He had normal LFTs.  Lipid studies revealed a total cholesterol of 167, triglycerides 46, HDL 85, and LDL 73.  Homocysteine was normal at 9.5.  PSA was 0.8.   Testosterone was low at 297.  Hemoglobin A1c was excellent at 5.3.  TSH was normal.  He was not anemic with hemoglobin of 13.5, hematocrit of 42.3.  MCV was minimally increased at 102.  Vitamin D level was normal at 95.7.  He had been taking vitamin D 10,000 units daily supplementation  Since I last saw him one year ago, he has remained active.  Over the past year he remains active.  He plays golf at least 2 days per week.  days a week.  He walks several other days.  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  He is widowed and not sexually active.   He underwent follow-up laboratory on 04/14/2017 at life extension.  BUN 16, creatinine 0.96.  Total cholesterol 189, triglycerides 71, HDL 89, LDL 86.  Homocysteine 14.5.  C-reactive protein 2.1.  Hemoglobin 14.1/hematocrit 42.8.  Serum testosterone 196.  TSH 2.6.  Vitamin D 50.2.  Apo lipoprotein B 81.    When I  saw him in December 2018, I had a long discussion with reference to PCSK 9 inhibition in light of his familial hyperlipidemia.  He has since been started on Praluent  150 mg every 2-week injections and had received 2 months of treatment.  Follow-up laboratory 2 days ago has shown a total cholesterol of 139, triglycerides 66, HDL 87, and LDL 39.  He has not had any side  effects to the medication.  His insurance company had denied Repatha .  He continues to be on concomitant therapy with Zetia  10 mg and rosuvastatin  40 mg.  He also is on Toprol -XL 25 mg and telmisartan  80 mg for hypertension.  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  He recently returned from a two-week trip to [place]  to visit his son who lives there.    I saw him in April 2019.  He continued to be asymptomatic with reference to chest pain, and did not have any change in exercise tolerance.  Lipid studies at that time revealed a total cholesterol 139, triglycerides 66, HDL 87 and LDL cholesterol was 39.  On his own, he underwent laboratory by life extension in  August 2019.  Chemistry was normal.  Fasting glucose was 83.  Total cholesterol 114, triglycerides 42, HDL 81, and LDL was 25.  Homocystine was normal.  Vitamin D level was 63.2.  Insulin  level was normal at 5.7.  Apo lipoprotein B was excellent at 32.  Life line screening screening was done by his own accord which he typically gets done every several years.  This now has shown improvement from previous evaluations with carotids now being totally normal without previous plaque.  His abdominal aorta was normal, there was no PVD.  Remains active.  He plays golf 2 times per week at a minimum.  He admits to weight gain over the holidays.   When I  saw him in the office in January 2020, he was remaining fairly stable.  However, over the past 6 months he has noticed some mild gradual increase in exertional shortness of breath.  He saw his primary physician, Dr. Allan Ishihara and due to his exertional component of dyspnea he referred him back to our office and he saw Dr. Pinky Bright on July 10, 2019 for further evaluation.  An echo Doppler study on July 11, 2019 revealed EF at 60 to 65% with grade 1 diastolic dysfunction.  He was referred for a nuclear perfusion study which was done with a Lexiscan  protocol and this showed clear change from his prior evaluations and was interpreted as high risk with transient ischemic dilatation, EF 39%, and ischemia in several distributions. There was significant discussion with Dr. Lucy Sack and an attempt at scheduling him for coronary CTA was unsuccessful.  After much discussion the decision was made to proceed with cardiac catheterization which initially the patient had some reservation.  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  Apparently his angiographic data was reviewed at [facility] and decision was not to proceed with intervention or bypass at that  time and he has been on medical therapy for over 30 years.    I saw him for telemedicine evaluation on August 30, 2019 prior to undergoing his cardiac catheterization which was scheduled for the following day.  At that time, he told me that since he was  notified of his abnormal results on his own he increased aspirin  from 81 mg up to 325 mg and he had old prescription of Plavix which had been prescribed by Dr. Ed Gondola approximately 8 years ago.  For the past several weeks he put himself back on his oral Plavix medication and he stated that he had checked on the Internet and felt that even though it may not be as potent as it may have been when initially prescribed to him he felt that this provided some additional coverage until his catheterization.  Cardiac catheterization was performed by me on August 31 2019.  This revealed severe native CAD with 80% calcified distal left main stenosis, 99% ostial LAD stenosis with total occlusion of the LAD after the proximal diagonal and septal perforating artery, 80% ostial circumflex stenosis followed by 80% proximal stenosis with extensive collateralization to a dominant RCA from the circumflex as well as faint collaterals to the distal LAD and total proximal RCA occlusion.  He underwent urgent surgical consultation and on September 01, 2019 he underwent successful CABG revascularization with a LIMA to the LAD, vein to the diagonal 1 and OM 2 and PLV with endoscopic vein harvesting from the right lower extremity.  He received IV Lasix  for fluid overload.  He was discharged on September 06, 2019.  During his hospitalization, hydrochlorothiazide , Plavix, and telmisartan  were discontinued prior to discharge due to bradycardia and hypotension.  He was evaluated by Margherita Shell on September 18, 2019 and had URI symptoms and fever.  A chest x-ray did not reveal pneumonia and he tested negative for Covid, flu, and srep.  He has subsequently been evaluated by a physician  and his primary care's office and has undergone several courses of antibiotics including Augmentin and doxycycline, but most recently levofloxacin.  His fever has resolved and he is feeling better but he still admits to occasional cough.   I saw him for his  initial post hospital evaluation with me on September 29, 2019.  He has not had any pulmonary evaluation.  From a cardiac perspective, he admits to residual swelling of his right lower extremity.  He had been on Lasix  40 mg daily but this was discontinued approximately 3 weeks ago after he had seen Dr. Deloise Ferries who performed his CABG revascularization surgery.  He continues to be on Praluent  150 mg every 2 weeks, rosuvastatin  40 mg and Zetia  10 mg for his hereditary hyperlipidemia.  He is unaware of palpitations.  He denies presyncope or syncope.  During that evaluation, he had tense lower extremity edema.  I recommended reinstitution of furosemide  initially at 20 mg for the next 5 to 7 days then potentially as needed.  I change his metoprolol  to succinate 25 mg daily.  He was on Praluent  150 mg, rosuvastatin  and Zetia  for familial hyperlipidemia.  I saw him on September 12, 2020.  Since his prior evaluation he has remained stable. He had significant benefit from initial furosemide  and now has been taking 20 mg 2 times per week.  However he still experiences mild right lower edema.  Since his CABG revascularization, he notes improvement in previous exertional dyspnea.  He had never experienced significant chest pressure.  He has been participating in hard strides cardiac rehab at [facility] which will be for 3 months duration.  He underwent repeat laboratory on January 01, 2020.  Total cholesterol is now 115, triglycerides 82, HDL 80, and LDL cholesterol 19 on Praluent , high potency statin and Zetia  therapy.  He denies any palpitations.  He denies presyncope or syncope.    I saw him in a 2-year follow-up evaluation on September 09, 2022. Since I last saw him,  he has remained fairly stable.  He is working in [employer] in [employer].  He typically plays golf several times per week and goes to the gym 2 times per week doing both strength and balance.  At times he has noted some occasional leg swelling right greater than left.  He also admits to GERD.  He is now followed by Dr. Langston Pippins at [facility] for primary care.  He tells me his blood pressure oftentimes may be in the 140-150 range.  He continues to be on amlodipine  at low-dose 2.5 mg, and has been taking as needed hydrochlorothiazide  or furosemide .  He is on metoprolol  succinate 25 mg daily and telmisartan  80 mg.  He continues to be on Praluent  150 mg/mL every 2 weeks, rosuvastatin  40 mg, and Zetia  10 mg.  During that evaluation, his blood pressure was elevated and I recommended he increase amlodipine  to 5 mg from his dose of 2.5 mg and suggested he initiate HCTZ at 12.5 mg every other day depending upon leg swelling and if this continues to increase to daily.  Follow-up laboratory was recommended as well as a future echo Doppler assessment.  He underwent an echo Doppler study on Mar 10, 2023.  This revealed EF at 55 to 60% without wall motion abnormalities.  Strain was normal.  There was mild LVH.  He had normal pulmonary artery systolic pressure with estimated RV pressure 28 mm.  There was trivial MR.  He had mild to moderate PR.  There is mild dilation of his aortic root at 44 mm with dilation of ascending aorta at 42 mm.  When I saw him on Mar 16, 2023 [name] [name] remained asymptomatic without chest pain or shortness of breath.  Since Dr. Ernestina Headland Little's retirement  he has seen an [facility] physician with on 1 occasion.  He denies chest pain or shortness of breath.  He is unaware of palpitations.  He had had some reflux issues and had undergone endoscopy which was essentially normal.  He presents for evaluation.  I last saw him on September 21, 2023 at which time he denied any chest pain or shortness of breath.   Approximately 2 months ago he was experiencing some mild dizziness and stopped his amlodipine .  At times he notices some trace right ankle edema.  He takes hydrochlorothiazide  12.5 mg on an as needed basis.  He is on with metoprolol  succinate 25 mg daily, telmisartan  80 mg daily, and continues to be on Zetia  10 mg, rosuvastatin  40 mg, and Repatha  140 mg every 2 weeks.   Since his prior evaluation, [name] [name] has remained stable.  He is unaware of any chest pain.  He denies palpitations.  He denies any exertional dyspnea.  He continues to be on rosuvastatin  40 mg and Repatha  for lipid management with most recent laboratory on August 17, 2023 showing total cholesterol 120, HDL 70, LDL 32, and triglycerides 97.  He takes amlodipine  5 mg, as needed hydrochlorothiazide , metoprolol  succinate 25 mg and telmisartan  80 mg daily.  He presents for evaluation.   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   left post shoulder-sup (CX35FU), left post leg    Coronary artery disease    Gout    Of the left third, fourth, and fifth MTPareas with swelling of left foot and pain   Hyperlipidemia    Hypertension     Past Surgical History:  Procedure Laterality Date   CARDIAC CATHETERIZATION  01/04/1986   CORONARY ARTERY BYPASS GRAFT N/A 09/01/2019   Procedure: CORONARY ARTERY BYPASS GRAFTING (CABG) x Four , using left internal mammary artery and right leg greater saphenous vein harvested endoscopically;  Surgeon: Hilarie Lovely, MD;  Location: [facility] OR;  Service: Open Heart Surgery;  Laterality: N/A;   LEFT HEART CATH AND CORONARY ANGIOGRAPHY N/A 08/31/2019   Procedure: LEFT HEART CATH AND CORONARY ANGIOGRAPHY;  Surgeon: Millicent Ally, MD;  Location: [facility] INVASIVE CV LAB;  Service: Cardiovascular;  Laterality: N/A;   TEE WITHOUT CARDIOVERSION N/A 09/01/2019   Procedure: Transesophageal Echocardiogram  (Tee);  Surgeon: Hilarie Lovely, MD;  Location: [facility] OR;  Service: Open Heart Surgery;  Laterality: N/A;    No Known Allergies  Current Outpatient Medications  Medication Sig Dispense Refill   amLODipine  (NORVASC ) 5 MG tablet TAKE 1 TABLET BY MOUTH DAILY 90 tablet 3   aspirin  EC 81 MG tablet Take 81 mg by mouth daily.     Azelastine HCl 0.15 % SOLN Place 1 spray into the nose as needed.     Evolocumab  (REPATHA  SURECLICK) 140 MG/ML SOAJ Inject 140 mg into the skin every 14 (fourteen) days. 2 mL 11   ezetimibe  (ZETIA ) 10 MG tablet Take 1 tablet (10 mg total) by mouth daily. 90 tablet 3   fluticasone  (FLONASE ) 50 MCG/ACT nasal spray daily.     furosemide  (LASIX ) 20 MG tablet Take 20 mg by mouth as needed.     hydrochlorothiazide  (MICROZIDE ) 12.5 MG capsule TAKE 1 CAPSULE BY MOUTH EVERY OTHER DAY TO EVERY THIRD DAY DEPENDING ON SWELLING AND BLOOD PRESSURE 45 capsule 2   metoprolol  succinate (TOPROL -XL) 25 MG 24 hr tablet TAKE 1  TABLET BY MOUTH EVERY DAY 90 tablet 1   Oxymetazoline  HCl (NASAL SPRAY) 0.05 % SOLN Place 1 spray into the nose daily as needed (congestion).     rosuvastatin  (CRESTOR ) 40 MG tablet Take 1 tablet (40 mg total) by mouth daily. 90 tablet 3   telmisartan  (MICARDIS ) 80 MG tablet TAKE 1 TABLET BY MOUTH DAILY 90 tablet 3   No current facility-administered medications for this visit.    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  He still goes to several board meetingS .  t Workhroughout the year he is married and has 2 children, ages 79 and 47.  He has 2 grandchildren.  There is no history of tobacco use.  He does not drink alcohol.  Family history is notable that his mother died at age 38 with old age.  Father died at age 68 with advanced atherosclerosis.  He has one brother who is status post coronary stenting.  ROS General: Negative; No fevers, chills, or night sweats HEENT: Negative; No changes in vision or  hearing, sinus congestion, difficulty swallowing Pulmonary: Negative; No cough, wheezing, shortness of breath, hemoptysis Cardiovascular:  See HPI; No chest pain, presyncope, syncope, palpitations,  Hives he may note some trace ankle swelling GI: Negative; No nausea, vomiting, diarrhea, or abdominal pain GU: Negative; No dysuria, hematuria, or difficulty voiding Musculoskeletal: Negative; no myalgias, joint pain, or weakness Hematologic/Oncologic: Negative; no easy bruising, bleeding Neurologic: Positive for gout Endocrine: Negative; no heat/cold intolerance; no diabetes Neuro: Negative; no changes in balance, headaches Skin: Osteophytic for chronic rash  Psychiatric: Negative; No behavioral problems, depression Sleep: Negative; No daytime sleepiness, hypersomnolence, bruxism, restless legs, hypnogagnic hallucinations Other comprehensive 14 point system review is negative   Physical Exam BP 126/74   Pulse 72   Ht 5\' 7"  (1.702 m)   Wt 218 lb (98.9 kg)   SpO2 94%   BMI 34.14 kg/m    Repeat blood pressure by me was 116/74  Wt Readings from Last 3 Encounters:  04/17/24 218 lb (98.9 kg)  09/21/23 217 lb 6.4 oz (98.6 kg)  03/16/23 215 lb 12.8 oz (97.9 kg)   General: Alert, oriented, no distress.  Skin: normal turgor, no rashes, warm and dry HEENT: Normocephalic, atraumatic. Pupils equal round and reactive to light; sclera anicteric; extraocular muscles intact;  Nose without nasal septal hypertrophy Mouth/Parynx benign; Mallinpatti scale 3 Neck: No JVD, no carotid bruits; normal carotid upstroke Lungs: clear to ausculatation and percussion; no wheezing or rales Chest wall: without tenderness to palpitation Heart: PMI not displaced, RRR, s1 s2 normal, 1/6 systolic murmur, no diastolic murmur, no rubs, gallops, thrills, or heaves Abdomen: soft, nontender; no hepatosplenomehaly, BS+; abdominal aorta nontender and not dilated by palpation. Back: no CVA tenderness Pulses  2+ Musculoskeletal: full range of motion, normal strength, no joint deformities Extremities: no clubbing cyanosis or edema, Homan's sign negative  Neurologic: grossly nonfocal; Cranial nerves grossly wnl Psychologic: Normal mood and affect  EKG Interpretation Date/Time:  Monday April 17 2024 11:01:03 EDT Ventricular Rate:  72 PR Interval:  220 QRS Duration:  96 QT Interval:  420 QTC Calculation: 459 R Axis:   -23  Text Interpretation: Sinus rhythm with 1st degree A-V block with occasional Premature ventricular complexes When compared with ECG of 21-Sep-2023 10:02, Premature ventricular complexes are now Present Nonspecific T wave abnormality, improved in Inferior leads T wave inversion no longer evident in Anterior leads Confirmed by Magnus Schuller ([id number]) on 04/17/2024  4:39:00 PM      September 21, 2023 ECG (independently read by me):EKG Interpretation Date/Time:  Monday April 17 2024 11:01:03 EDT Ventricular Rate:  72 PR Interval:  220 QRS Duration:  96 QT Interval:  420 QTC Calculation: 459 R Axis:   -23  Text Interpretation: Sinus rhythm with 1st degree A-V block with occasional Premature ventricular complexes When compared with ECG of 21-Sep-2023 10:02, Premature ventricular complexes are now Present Nonspecific T wave abnormality, improved in Inferior leads T wave inversion no longer evident in Anterior leads Confirmed by Magnus Schuller ([id number]) on 04/17/2024 4:39:00 PM  Normal sinus rhythm at 69 with first-degree AV block, PR interval 224 ms.   Mar 16, 2023 ECG (independently read by me): NSR at 86, IRBBB, T wave abnormality V1-3    September 09, 2022 ECG (independently read by me): NSR at 61, IRBBB1st degree block, PR 234 msec.  January 02, 2020 ECG (independently read by me): Normal sinus rhythm at 81 bpm.  No ectopy.  Normal intervals.  Mild RV conduction delay   September 29, 2019 ECG (independently read by me): Normal sinus rhythm at 70 bpm.  Borderline first-degree AV block with a  PR level 206 ms.  Nonspecific T wave abnormality in V1 and V2  January 2020 ECG (independently read by me): Sinus rhythm at 81 bpm.  Mild first-degree AV block with a PR of 206 ms.  Nonspecific T changes.  02/11/2018 ECG (independently read by me): Sinus rhythm at 60 bpm.  First-degree AV block.  No ectopy.  December 2018 ECG (independently read by me): Normal sinus rhythm at 72 bpm.  Early transition.  First-degree AV block with a PR interval of 206 ms.  October 2016 ECG (independently read by me): Normal sinus rhythm at 62.  No ectopy.  No ST segment changes.  ECG (independently read by me): Normal sinus rhythm at 71 bpm.  No ectopy.  QTc interval 441 ms  LABS:    Latest Ref Rng & Units 07/23/2023    9:18 AM 09/09/2022   11:53 AM 10/08/2020   11:16 AM  BMP  Glucose 70 - 99 mg/dL 93  80  97   BUN 8 - 27 mg/dL 12  13  14    Creatinine 0.76 - 1.27 mg/dL 1.51  7.61  6.07   BUN/Creat Ratio 10 - 24 10  12  12    Sodium 134 - 144 mmol/L 139  143  141   Potassium 3.5 - 5.2 mmol/L 4.9  4.7  4.8   Chloride 96 - 106 mmol/L 99  100  102   CO2 20 - 29 mmol/L 27  24  28    Calcium  8.6 - 10.2 mg/dL 9.5  9.6  9.5       Latest Ref Rng & Units 07/23/2023    9:18 AM 09/09/2022   11:53 AM 10/08/2020   11:16 AM  Hepatic Function  Total Protein 6.0 - 8.5 g/dL 6.7  7.5  6.5   Albumin  3.8 - 4.8 g/dL 4.6  5.0  4.5   AST 0 - 40 IU/L 26  31  25    ALT 0 - 44 IU/L 18  23  18    Alk Phosphatase 44 - 121 IU/L 62  57  54   Total Bilirubin 0.0 - 1.2 mg/dL 0.6  0.5  0.8       Latest Ref Rng & Units 07/23/2023    9:18 AM 09/09/2022   11:53 AM 10/08/2020   11:16 AM  CBC  WBC 3.4 - 10.8 x10E3/uL 8.5  6.6  5.1   Hemoglobin 13.0 - 17.7 g/dL 16.1  09.6  04.5   Hematocrit 37.5 - 51.0 % 41.2  42.4  39.9   Platelets 150 - 450 x10E3/uL 189  177  142    Lab Results  Component Value Date   MCV 100 (H) 07/23/2023   MCV 98 (H) 09/09/2022   MCV 100 (H) 10/08/2020   Lab Results  Component Value Date   TSH 2.950  07/23/2023   Lipid Panel     Component Value Date/Time   CHOL 135 07/23/2023 0918   CHOL 153 12/20/2015 0949   TRIG 71 07/23/2023 0918   TRIG 45 12/20/2015 0949   TRIG 72 12/06/2014 0944   HDL 84 07/23/2023 0918   HDL 76 12/20/2015 0949   HDL 94 12/06/2014 0944   CHOLHDL 1.6 07/23/2023 0918   CHOLHDL 2.0 12/20/2015 0949   LDLCALC 37 07/23/2023 0918   LDLCALC 68 12/20/2015 0949   RADIOLOGY: No results found.  CARDIAC STUDIES: Ost RCA to Prox RCA lesion is 100% stenosed. Dist LM to Prox LAD lesion is 99% stenosed. Ost Cx lesion is 80% stenosed. Prox LAD to Mid LAD lesion is 100% stenosed. Mid LM lesion is 80% stenosed. Prox Cx lesion is 80% stenosed.   Severe  native coronary artery disease with 80% calcified distal left main stenosis; 99% ostial LAD stenosis with total occlusion of the LAD after the proximal diagonal and septal perforating artery; 80% ostial circumflex stenosis followed by 80% proximal stenosis with extensive collateralization to a dominant RCA from the circumflex vessel as well as faint collaterals to the distal LAD, and total proximal RCA occlusion.   LVEDP 14 mm   RECOMMENDATION:  Urgent Surgical consultation will be obtained for CABG revascularization.  A stat P2Y12 blood test was sent from the laboratory to assess the antiplatelet effect of his self prescribed 8 years outdated Plavix with his last dose 2 days ago.      IMPRESSION:  1. Coronary artery disease involving native coronary artery of native heart without angina pectoris   2. 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 Deloise Ferries with LIMA to LAD, SVGs to diagonal 1, OM 2 and PLV   3. Familial hyperlipidemia   4. Essential hypertension   5. First degree heart block   6. Lower extremity edema     ASSESSMENT AND PLAN: Mr. Taje Littler is a 78 year-old gentleman who has a history of heterozygous familial hyperlipidemia and was initially noted to have cholesterols in excess of 400 in the 1970s.   His father and brother also has similar history.  His 2 children do not have this disorder.  He has been on aggressive lipid-lowering therapy ever since statins were initially introduced and had participated in the early clinical trials of lovastatin and simvastatin.  He had previously been demonstrated to have coronary atherosclerosis by cardiac catheterization in the 1980s.  In 2016 prior to undergoing hip surgery a four-year follow-up nuclear perfusion study continued to be low risk and did not reveal any significant perfusion abnormalities.  His familial hyperlipidemia had been treated with Praluent  150 mg every 14 days, rosuvastatin  40 mg, Zetia  10 mg in addition to omega-3 fatty acid.  When I had  seen him I discontinued niacin .  Lipid studies were outstanding and most recent evaluation showed a total cholesterol 114 with an LDL cholesterol of 25.  Triglycerides were 42 with HDL 81.  Apo  lipoprotein B was 32.  Over the late spring and early summer of 2020, [name] [name] developed progressive exertional dyspnea.  An echo Doppler study on September 1,2020 showed an EF at 60 to 65%.  A nuclear perfusion study was very high risk and EF post-rest was 39% and there was evidence for transient ischemic dilatation with ischemia in several vascular distributions.  Cardiac catheterization revealed life-threatening anatomy as noted above and the following day he underwent urgent coronary revascularization by Dr. Deloise Ferries successfully.  Following his surgery, he had had issues with fever increased cough and shortness of breath and had several courses of antibiotic therapy with improvement.   He tested negative for Covid.  When I evaluated him for his initial post CABG evaluation he had significant tense lower extremity edema which significantly improved with Lasix  initially 20 mg daily. At evaluation with me in November 2023, blood pressure was elevated and amlodipine  was titrated to 5 mg and he was started on 12.5 mg of  HCTZ to take every other day for leg swelling.  Laboratory on September 09, 2022 showed total cholesterol 135, LDL cholesterol 38, triglycerides 72 and HDL 83.  When seen in May 2024 blood pressure remained stable on  amlodipine  5 mg, furosemide  20 mg daily, telmisartan  80 mg, metoprolol  succinate 25 mg and he was taking HCTZ 12.5 mg as needed for swelling.  He continues to be on Repatha  and rosuvastatin  40 mg for hyperlipidemia.  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  Presently, he tells me due to dizziness he stopped amlodipine  2 months ago.  His blood pressure today when taken by me is excellent without orthostatic change with supine blood pressure 116/74 and standing blood pressure 116/78.  He is on hydrochlorothiazide  12.5 mg which he takes every other day or as needed depending upon ankle swelling.  He continues to be on telmisartan  80 mg.  Lipids remain excellent on rosuvastatin  40 mg, Zetia  10 mg, and Repatha  injections.  Laboratory in August 17, 2023 showed total cholesterol 120 HDL 70 LDL 32 and triglycerides 97.  Renal function was stable with creatinine 1.15.  TSH is normal at 2.9.  He will continue to monitor his blood pressure at home and if systolic blood pressure consistently is greater than 135 I suggested possible reinstitution of amlodipine  at reduced dose 2.5 mg.  He will be following with a new provider at [facility].  Blood pressure today is excellent at 116/74 on amlodipine  5 mg and telmisartan  80 mg in addition to metoprolol  succinate 25 mg.  He only takes HCTZ or furosemide  on a as needed basis.  Lipid studies are excellent" addition to with most recent LDL cholesterol 132 on August 17, 2023.  ECG today is stable with sinus rhythm and first-degree AV block with 1 isolated PVC.  He is aware of my imminent retirement.  I  will transition him to the cardiology care of Dr. Randene Bustard with plan follow-up in 7 to 8 months or sooner if necessary.  Millicent Ally, MD, [facility] 04/17/2024 4:49 PM

## 2024-04-17 NOTE — Patient Instructions (Signed)
 Medication Instructions:  Your physician recommends that you continue on your current medications as directed. Please refer to the Current Medication list given to you today.  *If you need a refill on your cardiac medications before your next appointment, please call your pharmacy*  Lab Work: None ordered  If you have labs (blood work) drawn today and your tests are completely normal, you will receive your results only by: MyChart Message (if you have MyChart) OR A paper copy in the mail If you have any lab test that is abnormal or we need to change your treatment, we will call you to review the results.  Testing/Procedures: None ordered  Follow-Up: At [facility], you and your health needs are our priority.  As part of our continuing mission to provide you with exceptional heart care, our providers are all part of one team.  This team includes your primary Cardiologist (physician) and Advanced Practice Providers or APPs (Physician Assistants and Nurse Practitioners) who all work together to provide you with the care you need, when you need it.  Your next appointment:   8 month(s)  Provider:   Randene Bustard, MD    We recommend signing up for the patient portal called "MyChart".  Sign up information is provided on this After Visit Summary.  MyChart is used to connect with patients for Virtual Visits (Telemedicine).  Patients are able to view lab/test results, encounter notes, upcoming appointments, etc.  Non-urgent messages can be sent to your provider as well.   To learn more about what you can do with MyChart, go to [email or web address].   Other Instructions

## 2024-05-26 ENCOUNTER — Other Ambulatory Visit: Payer: Self-pay | Admitting: Cardiovascular Disease

## 2024-07-07 DIAGNOSIS — R399 Unspecified symptoms and signs involving the genitourinary system: Secondary | ICD-10-CM | POA: Diagnosis not present

## 2024-08-23 DIAGNOSIS — E785 Hyperlipidemia, unspecified: Secondary | ICD-10-CM | POA: Diagnosis not present

## 2024-08-23 DIAGNOSIS — R718 Other abnormality of red blood cells: Secondary | ICD-10-CM | POA: Diagnosis not present

## 2024-08-23 DIAGNOSIS — R809 Proteinuria, unspecified: Secondary | ICD-10-CM | POA: Diagnosis not present

## 2024-08-23 DIAGNOSIS — I700 Atherosclerosis of aorta: Secondary | ICD-10-CM | POA: Diagnosis not present

## 2024-08-23 DIAGNOSIS — N1831 Chronic kidney disease, stage 3a: Secondary | ICD-10-CM | POA: Diagnosis not present

## 2024-08-23 DIAGNOSIS — R5383 Other fatigue: Secondary | ICD-10-CM | POA: Diagnosis not present

## 2024-08-23 DIAGNOSIS — I10 Essential (primary) hypertension: Secondary | ICD-10-CM | POA: Diagnosis not present

## 2024-08-23 DIAGNOSIS — I2510 Atherosclerotic heart disease of native coronary artery without angina pectoris: Secondary | ICD-10-CM | POA: Diagnosis not present

## 2024-08-30 ENCOUNTER — Other Ambulatory Visit: Payer: Self-pay

## 2024-09-01 MED ORDER — AMLODIPINE BESYLATE 5 MG PO TABS: 5.0000 mg | ORAL_TABLET | Freq: Every day | ORAL | 2 refills | Status: AC

## 2024-10-02 ENCOUNTER — Other Ambulatory Visit: Payer: Self-pay | Admitting: Pharmacist

## 2024-10-02 MED ORDER — REPATHA SURECLICK 140 MG/ML ~~LOC~~ SOAJ: 1.0000 | SUBCUTANEOUS | 11 refills | Status: AC

## 2024-10-12 ENCOUNTER — Other Ambulatory Visit: Payer: Self-pay | Admitting: Cardiology

## 2024-10-18 MED ORDER — TELMISARTAN 80 MG PO TABS: 80.0000 mg | ORAL_TABLET | Freq: Every day | ORAL | 1 refills | Status: AC

## 2024-12-05 ENCOUNTER — Other Ambulatory Visit: Payer: Self-pay

## 2024-12-11 MED ORDER — ROSUVASTATIN CALCIUM 40 MG PO TABS: 40.0000 mg | ORAL_TABLET | Freq: Every day | ORAL | 1 refills | Status: AC

## 2024-12-11 NOTE — Telephone Encounter (Signed)
 Lipids are overdue.

## 2024-12-12 ENCOUNTER — Other Ambulatory Visit: Payer: Self-pay

## 2024-12-14 MED ORDER — HYDROCHLOROTHIAZIDE 12.5 MG PO CAPS: ORAL_CAPSULE | ORAL | 0 refills | Status: AC

## 2024-12-14 NOTE — Telephone Encounter (Signed)
 Overdue Labs. Pt can get them done at next O/V

## 2025-02-16 ENCOUNTER — Ambulatory Visit: Admitting: Cardiology
